# Patient Record
Sex: Male | Born: 1965 | Race: Black or African American | Hispanic: No | Marital: Married | Smoking: Former smoker
Health system: Southern US, Community
[De-identification: ages and names within clinical notes are randomized; demographics above are authoritative.]

## PROBLEM LIST (undated history)

## (undated) DIAGNOSIS — Z86018 Personal history of other benign neoplasm: Secondary | ICD-10-CM

## (undated) DIAGNOSIS — Z8669 Personal history of other diseases of the nervous system and sense organs: Secondary | ICD-10-CM

## (undated) DIAGNOSIS — G40209 Localization-related (focal) (partial) symptomatic epilepsy and epileptic syndromes with complex partial seizures, not intractable, without status epilepticus: Secondary | ICD-10-CM

## (undated) DIAGNOSIS — I639 Cerebral infarction, unspecified: Secondary | ICD-10-CM

## (undated) DIAGNOSIS — R413 Other amnesia: Secondary | ICD-10-CM

## (undated) DIAGNOSIS — N35919 Unspecified urethral stricture, male, unspecified site: Secondary | ICD-10-CM

## (undated) DIAGNOSIS — R399 Unspecified symptoms and signs involving the genitourinary system: Secondary | ICD-10-CM

## (undated) DIAGNOSIS — I10 Essential (primary) hypertension: Secondary | ICD-10-CM

## (undated) DIAGNOSIS — Z8549 Personal history of malignant neoplasm of other male genital organs: Secondary | ICD-10-CM

## (undated) DIAGNOSIS — N3281 Overactive bladder: Secondary | ICD-10-CM

## (undated) DIAGNOSIS — Z8719 Personal history of other diseases of the digestive system: Secondary | ICD-10-CM

## (undated) DIAGNOSIS — N401 Enlarged prostate with lower urinary tract symptoms: Secondary | ICD-10-CM

## (undated) DIAGNOSIS — R4701 Aphasia: Secondary | ICD-10-CM

## (undated) DIAGNOSIS — D074 Carcinoma in situ of penis: Secondary | ICD-10-CM

## (undated) DIAGNOSIS — T8859XA Other complications of anesthesia, initial encounter: Secondary | ICD-10-CM

## (undated) DIAGNOSIS — Z789 Other specified health status: Secondary | ICD-10-CM

## (undated) DIAGNOSIS — Z972 Presence of dental prosthetic device (complete) (partial): Secondary | ICD-10-CM

## (undated) DIAGNOSIS — Z973 Presence of spectacles and contact lenses: Secondary | ICD-10-CM

## (undated) DIAGNOSIS — M549 Dorsalgia, unspecified: Secondary | ICD-10-CM

## (undated) HISTORY — PX: KNEE SURGERY: SHX244

## (undated) HISTORY — DX: Overactive bladder: N32.81

## (undated) HISTORY — DX: Cerebral infarction, unspecified: I63.9

## (undated) HISTORY — DX: Dorsalgia, unspecified: M54.9

## (undated) HISTORY — DX: Personal history of malignant neoplasm of other male genital organs: Z85.49

## (undated) HISTORY — DX: Carcinoma in situ of penis: D07.4

## (undated) HISTORY — DX: Benign prostatic hyperplasia with lower urinary tract symptoms: N40.1

## (undated) HISTORY — DX: Personal history of other diseases of the digestive system: Z87.19

## (undated) HISTORY — DX: Personal history of other benign neoplasm: Z86.018

## (undated) HISTORY — DX: Other amnesia: R41.3

---

## 2001-10-11 ENCOUNTER — Encounter: Payer: Self-pay | Admitting: Emergency Medicine

## 2001-10-11 ENCOUNTER — Emergency Department (HOSPITAL_COMMUNITY): Admission: EM | Admit: 2001-10-11 | Discharge: 2001-10-12 | Payer: Self-pay | Admitting: Emergency Medicine

## 2001-10-20 ENCOUNTER — Encounter: Payer: Self-pay | Admitting: Emergency Medicine

## 2001-10-20 ENCOUNTER — Emergency Department (HOSPITAL_COMMUNITY): Admission: EM | Admit: 2001-10-20 | Discharge: 2001-10-20 | Payer: Self-pay | Admitting: Emergency Medicine

## 2001-10-21 ENCOUNTER — Emergency Department (HOSPITAL_COMMUNITY): Admission: EM | Admit: 2001-10-21 | Discharge: 2001-10-21 | Payer: Self-pay | Admitting: Emergency Medicine

## 2001-10-21 ENCOUNTER — Encounter: Payer: Self-pay | Admitting: Emergency Medicine

## 2012-11-18 ENCOUNTER — Emergency Department (HOSPITAL_COMMUNITY): Payer: No Typology Code available for payment source

## 2012-11-18 ENCOUNTER — Encounter (HOSPITAL_COMMUNITY): Payer: No Typology Code available for payment source | Admitting: Anesthesiology

## 2012-11-18 ENCOUNTER — Encounter (HOSPITAL_COMMUNITY): Payer: Self-pay | Admitting: Emergency Medicine

## 2012-11-18 ENCOUNTER — Encounter (HOSPITAL_COMMUNITY)
Admission: EM | Disposition: A | Discharge status: Home / Self Care | Payer: Self-pay | Source: Home / Self Care | Attending: Emergency Medicine

## 2012-11-18 ENCOUNTER — Emergency Department (HOSPITAL_COMMUNITY)
Admission: EM | Admit: 2012-11-18 | Discharge: 2012-11-18 | Disposition: A | Discharge status: Home / Self Care | Payer: No Typology Code available for payment source | Attending: Emergency Medicine | Admitting: Emergency Medicine

## 2012-11-18 ENCOUNTER — Emergency Department (HOSPITAL_COMMUNITY): Payer: No Typology Code available for payment source | Admitting: Anesthesiology

## 2012-11-18 DIAGNOSIS — S0120XA Unspecified open wound of nose, initial encounter: Secondary | ICD-10-CM | POA: Insufficient documentation

## 2012-11-18 DIAGNOSIS — S025XXA Fracture of tooth (traumatic), initial encounter for closed fracture: Secondary | ICD-10-CM | POA: Insufficient documentation

## 2012-11-18 DIAGNOSIS — S0992XA Unspecified injury of nose, initial encounter: Secondary | ICD-10-CM

## 2012-11-18 DIAGNOSIS — S52599A Other fractures of lower end of unspecified radius, initial encounter for closed fracture: Secondary | ICD-10-CM | POA: Insufficient documentation

## 2012-11-18 DIAGNOSIS — IMO0002 Reserved for concepts with insufficient information to code with codable children: Secondary | ICD-10-CM | POA: Insufficient documentation

## 2012-11-18 DIAGNOSIS — I10 Essential (primary) hypertension: Secondary | ICD-10-CM | POA: Insufficient documentation

## 2012-11-18 DIAGNOSIS — Y998 Other external cause status: Secondary | ICD-10-CM | POA: Insufficient documentation

## 2012-11-18 DIAGNOSIS — S022XXB Fracture of nasal bones, initial encounter for open fracture: Secondary | ICD-10-CM | POA: Insufficient documentation

## 2012-11-18 HISTORY — DX: Essential (primary) hypertension: I10

## 2012-11-18 HISTORY — PX: NASAL RECONSTRUCTION WITH SEPTAL REPAIR: SHX5665

## 2012-11-18 SURGERY — RECONSTRUCTION, NOSE, WITH NASAL SEPTUM REPAIR
Anesthesia: General | Site: Nose | Wound class: Clean Contaminated

## 2012-11-18 MED ORDER — MUPIROCIN CALCIUM 2 % NA OINT
TOPICAL_OINTMENT | NASAL | Status: DC | PRN
  Administered 2012-11-18: 1 via NASAL

## 2012-11-18 MED ORDER — MIDAZOLAM HCL 5 MG/5ML IJ SOLN
INTRAMUSCULAR | Status: DC | PRN
  Administered 2012-11-18: 2 mg via INTRAVENOUS

## 2012-11-18 MED ORDER — ONDANSETRON HCL 4 MG/2ML IJ SOLN
INTRAMUSCULAR | Status: DC | PRN
  Administered 2012-11-18: 4 mg via INTRAVENOUS

## 2012-11-18 MED ORDER — DEXAMETHASONE SODIUM PHOSPHATE 4 MG/ML IJ SOLN
INTRAMUSCULAR | Status: DC | PRN
  Administered 2012-11-18: 4 mg via INTRAVENOUS

## 2012-11-18 MED ORDER — SUCCINYLCHOLINE CHLORIDE 20 MG/ML IJ SOLN
INTRAMUSCULAR | Status: DC | PRN
  Administered 2012-11-18: 100 mg via INTRAVENOUS

## 2012-11-18 MED ORDER — EPHEDRINE SULFATE 50 MG/ML IJ SOLN
INTRAMUSCULAR | Status: DC | PRN
  Administered 2012-11-18: 10 mg via INTRAVENOUS

## 2012-11-18 MED ORDER — OXYCODONE HCL 5 MG/5ML PO SOLN: 5.0000 mg | Freq: Once | ORAL | Status: DC | PRN

## 2012-11-18 MED ORDER — LIDOCAINE-EPINEPHRINE 1 %-1:100000 IJ SOLN
INTRAMUSCULAR | Status: AC
  Filled 2012-11-18: qty 1

## 2012-11-18 MED ORDER — LIDOCAINE HCL (CARDIAC) 20 MG/ML IV SOLN
INTRAVENOUS | Status: DC | PRN
  Administered 2012-11-18: 100 mg via INTRAVENOUS

## 2012-11-18 MED ORDER — HYDROMORPHONE HCL PF 1 MG/ML IJ SOLN: 0.2500 mg | INTRAMUSCULAR | Status: DC | PRN

## 2012-11-18 MED ORDER — SODIUM CHLORIDE 0.9 % IR SOLN
Status: DC | PRN
  Administered 2012-11-18: 1

## 2012-11-18 MED ORDER — PROMETHAZINE HCL 25 MG/ML IJ SOLN: 6.2500 mg | INTRAMUSCULAR | Status: DC | PRN

## 2012-11-18 MED ORDER — OXYMETAZOLINE HCL 0.05 % NA SOLN
NASAL | Status: AC
  Filled 2012-11-18: qty 15

## 2012-11-18 MED ORDER — OXYMETAZOLINE HCL 0.05 % NA SOLN
NASAL | Status: DC | PRN
  Administered 2012-11-18: 1 via NASAL

## 2012-11-18 MED ORDER — FENTANYL CITRATE 0.05 MG/ML IJ SOLN
100.0000 ug | Freq: Once | INTRAMUSCULAR | Status: AC
  Administered 2012-11-18: 100 ug via INTRAVENOUS
  Filled 2012-11-18: qty 2

## 2012-11-18 MED ORDER — LACTATED RINGERS IV SOLN
INTRAVENOUS | Status: DC | PRN
  Administered 2012-11-18 (×2): via INTRAVENOUS

## 2012-11-18 MED ORDER — LIDOCAINE-EPINEPHRINE 1 %-1:100000 IJ SOLN
INTRAMUSCULAR | Status: DC | PRN
  Administered 2012-11-18: 6.5 mL

## 2012-11-18 MED ORDER — PROPOFOL 10 MG/ML IV BOLUS
INTRAVENOUS | Status: DC | PRN
  Administered 2012-11-18: 200 mg via INTRAVENOUS

## 2012-11-18 MED ORDER — MUPIROCIN CALCIUM 2 % EX CREA
TOPICAL_CREAM | CUTANEOUS | Status: AC
  Filled 2012-11-18: qty 15

## 2012-11-18 MED ORDER — ONDANSETRON HCL 4 MG/2ML IJ SOLN
4.0000 mg | Freq: Once | INTRAMUSCULAR | Status: AC
  Administered 2012-11-18: 4 mg via INTRAVENOUS
  Filled 2012-11-18: qty 2

## 2012-11-18 MED ORDER — HYDROCODONE-ACETAMINOPHEN 5-325 MG PO TABS: 2.0000 | ORAL_TABLET | Freq: Four times a day (QID) | ORAL | Status: DC | PRN

## 2012-11-18 MED ORDER — EPINEPHRINE HCL 1 MG/ML IJ SOLN
INTRAMUSCULAR | Status: AC
  Filled 2012-11-18: qty 1

## 2012-11-18 MED ORDER — GLYCOPYRROLATE 0.2 MG/ML IJ SOLN
INTRAMUSCULAR | Status: DC | PRN
  Administered 2012-11-18: 0.2 mg via INTRAVENOUS

## 2012-11-18 MED ORDER — SUFENTANIL CITRATE 50 MCG/ML IV SOLN
INTRAVENOUS | Status: DC | PRN
  Administered 2012-11-18 (×2): 10 ug via INTRAVENOUS

## 2012-11-18 MED ORDER — CEFAZOLIN SODIUM-DEXTROSE 2-3 GM-% IV SOLR
INTRAVENOUS | Status: AC
  Administered 2012-11-18: 2 g via INTRAVENOUS
  Filled 2012-11-18: qty 50

## 2012-11-18 MED ORDER — OXYCODONE HCL 5 MG PO TABS: 5.0000 mg | ORAL_TABLET | Freq: Once | ORAL | Status: DC | PRN

## 2012-11-18 MED ORDER — CEPHALEXIN 500 MG PO CAPS: 500.0000 mg | ORAL_CAPSULE | Freq: Three times a day (TID) | ORAL | Status: DC

## 2012-11-18 SURGICAL SUPPLY — 41 items
ATTRACTOMAT 16X20 MAGNETIC DRP (DRAPES) IMPLANT
BLADE TRICUT ROTATE M4 4 5PK (BLADE) IMPLANT
CANISTER SUCTION 2500CC (MISCELLANEOUS) ×2 IMPLANT
CLOTH BEACON ORANGE TIMEOUT ST (SAFETY) ×2 IMPLANT
CLSR STERI-STRIP ANTIMIC 1/2X4 (GAUZE/BANDAGES/DRESSINGS) ×1 IMPLANT
COAGULATOR SUCT SWTCH 10FR 6 (ELECTROSURGICAL) IMPLANT
COVER MAYO STAND STRL (DRAPES) IMPLANT
CRADLE DONUT ADULT HEAD (MISCELLANEOUS) IMPLANT
DECANTER SPIKE VIAL GLASS SM (MISCELLANEOUS) ×2 IMPLANT
DRESSING NASAL KENNEDY 3.5X.9 (MISCELLANEOUS) IMPLANT
DRESSING TELFA 8X3 (GAUZE/BANDAGES/DRESSINGS) IMPLANT
DRSG NASAL KENNEDY 3.5X.9 (MISCELLANEOUS) ×4
ELECT COATED BLADE 2.86 ST (ELECTRODE) IMPLANT
ELECT REM PT RETURN 9FT ADLT (ELECTROSURGICAL) ×2
ELECTRODE REM PT RTRN 9FT ADLT (ELECTROSURGICAL) ×1 IMPLANT
FILTER ARTHROSCOPY CONVERTOR (FILTER) IMPLANT
GLOVE BIO SURGEON STRL SZ7.5 (GLOVE) ×2 IMPLANT
GLOVE BIOGEL PI IND STRL 6.5 (GLOVE) IMPLANT
GLOVE BIOGEL PI INDICATOR 6.5 (GLOVE) ×1
GLOVE ORTHOPEDIC STR SZ6.5 (GLOVE) ×1 IMPLANT
GOWN STRL NON-REIN LRG LVL3 (GOWN DISPOSABLE) ×4 IMPLANT
IRRIGATOR 4MM STR (IRRIGATION / IRRIGATOR) IMPLANT
KIT BASIN OR (CUSTOM PROCEDURE TRAY) ×2 IMPLANT
KIT ROOM TURNOVER OR (KITS) ×2 IMPLANT
NS IRRIG 1000ML POUR BTL (IV SOLUTION) ×2 IMPLANT
PAD ARMBOARD 7.5X6 YLW CONV (MISCELLANEOUS) ×4 IMPLANT
PATTIES SURGICAL .5 X3 (DISPOSABLE) ×2 IMPLANT
PENCIL BUTTON HOLSTER BLD 10FT (ELECTRODE) ×2 IMPLANT
SPECIMEN JAR SMALL (MISCELLANEOUS) IMPLANT
SPLINT NASAL DOYLE BI-VL (GAUZE/BANDAGES/DRESSINGS) ×2 IMPLANT
SPLINT NASAL THERMO PLAST (MISCELLANEOUS) ×2 IMPLANT
STRIP CLOSURE SKIN 1/2X4 (GAUZE/BANDAGES/DRESSINGS) ×1 IMPLANT
SUT CHROMIC 5 0 P 3 (SUTURE) ×2 IMPLANT
SUT ETHILON 5 0 P 3 18 (SUTURE) ×2
SUT NYLON ETHILON 5-0 P-3 1X18 (SUTURE) IMPLANT
SUT VIC AB 4-0 P-3 18X BRD (SUTURE) IMPLANT
SUT VIC AB 4-0 P3 18 (SUTURE) ×4
TOWEL OR 17X24 6PK STRL BLUE (TOWEL DISPOSABLE) ×2 IMPLANT
TOWEL OR 17X26 10 PK STRL BLUE (TOWEL DISPOSABLE) ×2 IMPLANT
TRAY ENT MC OR (CUSTOM PROCEDURE TRAY) ×2 IMPLANT
WATER STERILE IRR 1000ML POUR (IV SOLUTION) ×2 IMPLANT
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
## 2012-11-18 NOTE — Anesthesia Preprocedure Evaluation (Addendum)
Anesthesia Evaluation  Patient identified by MRN, date of birth, ID band Patient awake    Reviewed: Allergy & Precautions, H&P , NPO status , Patient's Chart, lab work & pertinent test results  Airway Mallampati: II TM Distance: >3 FB Neck ROM: Full    Dental   Pulmonary former smoker,  breath sounds clear to auscultation        Cardiovascular hypertension, Rhythm:Regular Rate:Tachycardia     Neuro/Psych    GI/Hepatic   Endo/Other    Renal/GU      Musculoskeletal   Abdominal (+) + obese,   Peds  Hematology   Anesthesia Other Findings Facial trauma  Reproductive/Obstetrics                          Anesthesia Physical Anesthesia Plan  ASA: II and emergent  Anesthesia Plan: General   Post-op Pain Management:    Induction: Intravenous, Rapid sequence and Cricoid pressure planned  Airway Management Planned: Oral ETT  Additional Equipment:   Intra-op Plan:   Post-operative Plan: Extubation in OR  Informed Consent: I have reviewed the patients History and Physical, chart, labs and discussed the procedure including the risks, benefits and alternatives for the proposed anesthesia with the patient or authorized representative who has indicated his/her understanding and acceptance.     Plan Discussed with: CRNA and Surgeon  Anesthesia Plan Comments: (Oral rae OET)       Anesthesia Quick Evaluation

## 2012-11-18 NOTE — ED Notes (Signed)
Chaplain contacted patient's girlfriend.

## 2012-11-18 NOTE — Transfer of Care (Signed)
Immediate Anesthesia Transfer of Care Note  Patient: Kyle Chan  Procedure(s) Performed: Procedure(s): NASAL RECONSTRUCTION WITH SEPTAL REPAIR (N/A)  Patient Location: PACU  Anesthesia Type:General  Level of Consciousness: awake, alert , patient cooperative and responds to stimulation  Airway & Oxygen Therapy: Patient Spontanous Breathing and Patient connected to face mask oxygen  Post-op Assessment: Report given to PACU RN, Post -op Vital signs reviewed and stable and Patient moving all extremities X 4  Post vital signs: Reviewed and stable  Complications: No apparent anesthesia complications
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
## 2012-11-18 NOTE — ED Notes (Signed)
Patient is resting comfortably. 

## 2012-11-18 NOTE — Anesthesia Postprocedure Evaluation (Signed)
  Anesthesia Post-op Note  Patient: Kyle Chan  Procedure(s) Performed: Procedure(s): NASAL RECONSTRUCTION WITH SEPTAL REPAIR (N/A)  Patient Location: PACU  Anesthesia Type:General  Level of Consciousness: awake and alert   Airway and Oxygen Therapy: Patient Spontanous Breathing  Post-op Pain: mild  Post-op Assessment: Post-op Vital signs reviewed, Patient's Cardiovascular Status Stable, Respiratory Function Stable, Patent Airway, No signs of Nausea or vomiting and Pain level controlled  Post-op Vital Signs: Reviewed and stable  Complications: No apparent anesthesia complications

## 2012-11-18 NOTE — Brief Op Note (Signed)
11/18/2012  6:08 PM  PATIENT:  Kyle Chan  47 y.o. male  PRE-OPERATIVE DIAGNOSIS:  complex nasal injury  POST-OPERATIVE DIAGNOSIS:  complex nasal injury  PROCEDURE:  Complex closure nasal laceration, 20 cm Closed nasal reduction  SURGEON:  Surgeon(s) and Role:    * Christia Reading, MD - Primary  PHYSICIAN ASSISTANT:   ASSISTANTS: none   ANESTHESIA:   general  EBL:  Total I/O In: 2000 [I.V.:2000] Out: 400 [Urine:400]  BLOOD ADMINISTERED:none  DRAINS: none   LOCAL MEDICATIONS USED:  LIDOCAINE   SPECIMEN:  No Specimen  DISPOSITION OF SPECIMEN:  N/A  COUNTS:  YES  TOURNIQUET:  * No tourniquets in log *  DICTATION: .Other Dictation: Dictation Number [id number]  PLAN OF CARE: Discharge to home after PACU  PATIENT DISPOSITION:  PACU - hemodynamically stable.   Delay start of Pharmacological VTE agent (>24hrs) due to surgical blood loss or risk of bleeding: no
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
## 2012-11-18 NOTE — ED Notes (Signed)
Report given to OR.

## 2012-11-18 NOTE — ED Notes (Signed)
Family at bedside. 

## 2012-11-19 ENCOUNTER — Encounter (HOSPITAL_COMMUNITY): Payer: Self-pay | Admitting: Otolaryngology
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
## 2012-11-25 ENCOUNTER — Encounter (HOSPITAL_COMMUNITY): Payer: Self-pay | Admitting: Emergency Medicine

## 2012-11-25 ENCOUNTER — Emergency Department (HOSPITAL_COMMUNITY)
Admission: EM | Admit: 2012-11-25 | Discharge: 2012-11-25 | Disposition: A | Discharge status: Home / Self Care | Payer: No Typology Code available for payment source | Attending: Emergency Medicine | Admitting: Emergency Medicine

## 2012-11-25 ENCOUNTER — Emergency Department (HOSPITAL_COMMUNITY): Payer: No Typology Code available for payment source

## 2012-11-25 DIAGNOSIS — S43431A Superior glenoid labrum lesion of right shoulder, initial encounter: Secondary | ICD-10-CM

## 2012-11-25 DIAGNOSIS — Z9889 Other specified postprocedural states: Secondary | ICD-10-CM | POA: Insufficient documentation

## 2012-11-25 DIAGNOSIS — Y9355 Activity, bike riding: Secondary | ICD-10-CM | POA: Insufficient documentation

## 2012-11-25 DIAGNOSIS — Z79899 Other long term (current) drug therapy: Secondary | ICD-10-CM | POA: Insufficient documentation

## 2012-11-25 DIAGNOSIS — S43439A Superior glenoid labrum lesion of unspecified shoulder, initial encounter: Secondary | ICD-10-CM | POA: Insufficient documentation

## 2012-11-25 DIAGNOSIS — I10 Essential (primary) hypertension: Secondary | ICD-10-CM | POA: Insufficient documentation

## 2012-11-25 DIAGNOSIS — Z87891 Personal history of nicotine dependence: Secondary | ICD-10-CM | POA: Insufficient documentation

## 2012-11-25 DIAGNOSIS — Y92410 Unspecified street and highway as the place of occurrence of the external cause: Secondary | ICD-10-CM | POA: Insufficient documentation
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
## 2012-11-25 NOTE — ED Provider Notes (Signed)
Medical screening examination/treatment/procedure(s) were performed by non-physician practitioner and as supervising physician I was immediately available for consultation/collaboration.  EKG Interpretation   None        Doug Sou, MD 11/25/12 1549

## 2013-05-05 ENCOUNTER — Encounter (HOSPITAL_COMMUNITY): Payer: Self-pay | Admitting: Emergency Medicine

## 2013-05-05 ENCOUNTER — Emergency Department (HOSPITAL_COMMUNITY)
Admission: EM | Admit: 2013-05-05 | Discharge: 2013-05-06 | Disposition: A | Discharge status: Home / Self Care | Payer: 59 | Attending: Emergency Medicine | Admitting: Emergency Medicine

## 2013-05-05 DIAGNOSIS — R51 Headache: Secondary | ICD-10-CM | POA: Insufficient documentation

## 2013-05-05 DIAGNOSIS — IMO0001 Reserved for inherently not codable concepts without codable children: Secondary | ICD-10-CM | POA: Insufficient documentation

## 2013-05-05 DIAGNOSIS — Z8719 Personal history of other diseases of the digestive system: Secondary | ICD-10-CM | POA: Insufficient documentation

## 2013-05-05 DIAGNOSIS — R1013 Epigastric pain: Secondary | ICD-10-CM | POA: Insufficient documentation

## 2013-05-05 DIAGNOSIS — I10 Essential (primary) hypertension: Secondary | ICD-10-CM | POA: Insufficient documentation

## 2013-05-05 DIAGNOSIS — R059 Cough, unspecified: Secondary | ICD-10-CM | POA: Insufficient documentation

## 2013-05-05 DIAGNOSIS — R05 Cough: Secondary | ICD-10-CM | POA: Insufficient documentation

## 2013-05-05 DIAGNOSIS — R112 Nausea with vomiting, unspecified: Secondary | ICD-10-CM

## 2013-05-05 DIAGNOSIS — Z87891 Personal history of nicotine dependence: Secondary | ICD-10-CM | POA: Insufficient documentation

## 2013-05-05 DIAGNOSIS — R197 Diarrhea, unspecified: Secondary | ICD-10-CM | POA: Insufficient documentation

## 2013-05-05 DIAGNOSIS — Z79899 Other long term (current) drug therapy: Secondary | ICD-10-CM | POA: Insufficient documentation

## 2013-05-05 DIAGNOSIS — Z7982 Long term (current) use of aspirin: Secondary | ICD-10-CM | POA: Insufficient documentation

## 2013-05-05 LAB — COMPREHENSIVE METABOLIC PANEL
ALBUMIN: 3.9 g/dL (ref 3.5–5.2)
ALK PHOS: 68 U/L (ref 39–117)
ALT: 26 U/L (ref 0–53)
AST: 21 U/L (ref 0–37)
BUN: 14 mg/dL (ref 6–23)
CHLORIDE: 104 meq/L (ref 96–112)
CO2: 24 meq/L (ref 19–32)
Calcium: 9.2 mg/dL (ref 8.4–10.5)
Creatinine, Ser: 0.99 mg/dL (ref 0.50–1.35)
GFR calc Af Amer: >90 mL/min (ref >90)
GFR calc non Af Amer: >90 mL/min (ref >90)
Glucose, Bld: 108 mg/dL — ABNORMAL HIGH (ref 70–99)
POTASSIUM: 3.6 meq/L — AB (ref 3.7–5.3)
Sodium: 141 mEq/L (ref 137–147)
Total Bilirubin: 0.2 mg/dL — ABNORMAL LOW (ref 0.3–1.2)
Total Protein: 7.5 g/dL (ref 6.0–8.3)

## 2013-05-05 LAB — CBC WITH DIFFERENTIAL/PLATELET
BASOS ABS: 0 10*3/uL (ref 0.0–0.1)
Basophils Relative: 0 % (ref 0–1)
Eosinophils Absolute: 0.1 10*3/uL (ref 0.0–0.7)
Eosinophils Relative: 1 % (ref 0–5)
HEMATOCRIT: 46.2 % (ref 39.0–52.0)
Hemoglobin: 16.5 g/dL (ref 13.0–17.0)
LYMPHS ABS: 1.8 10*3/uL (ref 0.7–4.0)
Lymphocytes Relative: 20 % (ref 12–46)
MCH: 31.9 pg (ref 26.0–34.0)
MCHC: 35.7 g/dL (ref 30.0–36.0)
MCV: 89.2 fL (ref 78.0–100.0)
MONO ABS: 0.9 10*3/uL (ref 0.1–1.0)
MONOS PCT: 10 % (ref 3–12)
NEUTROS ABS: 6.3 10*3/uL (ref 1.7–7.7)
Neutrophils Relative %: 69 % (ref 43–77)
Platelets: 203 10*3/uL (ref 150–400)
RBC: 5.18 MIL/uL (ref 4.22–5.81)
RDW: 13.7 % (ref 11.5–15.5)
WBC: 9.1 10*3/uL (ref 4.0–10.5)

## 2013-05-05 LAB — LIPASE, BLOOD: Lipase: 21 U/L (ref 11–59)

## 2013-05-05 MED ORDER — ONDANSETRON 4 MG PO TBDP: 4.0000 mg | ORAL_TABLET | Freq: Three times a day (TID) | ORAL | Status: DC | PRN

## 2013-05-05 MED ORDER — GI COCKTAIL ~~LOC~~
30.0000 mL | Freq: Once | ORAL | Status: AC
  Administered 2013-05-05: 30 mL via ORAL
  Filled 2013-05-05: qty 30

## 2013-05-05 MED ORDER — ONDANSETRON HCL 4 MG/2ML IJ SOLN
4.0000 mg | Freq: Once | INTRAMUSCULAR | Status: AC
  Administered 2013-05-05: 4 mg via INTRAVENOUS
  Filled 2013-05-05: qty 2

## 2013-05-05 MED ORDER — SODIUM CHLORIDE 0.9 % IV BOLUS (SEPSIS)
1000.0000 mL | Freq: Once | INTRAVENOUS | Status: AC
  Administered 2013-05-05: 1000 mL via INTRAVENOUS

## 2013-05-05 NOTE — ED Notes (Signed)
Pt complains of diarrhea and vomiting since Friday

## 2013-05-05 NOTE — Discharge Instructions (Signed)
Take nausea medication as directed. Follow up with your doctor in 2 days for further evaluation should symptoms persist. Return to Emergency deparment if you develop worsening abdominal pain, Vomiting, or unable to tolerate fluids.    Nausea and Vomiting Nausea means you feel sick to your stomach. Throwing up (vomiting) is a reflex where stomach contents come out of your mouth. HOME CARE   Take medicine as told by your doctor.  Do not force yourself to eat. However, you do need to drink fluids.  If you feel like eating, eat a normal diet as told by your doctor.  Eat rice, wheat, potatoes, bread, lean meats, yogurt, fruits, and vegetables.  Avoid high-fat foods.  Drink enough fluids to keep your pee (urine) clear or pale yellow.  Ask your doctor how to replace body fluid losses (rehydrate). Signs of body fluid loss (dehydration) include:  Feeling very thirsty.  Dry lips and mouth.  Feeling dizzy.  Dark pee.  Peeing less than normal.  Feeling confused.  Fast breathing or heart rate. GET HELP RIGHT AWAY IF:   You have blood in your throw up.  You have black or bloody poop (stool).  You have a bad headache or stiff neck.  You feel confused.  You have bad belly (abdominal) pain.  You have chest pain or trouble breathing.  You do not pee at least once every 8 hours.  You have cold, clammy skin.  You keep throwing up after 24 to 48 hours.  You have a fever. MAKE SURE YOU:   Understand these instructions.  Will watch your condition.  Will get help right away if you are not doing well or get worse. Document Released: 06/29/2007 Document Revised: 04/04/2011 Document Reviewed: 06/11/2010 [facility] Patient Information 2014 [place], [place].  Diarrhea Diarrhea is watery poop (stool). It can make you feel weak, tired, thirsty, or give you a dry mouth (signs of dehydration). Watery poop is a sign of another problem, most often an infection. It often lasts 2 3 days. It  can last longer if it is a sign of something serious. Take care of yourself as told by your doctor. HOME CARE   Drink 1 cup (8 ounces) of fluid each time you have watery poop.  Do not drink the following fluids:  Those that contain simple sugars (fructose, glucose, galactose, lactose, sucrose, maltose).  Sports drinks.  Fruit juices.  Whole milk products.  Sodas.  Drinks with caffeine (coffee, tea, soda) or alcohol.  Oral rehydration solution may be used if the doctor says it is okay. You may make your own solution. Follow this recipe:    teaspoon table salt.   teaspoon baking soda.   teaspoon salt substitute containing potassium chloride.  1 tablespoons sugar.  1 liter (34 ounces) of water.  Avoid the following foods:  High fiber foods, such as raw fruits and vegetables.  Nuts, seeds, and whole grain breads and cereals.   Those that are sweetened with sugar alcohols (xylitol, sorbitol, mannitol).  Try eating the following foods:  Starchy foods, such as rice, toast, pasta, low-sugar cereal, oatmeal, baked potatoes, crackers, and bagels.  Bananas.  Applesauce.  Eat probiotic-rich foods, such as yogurt and milk products that are fermented.  Wash your hands well after each time you have watery poop.  Only take medicine as told by your doctor.  Take a warm bath to help lessen burning or pain from having watery poop. GET HELP RIGHT AWAY IF:   You cannot drink fluids  without throwing up (vomiting).  You keep throwing up.  You have blood in your poop, or your poop looks black and tarry.  You do not pee (urinate) in 6 8 hours, or there is only a small amount of very dark pee.  You have belly (abdominal) pain that gets worse or stays in the same spot (localizes).  You are weak, dizzy, confused, or lightheaded.  You have a very bad headache.  Your watery poop gets worse or does not get better.  You have a fever or lasting symptoms for more than 2 3  days.  You have a fever and your symptoms suddenly get worse. MAKE SURE YOU:   Understand these instructions.  Will watch your condition.  Will get help right away if you are not doing well or get worse. Document Released: 06/29/2007 Document Revised: 10/05/2011 Document Reviewed: 09/18/2011 [facility] Patient Information 2014 [place], [place].

## 2013-05-05 NOTE — ED Provider Notes (Signed)
CSN: [id number]     Arrival date & time 05/05/13  2020 History   First MD Initiated Contact with Patient 05/05/13 2136     Chief Complaint  Patient presents with  . Emesis  . Diarrhea     (Consider location/radiation/quality/duration/timing/severity/associated sxs/prior Treatment) HPI 48 yo male with hx of Diverticulitis back in 2003 presents with 3 days of watery diarrhea x 8 episodes since Friday and 1 episode of N/V prior to presentation to ED. Patient Admits to Fever/chills at home Tmax of 100.3 recorded at home. Patient has been taking tylenol for fevers. Admits to epigastric burning pain that is constant since Friday.  Also admits to Dry cough, mild HAs, and Myalgias.  Denies any chest pain or shortness of breath. Patient denies any bloody stools or urinary sxs.  No hx of abdominal surgeries.  CAD Risk Factors: HTN: Yes Hyperlipidemia: No Cigarette smoking: No Diabetes Mellitus: No Family hx of CAD or MI < 50 yo : No Cocaine Use: No    Past Medical History  Diagnosis Date  . Hypertension    Past Surgical History  Procedure Laterality Date  . Nasal reconstruction with septal repair N/A 11/18/2012    Procedure: NASAL RECONSTRUCTION WITH SEPTAL REPAIR;  Surgeon: Melida Quitter, MD;  Location: [place];  Service: ENT;  Laterality: N/A;   History reviewed. No pertinent family history. History  Substance Use Topics  . Smoking status: Former Smoker    Types: Cigarettes  . Smokeless tobacco: Not on file  . Alcohol Use: Yes     Comment: occ    Review of Systems  All other systems reviewed and are negative.     Allergies  Review of patient's allergies indicates no known allergies.  Home Medications   Current Outpatient Rx  Name  Route  Sig  Dispense  Refill  . acetaminophen (TYLENOL) 500 MG tablet   Oral   Take 1,000 mg by mouth every 6 (six) hours as needed (fever).         Marland Kitchen amLODipine (NORVASC) 10 MG tablet   Oral   Take 10 mg by mouth daily.          Marland Kitchen  aspirin EC 81 MG tablet   Oral   Take 81 mg by mouth daily.         . diazepam (VALIUM) 5 MG tablet   Oral   Take 5 mg by mouth every 6 (six) hours as needed.          Marland Kitchen omega-3 acid ethyl esters (LOVAZA) 1 G capsule   Oral   Take 1 g by mouth daily.         . vitamin B-12 (CYANOCOBALAMIN) 100 MCG tablet   Oral   Take 250 mcg by mouth daily.         . vitamin C (ASCORBIC ACID) 500 MG tablet   Oral   Take 500 mg by mouth daily.         . ondansetron (ZOFRAN ODT) 4 MG disintegrating tablet   Oral   Take 1 tablet (4 mg total) by mouth every 8 (eight) hours as needed for nausea.   10 tablet   0    BP 124/68  Pulse 51  Temp(Src) 98.4 F (36.9 C) (Oral)  Resp 18  SpO2 97% Physical Exam  Nursing note and vitals reviewed. Constitutional: He is oriented to person, place, and time. He appears well-developed and well-nourished. No distress.  HENT:  Head: Normocephalic and atraumatic.  Eyes:  Conjunctivae are normal. No scleral icterus.  Neck: Normal range of motion. Neck supple. No JVD present. No tracheal deviation present.  Cardiovascular: Normal rate and regular rhythm.  Exam reveals no gallop and no friction rub.   No murmur heard. Pulmonary/Chest: Effort normal and breath sounds normal. No respiratory distress. He has no wheezes. He has no rhonchi. He has no rales.  Abdominal: Soft. Bowel sounds are normal. He exhibits no distension. There is no hepatosplenomegaly. There is no tenderness. There is no rigidity, no rebound, no guarding, no tenderness at McBurney's point and negative Murphy's sign.  Musculoskeletal: Normal range of motion. He exhibits no edema.  Neurological: He is alert and oriented to person, place, and time.  Skin: Skin is warm and dry. He is not diaphoretic.  Psychiatric: He has a normal mood and affect. His behavior is normal.    ED Course  Procedures (including critical care time) Labs Review Labs Reviewed  COMPREHENSIVE METABOLIC PANEL -  Abnormal; Notable for the following:    Potassium 3.6 (*)    Glucose, Bld 108 (*)    Total Bilirubin 0.2 (*)    All other components within normal limits  LIPASE, BLOOD  CBC WITH DIFFERENTIAL   Imaging Review No results found.   EKG Interpretation None      MDM   Final diagnoses:  Nausea vomiting and diarrhea  Epigastric abdominal pain   Filed Vitals:   05/05/13 2022 05/06/13 0024  BP: 150/86 124/68  Pulse: 78 51  Temp: 98.4 F (36.9 C) 98.4 F (36.9 C)  TempSrc: Oral Oral  Resp: 20 18  SpO2: 97% 97%   Patient afebrile with stable vital signs.  Abdominal exam reveal soft nontender abdomen. No hx of abdominal surgery. Doubt obstruction.  Doubt cholecystitis or appendicitis. Lipase negative. Pancreatitis unlikely.  Patient has minimal CAD risk factors, doubt ACS.  CBC and CMP appear to be within normal limits.  Patient has hx of Diverticulitis, but no abdominal pain at this time.  Patient improved after treatement in ED. Epigastric pain resolved with GI cocktail. Patient now tolerating POs after nausea medication. Plan to treat patient symptomatically. Discussed possibility of early diverticulitis as well as other less emergent causes of symptoms. Patient given strict return precautions for worsening abdominal pain, vomiting, fever, and bloody stools. Plan to have patient follow up with PCP in 2 days if symptoms not improving. Recommend OTC zantac or omeprazole for GERD type symptoms. Patient confirms understanding and agrees with plan. Discharged in good condition.    Meds given in ED:  Medications  ondansetron (ZOFRAN) injection 4 mg (4 mg Intravenous Given 05/05/13 2223)  sodium chloride 0.9 % bolus 1,000 mL (1,000 mLs Intravenous New Bag/Given 05/05/13 2220)  gi cocktail (Maalox,Lidocaine,Donnatal) (30 mLs Oral Given 05/05/13 2319)    Discharge Medication List as of 05/05/2013 11:58 PM    START taking these medications   Details  ondansetron (ZOFRAN ODT) 4 MG  disintegrating tablet Take 1 tablet (4 mg total) by mouth every 8 (eight) hours as needed for nausea., Starting 05/05/2013, Until Discontinued, Print         Sherrie George, PA-C 05/06/13 1431

## 2013-05-05 NOTE — ED Notes (Signed)
Pt is aware of the need for urine, however states he cannot provide one at this time. Urinal at bedside.

## 2013-05-09 NOTE — ED Provider Notes (Signed)
Medical screening examination/treatment/procedure(s) were conducted as a shared visit with non-physician practitioner(s) and myself.  I personally evaluated the patient during the encounter    .Face to face Exam:  General:  A&Ox3 HEENT:  Atraumatic Resp:  Normal effort Abd:  Nondistended Neuro:No focal deficits     Dot Lanes, MD 05/09/13 1640

## 2014-02-03 ENCOUNTER — Other Ambulatory Visit: Payer: Self-pay | Admitting: Family Medicine

## 2014-02-03 DIAGNOSIS — M25561 Pain in right knee: Secondary | ICD-10-CM

## 2014-02-04 ENCOUNTER — Ambulatory Visit
Admission: RE | Admit: 2014-02-04 | Discharge: 2014-02-04 | Disposition: A | Discharge status: Home / Self Care | Payer: PRIVATE HEALTH INSURANCE | Source: Ambulatory Visit | Attending: Family Medicine | Admitting: Family Medicine

## 2014-02-04 DIAGNOSIS — M25561 Pain in right knee: Secondary | ICD-10-CM

## 2014-02-08 ENCOUNTER — Other Ambulatory Visit: Payer: Self-pay
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
## 2016-02-25 DIAGNOSIS — R4701 Aphasia: Secondary | ICD-10-CM

## 2016-02-25 DIAGNOSIS — Z86011 Personal history of benign neoplasm of the brain: Secondary | ICD-10-CM

## 2016-02-25 HISTORY — DX: Aphasia: R47.01

## 2016-02-25 HISTORY — DX: Personal history of benign neoplasm of the brain: Z86.011

## 2016-03-02 ENCOUNTER — Emergency Department (HOSPITAL_COMMUNITY): Payer: Commercial Managed Care - HMO

## 2016-03-02 ENCOUNTER — Encounter (HOSPITAL_COMMUNITY): Payer: Self-pay | Admitting: Radiology

## 2016-03-02 ENCOUNTER — Inpatient Hospital Stay (HOSPITAL_COMMUNITY)
Admission: EM | Admit: 2016-03-02 | Discharge: 2016-03-06 | DRG: 026 | Disposition: A | Discharge status: Home / Self Care | Payer: Commercial Managed Care - HMO | Attending: Neurological Surgery | Admitting: Neurological Surgery

## 2016-03-02 DIAGNOSIS — R4701 Aphasia: Secondary | ICD-10-CM | POA: Diagnosis present

## 2016-03-02 DIAGNOSIS — G939 Disorder of brain, unspecified: Secondary | ICD-10-CM

## 2016-03-02 DIAGNOSIS — Z87891 Personal history of nicotine dependence: Secondary | ICD-10-CM

## 2016-03-02 DIAGNOSIS — R29704 NIHSS score 4: Secondary | ICD-10-CM | POA: Diagnosis present

## 2016-03-02 DIAGNOSIS — I639 Cerebral infarction, unspecified: Secondary | ICD-10-CM | POA: Diagnosis not present

## 2016-03-02 DIAGNOSIS — Z7982 Long term (current) use of aspirin: Secondary | ICD-10-CM | POA: Diagnosis not present

## 2016-03-02 DIAGNOSIS — D329 Benign neoplasm of meninges, unspecified: Principal | ICD-10-CM

## 2016-03-02 DIAGNOSIS — Z9889 Other specified postprocedural states: Secondary | ICD-10-CM

## 2016-03-02 DIAGNOSIS — G9389 Other specified disorders of brain: Secondary | ICD-10-CM

## 2016-03-02 DIAGNOSIS — I10 Essential (primary) hypertension: Secondary | ICD-10-CM | POA: Diagnosis present

## 2016-03-02 LAB — COMPREHENSIVE METABOLIC PANEL
ALBUMIN: 4.4 g/dL (ref 3.5–5.0)
ALK PHOS: 62 U/L (ref 38–126)
ALT: 41 U/L (ref 17–63)
AST: 30 U/L (ref 15–41)
Anion gap: 10 (ref 5–15)
BUN: 9 mg/dL (ref 6–20)
CALCIUM: 9.5 mg/dL (ref 8.9–10.3)
CO2: 21 mmol/L — AB (ref 22–32)
CREATININE: 0.95 mg/dL (ref 0.61–1.24)
Chloride: 107 mmol/L (ref 101–111)
GFR calc Af Amer: >60 mL/min (ref >60)
GFR calc non Af Amer: >60 mL/min (ref >60)
GLUCOSE: 126 mg/dL — AB (ref 65–99)
Potassium: 3.8 mmol/L (ref 3.5–5.1)
SODIUM: 138 mmol/L (ref 135–145)
Total Bilirubin: 0.5 mg/dL (ref 0.3–1.2)
Total Protein: 7.2 g/dL (ref 6.5–8.1)

## 2016-03-02 LAB — DIFFERENTIAL
Basophils Absolute: 0 10*3/uL (ref 0.0–0.1)
Basophils Relative: 0 %
Eosinophils Absolute: 0 10*3/uL (ref 0.0–0.7)
Eosinophils Relative: 0 %
LYMPHS ABS: 1.9 10*3/uL (ref 0.7–4.0)
LYMPHS PCT: 15 %
Monocytes Absolute: 0.7 10*3/uL (ref 0.1–1.0)
Monocytes Relative: 5 %
NEUTROS ABS: 10.1 10*3/uL — AB (ref 1.7–7.7)
Neutrophils Relative %: 80 %

## 2016-03-02 LAB — CBC
HCT: 44.9 % (ref 39.0–52.0)
HEMOGLOBIN: 16 g/dL (ref 13.0–17.0)
MCH: 31.4 pg (ref 26.0–34.0)
MCHC: 35.6 g/dL (ref 30.0–36.0)
MCV: 88 fL (ref 78.0–100.0)
PLATELETS: 225 10*3/uL (ref 150–400)
RBC: 5.1 MIL/uL (ref 4.22–5.81)
RDW: 13.6 % (ref 11.5–15.5)
WBC: 12.7 10*3/uL — AB (ref 4.0–10.5)

## 2016-03-02 LAB — URINALYSIS, ROUTINE W REFLEX MICROSCOPIC
BACTERIA UA: NONE SEEN
BILIRUBIN URINE: NEGATIVE
Glucose, UA: NEGATIVE mg/dL
Hgb urine dipstick: NEGATIVE
Ketones, ur: 5 mg/dL — AB
NITRITE: NEGATIVE
PH: 9 — AB (ref 5.0–8.0)
Protein, ur: NEGATIVE mg/dL
SPECIFIC GRAVITY, URINE: 1.015 (ref 1.005–1.030)

## 2016-03-02 LAB — RAPID URINE DRUG SCREEN, HOSP PERFORMED
Amphetamines: NOT DETECTED
Barbiturates: NOT DETECTED
Benzodiazepines: NOT DETECTED
Cocaine: NOT DETECTED
Opiates: NOT DETECTED
Tetrahydrocannabinol: NOT DETECTED

## 2016-03-02 LAB — I-STAT CHEM 8, ED
BUN: 12 mg/dL (ref 6–20)
CALCIUM ION: 1.06 mmol/L — AB (ref 1.15–1.40)
CHLORIDE: 108 mmol/L (ref 101–111)
CREATININE: 0.9 mg/dL (ref 0.61–1.24)
Glucose, Bld: 125 mg/dL — ABNORMAL HIGH (ref 65–99)
HEMATOCRIT: 48 % (ref 39.0–52.0)
Hemoglobin: 16.3 g/dL (ref 13.0–17.0)
Potassium: 4 mmol/L (ref 3.5–5.1)
SODIUM: 142 mmol/L (ref 135–145)
TCO2: 24 mmol/L (ref 0–100)

## 2016-03-02 LAB — PROTIME-INR
INR: 0.99
PROTHROMBIN TIME: 13.1 s (ref 11.4–15.2)

## 2016-03-02 LAB — CBG MONITORING, ED: Glucose-Capillary: 112 mg/dL — ABNORMAL HIGH (ref 65–99)

## 2016-03-02 LAB — APTT: aPTT: 26 seconds (ref 24–36)

## 2016-03-02 LAB — I-STAT TROPONIN, ED: Troponin i, poc: 0 ng/mL (ref 0.00–0.08)

## 2016-03-02 LAB — ETHANOL: Alcohol, Ethyl (B): <5 mg/dL (ref <5)

## 2016-03-02 MED ORDER — SODIUM CHLORIDE 0.9% FLUSH
3.0000 mL | Freq: Two times a day (BID) | INTRAVENOUS | Status: DC
  Administered 2016-03-02: 10 mL via INTRAVENOUS
  Administered 2016-03-03: 3 mL via INTRAVENOUS

## 2016-03-02 MED ORDER — BACLOFEN 5 MG HALF TABLET
5.0000 mg | ORAL_TABLET | Freq: Once | ORAL | Status: AC
  Administered 2016-03-02: 5 mg via ORAL
  Filled 2016-03-02: qty 1

## 2016-03-02 MED ORDER — SENNA 8.6 MG PO TABS: 1.0000 | ORAL_TABLET | Freq: Two times a day (BID) | ORAL | Status: DC

## 2016-03-02 MED ORDER — GADOBENATE DIMEGLUMINE 529 MG/ML IV SOLN
20.0000 mL | Freq: Once | INTRAVENOUS | Status: AC | PRN
  Administered 2016-03-02: 20 mL via INTRAVENOUS

## 2016-03-02 MED ORDER — ONDANSETRON HCL 4 MG/2ML IJ SOLN: 4.0000 mg | Freq: Four times a day (QID) | INTRAMUSCULAR | Status: DC | PRN

## 2016-03-02 MED ORDER — DEXAMETHASONE SODIUM PHOSPHATE 10 MG/ML IJ SOLN: 10.0000 mg | Freq: Once | INTRAMUSCULAR | Status: DC

## 2016-03-02 MED ORDER — DIPHENHYDRAMINE HCL 50 MG/ML IJ SOLN
25.0000 mg | Freq: Once | INTRAMUSCULAR | Status: AC
  Administered 2016-03-02: 25 mg via INTRAVENOUS
  Filled 2016-03-02: qty 1

## 2016-03-02 MED ORDER — POTASSIUM CHLORIDE IN NACL 20-0.9 MEQ/L-% IV SOLN
INTRAVENOUS | Status: DC
  Administered 2016-03-03 (×2): via INTRAVENOUS
  Filled 2016-03-02 (×2): qty 1000

## 2016-03-02 MED ORDER — AMLODIPINE BESYLATE 10 MG PO TABS
10.0000 mg | ORAL_TABLET | Freq: Every day | ORAL | Status: DC
  Administered 2016-03-03 – 2016-03-06 (×4): 10 mg via ORAL
  Filled 2016-03-02 (×5): qty 1

## 2016-03-02 MED ORDER — DEXAMETHASONE SODIUM PHOSPHATE 10 MG/ML IJ SOLN
10.0000 mg | Freq: Once | INTRAMUSCULAR | Status: AC
  Administered 2016-03-02: 10 mg via INTRAVENOUS
  Filled 2016-03-02: qty 1

## 2016-03-02 MED ORDER — ONDANSETRON HCL 4 MG PO TABS: 4.0000 mg | ORAL_TABLET | Freq: Four times a day (QID) | ORAL | Status: DC | PRN

## 2016-03-02 MED ORDER — ACETAMINOPHEN 650 MG RE SUPP: 650.0000 mg | Freq: Four times a day (QID) | RECTAL | Status: DC | PRN

## 2016-03-02 MED ORDER — BACLOFEN 10 MG PO TABS
5.0000 mg | ORAL_TABLET | Freq: Every day | ORAL | Status: DC | PRN
  Administered 2016-03-02 – 2016-03-06 (×7): 5 mg via ORAL
  Filled 2016-03-02 (×7): qty 1

## 2016-03-02 MED ORDER — METOCLOPRAMIDE HCL 5 MG/ML IJ SOLN
10.0000 mg | Freq: Once | INTRAMUSCULAR | Status: AC
  Administered 2016-03-02: 10 mg via INTRAVENOUS
  Filled 2016-03-02: qty 2

## 2016-03-02 MED ORDER — IOPAMIDOL (ISOVUE-370) INJECTION 76%
INTRAVENOUS | Status: AC
  Administered 2016-03-02: 50 mL
  Filled 2016-03-02: qty 50

## 2016-03-02 MED ORDER — ACETAMINOPHEN 325 MG PO TABS: 650.0000 mg | ORAL_TABLET | Freq: Four times a day (QID) | ORAL | Status: DC | PRN

## 2016-03-02 NOTE — ED Provider Notes (Signed)
[facility] DEPT Provider Note   CSN: [id number] Arrival date & time: 03/02/16  1033     History   Chief Complaint Chief Complaint  Patient presents with  . Code Stroke    HPI Kyle Chan is a 50 y.o. male.  The history is provided by the patient and the EMS personnel.  Neurologic Problem  This is a new problem. The current episode started 3 to 5 hours ago. The problem occurs constantly. The problem has not changed since onset.Associated symptoms comments: Aphasia, sudden onset, last seen by spouse in normal state of health just prior to leaving for work. Nothing aggravates the symptoms. Nothing relieves the symptoms. He has tried nothing for the symptoms.    Past Medical History:  Diagnosis Date  . Hypertension     There are no active problems to display for this patient.   Past Surgical History:  Procedure Laterality Date  . NASAL RECONSTRUCTION WITH SEPTAL REPAIR N/A 11/18/2012   Procedure: NASAL RECONSTRUCTION WITH SEPTAL REPAIR;  Surgeon: Melida Quitter, MD;  Location: [facility];  Service: ENT;  Laterality: N/A;       Home Medications    Prior to Admission medications   Medication Sig Start Date End Date Taking? Authorizing Provider  acetaminophen (TYLENOL) 500 MG tablet Take 1,000 mg by mouth every 6 (six) hours as needed (fever).    Historical Provider, MD  amLODipine (NORVASC) 10 MG tablet Take 10 mg by mouth daily.  11/23/12   Historical Provider, MD  aspirin EC 81 MG tablet Take 81 mg by mouth daily.    Historical Provider, MD  diazepam (VALIUM) 5 MG tablet Take 5 mg by mouth every 6 (six) hours as needed.  11/20/12   Historical Provider, MD  omega-3 acid ethyl esters (LOVAZA) 1 G capsule Take 1 g by mouth daily.    Historical Provider, MD  ondansetron (ZOFRAN ODT) 4 MG disintegrating tablet Take 1 tablet (4 mg total) by mouth every 8 (eight) hours as needed for nausea. 05/05/13   Maude Leriche, PA-C  vitamin B-12 (CYANOCOBALAMIN) 100 MCG tablet Take 250 mcg  by mouth daily.    Historical Provider, MD  vitamin C (ASCORBIC ACID) 500 MG tablet Take 500 mg by mouth daily.    Historical Provider, MD    Family History No family history on file.  Social History Social History  Substance Use Topics  . Smoking status: Former Smoker    Types: Cigarettes  . Smokeless tobacco: Not on file  . Alcohol use Yes     Comment: occ     Allergies   Patient has no known allergies.   Review of Systems Review of Systems  All other systems reviewed and are negative.    Physical Exam Updated Vital Signs BP 153/93 (BP Location: Left Arm)   Pulse 94   Temp 99.7 F (37.6 C) (Oral)   Resp 21   Ht 5\' 9"  (1.753 m)   Wt 239 lb 6.7 oz (108.6 kg)   SpO2 100%   BMI 35.36 kg/m   Physical Exam  Constitutional: He appears well-developed and well-nourished. No distress.  HENT:  Head: Normocephalic and atraumatic.  Nose: Nose normal.  Eyes: Conjunctivae are normal.  Neck: Neck supple. No tracheal deviation present.  Cardiovascular: Normal rate, regular rhythm and normal heart sounds.   Pulmonary/Chest: Effort normal and breath sounds normal. No respiratory distress.  Abdominal: Soft. He exhibits no distension.  Neurological: He is alert. He has normal strength. No cranial nerve  deficit. Coordination normal.  Perseverating speech, expressive aphasia, follows commands appropriately  Skin: Skin is warm and dry.  Psychiatric: He has a normal mood and affect.  Vitals reviewed.    ED Treatments / Results  Labs (all labs ordered are listed, but only abnormal results are displayed) Labs Reviewed  CBC - Abnormal; Notable for the following:       Result Value   WBC 12.7 (*)    All other components within normal limits  DIFFERENTIAL - Abnormal; Notable for the following:    Neutro Abs 10.1 (*)    All other components within normal limits  COMPREHENSIVE METABOLIC PANEL - Abnormal; Notable for the following:    CO2 21 (*)    Glucose, Bld 126 (*)     All other components within normal limits  URINALYSIS, ROUTINE W REFLEX MICROSCOPIC - Abnormal; Notable for the following:    Color, Urine STRAW (*)    pH 9.0 (*)    Ketones, ur 5 (*)    Leukocytes, UA SMALL (*)    Squamous Epithelial / LPF 0-5 (*)    All other components within normal limits  I-STAT CHEM 8, ED - Abnormal; Notable for the following:    Glucose, Bld 125 (*)    Calcium, Ion 1.06 (*)    All other components within normal limits  CBG MONITORING, ED - Abnormal; Notable for the following:    Glucose-Capillary 112 (*)    All other components within normal limits  ETHANOL  PROTIME-INR  APTT  RAPID URINE DRUG SCREEN, HOSP PERFORMED  I-STAT TROPOININ, ED    EKG  EKG Interpretation  Date/Time:  Wednesday March 02 2016 11:18:42 EST Ventricular Rate:  86 PR Interval:    QRS Duration: 93 QT Interval:  359 QTC Calculation: 430 R Axis:   49 Text Interpretation:  Sinus rhythm Borderline low voltage, extremity leads Baseline wander in lead(s) V2 Confirmed by Sharmaine Bain MD, Raheel Kunkle [id number]) on 03/02/2016 2:30:48 PM       Radiology Ct Angio Head W Or Wo Contrast  Result Date: 03/02/2016 CLINICAL DATA:  Sudden onset expressive aphasia and confusion. EXAM: CT ANGIOGRAPHY HEAD AND NECK TECHNIQUE: Multidetector CT imaging of the head and neck was performed using the standard protocol during bolus administration of intravenous contrast. Multiplanar CT image reconstructions and MIPs were obtained to evaluate the vascular anatomy. Carotid stenosis measurements (when applicable) are obtained utilizing NASCET criteria, using the distal internal carotid diameter as the denominator. CONTRAST:  50 mL Isovue 370 COMPARISON:  Noncontrast head CT earlier today FINDINGS: CTA NECK FINDINGS Aortic arch: 3 vessel aortic arch. Brachiocephalic and subclavian arteries are widely patent. Right carotid system: Patent without evidence of stenosis or dissection. Left carotid system: Patent without evidence of  stenosis or dissection. Vertebral arteries: Patent without evidence of stenosis or dissection. Left vertebral artery is dominant. Skeleton: No acute osseous abnormality or suspicious osseous lesion. Other neck: No mass or lymph node enlargement. Upper chest: Unremarkable. Review of the MIP images confirms the above findings CTA HEAD FINDINGS Anterior circulation: Internal carotid arteries are widely patent from skullbase to carotid termini. The cavernous segments are tortuous, particularly on the right. ACAs and MCAs are patent without evidence of flow limiting proximal stenosis. MCA branch vessel evaluation is limited by venous contamination as well as the large left frontal mass which medially displaces the left MCA sylvian branches. No aneurysm. Posterior circulation: The intracranial vertebral arteries are patent to the basilar with mild irregularity but no significant stenosis. PICA,  AICA, and SCA origins are patent. Basilar artery is widely patent. Posterior communicating arteries are diminutive or absent. PCAs are patent without evidence of significant stenosis. No aneurysm. Venous sinuses: Patent. Anatomic variants: None. Delayed phase: Mass centered in the left frontal operculum described on earlier CT demonstrates avid, largely homogeneous enhancement throughout aside from a small amount of hypoenhancement centrally and superiorly. Mass effect and midline shift with right lateral ventricular trapping as described on recent noncontrast CT. No additional enhancing lesions identified. Review of the MIP images confirms the above findings IMPRESSION: 1. No large vessel occlusion or significant stenosis in the intracranial or cervical arterial circulation. 2. 6.6 cm left frontal mass most consistent with meningioma. Electronically Signed   By: Logan Bores M.D.   On: 03/02/2016 11:36   Ct Angio Neck W Or Wo Contrast  Result Date: 03/02/2016 CLINICAL DATA:  Sudden onset expressive aphasia and confusion. EXAM:  CT ANGIOGRAPHY HEAD AND NECK TECHNIQUE: Multidetector CT imaging of the head and neck was performed using the standard protocol during bolus administration of intravenous contrast. Multiplanar CT image reconstructions and MIPs were obtained to evaluate the vascular anatomy. Carotid stenosis measurements (when applicable) are obtained utilizing NASCET criteria, using the distal internal carotid diameter as the denominator. CONTRAST:  50 mL Isovue 370 COMPARISON:  Noncontrast head CT earlier today FINDINGS: CTA NECK FINDINGS Aortic arch: 3 vessel aortic arch. Brachiocephalic and subclavian arteries are widely patent. Right carotid system: Patent without evidence of stenosis or dissection. Left carotid system: Patent without evidence of stenosis or dissection. Vertebral arteries: Patent without evidence of stenosis or dissection. Left vertebral artery is dominant. Skeleton: No acute osseous abnormality or suspicious osseous lesion. Other neck: No mass or lymph node enlargement. Upper chest: Unremarkable. Review of the MIP images confirms the above findings CTA HEAD FINDINGS Anterior circulation: Internal carotid arteries are widely patent from skullbase to carotid termini. The cavernous segments are tortuous, particularly on the right. ACAs and MCAs are patent without evidence of flow limiting proximal stenosis. MCA branch vessel evaluation is limited by venous contamination as well as the large left frontal mass which medially displaces the left MCA sylvian branches. No aneurysm. Posterior circulation: The intracranial vertebral arteries are patent to the basilar with mild irregularity but no significant stenosis. PICA, AICA, and SCA origins are patent. Basilar artery is widely patent. Posterior communicating arteries are diminutive or absent. PCAs are patent without evidence of significant stenosis. No aneurysm. Venous sinuses: Patent. Anatomic variants: None. Delayed phase: Mass centered in the left frontal  operculum described on earlier CT demonstrates avid, largely homogeneous enhancement throughout aside from a small amount of hypoenhancement centrally and superiorly. Mass effect and midline shift with right lateral ventricular trapping as described on recent noncontrast CT. No additional enhancing lesions identified. Review of the MIP images confirms the above findings IMPRESSION: 1. No large vessel occlusion or significant stenosis in the intracranial or cervical arterial circulation. 2. 6.6 cm left frontal mass most consistent with meningioma. Electronically Signed   By: Logan Bores M.D.   On: 03/02/2016 11:36   Ct Head Code Stroke W/o Cm  Result Date: 03/02/2016 CLINICAL DATA:  Code stroke. Sudden onset expressive aphasia and confusion. EXAM: CT HEAD WITHOUT CONTRAST TECHNIQUE: Contiguous axial images were obtained from the base of the skull through the vertex without intravenous contrast. COMPARISON:  11/18/2012 FINDINGS: Brain: There is an approximately 7 x 5 cm mass centered in the region of the left frontal operculum and insula. There is a small  amount of calcification along the lateral margin of the mass just deep to the skull with evidence of some mild osseous remodeling. There is mass effect on the left basal ganglia with 12 mm of rightward midline shift. There is partial effacement of the right lateral ventricle. There is also new mild dilatation of the right lateral ventricle. There is no evidence of acute cortical infarct, intracranial hemorrhage, or extra-axial fluid collection. Vascular: No hyperdense vessel or unexpected calcification. Skull: No evidence of fracture or focal osseous lesion. Sinuses/Orbits: No acute finding. Other: None. ASPECTS [facility] Stroke Program Early CT Score): Not calculated due to the presence of a mass. IMPRESSION: 1. 7 cm left frontal mass most concerning for primary CNS neoplasm. Contrast-enhanced brain MRI recommended for further evaluation. 2. 12 mm midline shift.  Mild trapping of the right lateral ventricle. The study was reviewed in person with Dr. Alver Fisher on 03/02/2016 at 10:51 a.m. Electronically Signed   By: Logan Bores M.D.   On: 03/02/2016 11:24    Procedures Procedures (including critical care time)  Medications Ordered in ED Medications  iopamidol (ISOVUE-370) 76 % injection (50 mLs  Contrast Given 03/02/16 1100)  dexamethasone (DECADRON) injection 10 mg (10 mg Intravenous Given 03/02/16 1243)  baclofen (LIORESAL) tablet 5 mg (5 mg Oral Given 03/02/16 1418)  metoCLOPramide (REGLAN) injection 10 mg (10 mg Intravenous Given 03/02/16 1548)  diphenhydrAMINE (BENADRYL) injection 25 mg (25 mg Intravenous Given 03/02/16 1548)     Initial Impression / Assessment and Plan / ED Course  I have reviewed the triage vital signs and the nursing notes.  Pertinent labs & imaging results that were available during my care of the patient were reviewed by me and considered in my medical decision making (see chart for details).     51 y.o. male presents with expressive aphasia that was sudden onset prior to arrival last seen normal by spouse this morning before leaving for work. Activated as code stroke d/t recent onset, CT with evidence of large mass that appears consistent with meningioma and midline shift. Not present on previous imaging available from 2 years ago. Neurology requesting neurosurgery consultation. Discussed with neurosurgery who is recommending MR with and without contrast to further characterize lesion and will determine disposition following. Pt given decadron for mass effect evident with midline shift. C/o headache and given reglan/benadryl. Requesting home baclofen for hiccups which was provided. Bedside EEG without seizure activity noted.   Plan for re-engaging neurosurgery after results of study and admission to appropriate service given rapid change in symptoms. Care transferred to Dr Tegeler at 4 pm pending results.   Final Clinical  Impressions(s) / ED Diagnoses   Final diagnoses:  Meningioma [facility])  Expressive aphasia    New Prescriptions New Prescriptions   No medications on file     Leo Grosser, MD 03/02/16 1743

## 2016-03-02 NOTE — ED Notes (Signed)
Stroke nurse,EEG techs and wife are at bedside

## 2016-03-02 NOTE — ED Triage Notes (Signed)
Pt brought in by EMS due to having aphagia. No weakness or sensory deficit. Pt is alert. Pt has hx of HTN.

## 2016-03-02 NOTE — H&P (Signed)
Reason for Consult:  Suspected brain mass Referring Physician:  ED P  JAJA PROUSE is an 51 y.o. male.   HPI:   51 year old male who was brought by EMS to the emergency department as a code stroke early this morning. when he got to work he could not speak.  Patient is sedated for his MRI and cannot cooperate with history and physical exam.  His wife states that he has trouble with speech here in the emergency department but certainly understands what she says to him.  He has not been ill or had trouble with headache or numbness tingling or weakness prior to this morning.  He was in his usual state of health.  CT scan suggested a large left-sided brain mass in the frontal and temporal region.  Neurosurgical evaluation was requested.  Past Medical History:  Diagnosis Date  . Hypertension     Past Surgical History:  Procedure Laterality Date  . NASAL RECONSTRUCTION WITH SEPTAL REPAIR N/A 11/18/2012   Procedure: NASAL RECONSTRUCTION WITH SEPTAL REPAIR;  Surgeon: Melida Quitter, MD;  Location: [facility];  Service: ENT;  Laterality: N/A;    No Known Allergies  Social History  Substance Use Topics  . Smoking status: Former Smoker    Types: Cigarettes  . Smokeless tobacco: Not on file  . Alcohol use Yes     Comment: occ    History reviewed. No pertinent family history.   Review of Systems  Positive ROS:   Unable to obtain  All other systems have been reviewed and were otherwise negative with the exception of those mentioned in the HPI and as above.  Objective: Vital signs in last 24 hours: Temp:  [99.7 F (37.6 C)] 99.7 F (37.6 C) (02/07 1113) Pulse Rate:  [72-101] 101 (02/07 1730) Resp:  [14-32] 31 (02/07 1730) BP: (140-160)/(73-97) 152/78 (02/07 1730) SpO2:  [94 %-100 %] 94 % (02/07 1730) Weight:  [108.6 kg (239 lb 6.7 oz)] 108.6 kg (239 lb 6.7 oz) (02/07 1000)  General Appearance:  Sedated and sleeping comfortably Head: Normocephalic, without obvious abnormality,  atraumatic Eyes: PERRL      Neck: Supple Lungs:  respirations unlabored Heart: Regular rate and rhythm Abdomen: Soft  NEUROLOGIC:   Mental status:  Patient is sedated her MRI Motor Exam -  Unable to assess Sensory Exam -  Unable to assess Reflexes: symmetric, no pathologic reflexes, No Hoffman's, No clonus Coordination -  Unable to assess Gait -  Unable to assess Balance -  Unable to assess Cranial Nerves: I: smell Not tested  II: visual acuity  OS: na    OD: na  II: visual fields   II: pupils Equal, round, reactive to light  III,VII: ptosis   III,IV,VI: extraocular muscles    V: mastication   V: facial light touch sensation    V,VII: corneal reflex    VII: facial muscle function - upper    VII: facial muscle function - lower   VIII: hearing   IX: soft palate elevation    IX,X: gag reflex   XI: trapezius strength    XI: sternocleidomastoid strength   XI: neck flexion strength    XII: tongue strength      Data Review Lab Results  Component Value Date   WBC 12.7 (H) 03/02/2016   HGB 16.3 03/02/2016   HCT 48.0 03/02/2016   MCV 88.0 03/02/2016   PLT 225 03/02/2016   Lab Results  Component Value Date   NA 142 03/02/2016   K  4.0 03/02/2016   CL 108 03/02/2016   CO2 21 (L) 03/02/2016   BUN 12 03/02/2016   CREATININE 0.90 03/02/2016   GLUCOSE 125 (H) 03/02/2016   Lab Results  Component Value Date   INR 0.99 03/02/2016    Radiology: Ct Angio Head W Or Wo Contrast  Result Date: 03/02/2016 CLINICAL DATA:  Sudden onset expressive aphasia and confusion. EXAM: CT ANGIOGRAPHY HEAD AND NECK TECHNIQUE: Multidetector CT imaging of the head and neck was performed using the standard protocol during bolus administration of intravenous contrast. Multiplanar CT image reconstructions and MIPs were obtained to evaluate the vascular anatomy. Carotid stenosis measurements (when applicable) are obtained utilizing NASCET criteria, using the distal internal carotid diameter as the  denominator. CONTRAST:  50 mL Isovue 370 COMPARISON:  Noncontrast head CT earlier today FINDINGS: CTA NECK FINDINGS Aortic arch: 3 vessel aortic arch. Brachiocephalic and subclavian arteries are widely patent. Right carotid system: Patent without evidence of stenosis or dissection. Left carotid system: Patent without evidence of stenosis or dissection. Vertebral arteries: Patent without evidence of stenosis or dissection. Left vertebral artery is dominant. Skeleton: No acute osseous abnormality or suspicious osseous lesion. Other neck: No mass or lymph node enlargement. Upper chest: Unremarkable. Review of the MIP images confirms the above findings CTA HEAD FINDINGS Anterior circulation: Internal carotid arteries are widely patent from skullbase to carotid termini. The cavernous segments are tortuous, particularly on the right. ACAs and MCAs are patent without evidence of flow limiting proximal stenosis. MCA branch vessel evaluation is limited by venous contamination as well as the large left frontal mass which medially displaces the left MCA sylvian branches. No aneurysm. Posterior circulation: The intracranial vertebral arteries are patent to the basilar with mild irregularity but no significant stenosis. PICA, AICA, and SCA origins are patent. Basilar artery is widely patent. Posterior communicating arteries are diminutive or absent. PCAs are patent without evidence of significant stenosis. No aneurysm. Venous sinuses: Patent. Anatomic variants: None. Delayed phase: Mass centered in the left frontal operculum described on earlier CT demonstrates avid, largely homogeneous enhancement throughout aside from a small amount of hypoenhancement centrally and superiorly. Mass effect and midline shift with right lateral ventricular trapping as described on recent noncontrast CT. No additional enhancing lesions identified. Review of the MIP images confirms the above findings IMPRESSION: 1. No large vessel occlusion or  significant stenosis in the intracranial or cervical arterial circulation. 2. 6.6 cm left frontal mass most consistent with meningioma. Electronically Signed   By: Logan Bores M.D.   On: 03/02/2016 11:36   Ct Angio Neck W Or Wo Contrast  Result Date: 03/02/2016 CLINICAL DATA:  Sudden onset expressive aphasia and confusion. EXAM: CT ANGIOGRAPHY HEAD AND NECK TECHNIQUE: Multidetector CT imaging of the head and neck was performed using the standard protocol during bolus administration of intravenous contrast. Multiplanar CT image reconstructions and MIPs were obtained to evaluate the vascular anatomy. Carotid stenosis measurements (when applicable) are obtained utilizing NASCET criteria, using the distal internal carotid diameter as the denominator. CONTRAST:  50 mL Isovue 370 COMPARISON:  Noncontrast head CT earlier today FINDINGS: CTA NECK FINDINGS Aortic arch: 3 vessel aortic arch. Brachiocephalic and subclavian arteries are widely patent. Right carotid system: Patent without evidence of stenosis or dissection. Left carotid system: Patent without evidence of stenosis or dissection. Vertebral arteries: Patent without evidence of stenosis or dissection. Left vertebral artery is dominant. Skeleton: No acute osseous abnormality or suspicious osseous lesion. Other neck: No mass or lymph node enlargement. Upper chest:  Unremarkable. Review of the MIP images confirms the above findings CTA HEAD FINDINGS Anterior circulation: Internal carotid arteries are widely patent from skullbase to carotid termini. The cavernous segments are tortuous, particularly on the right. ACAs and MCAs are patent without evidence of flow limiting proximal stenosis. MCA branch vessel evaluation is limited by venous contamination as well as the large left frontal mass which medially displaces the left MCA sylvian branches. No aneurysm. Posterior circulation: The intracranial vertebral arteries are patent to the basilar with mild irregularity but  no significant stenosis. PICA, AICA, and SCA origins are patent. Basilar artery is widely patent. Posterior communicating arteries are diminutive or absent. PCAs are patent without evidence of significant stenosis. No aneurysm. Venous sinuses: Patent. Anatomic variants: None. Delayed phase: Mass centered in the left frontal operculum described on earlier CT demonstrates avid, largely homogeneous enhancement throughout aside from a small amount of hypoenhancement centrally and superiorly. Mass effect and midline shift with right lateral ventricular trapping as described on recent noncontrast CT. No additional enhancing lesions identified. Review of the MIP images confirms the above findings IMPRESSION: 1. No large vessel occlusion or significant stenosis in the intracranial or cervical arterial circulation. 2. 6.6 cm left frontal mass most consistent with meningioma. Electronically Signed   By: Logan Bores M.D.   On: 03/02/2016 11:36   Ct Head Code Stroke W/o Cm  Result Date: 03/02/2016 CLINICAL DATA:  Code stroke. Sudden onset expressive aphasia and confusion. EXAM: CT HEAD WITHOUT CONTRAST TECHNIQUE: Contiguous axial images were obtained from the base of the skull through the vertex without intravenous contrast. COMPARISON:  11/18/2012 FINDINGS: Brain: There is an approximately 7 x 5 cm mass centered in the region of the left frontal operculum and insula. There is a small amount of calcification along the lateral margin of the mass just deep to the skull with evidence of some mild osseous remodeling. There is mass effect on the left basal ganglia with 12 mm of rightward midline shift. There is partial effacement of the right lateral ventricle. There is also new mild dilatation of the right lateral ventricle. There is no evidence of acute cortical infarct, intracranial hemorrhage, or extra-axial fluid collection. Vascular: No hyperdense vessel or unexpected calcification. Skull: No evidence of fracture or focal  osseous lesion. Sinuses/Orbits: No acute finding. Other: None. ASPECTS [facility] Stroke Program Early CT Score): Not calculated due to the presence of a mass. IMPRESSION: 1. 7 cm left frontal mass most concerning for primary CNS neoplasm. Contrast-enhanced brain MRI recommended for further evaluation. 2. 12 mm midline shift. Mild trapping of the right lateral ventricle. The study was reviewed in person with Dr. Alver Fisher on 03/02/2016 at 10:51 a.m. Electronically Signed   By: Logan Bores M.D.   On: 03/02/2016 11:24     Assessment/Plan: It appears he has a left frontal temporal brain mass.  Based on CT this looks most like a large meningioma.  However,  MRI with and without contrast will help determine the most likely type of lesion and help determine further treatment options.  I have spoken with his wife at length about this.   JONES,DAVID S 03/02/2016 6:06 PM

## 2016-03-02 NOTE — Consult Note (Signed)
Requesting Physician: Dr. 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 Patient was last seen driving the truck out of the facility 0 647's morning and at that time was speaking clearly. When patient returned he was noted to be perseverating on the Word 20. Patient was brought to the emergency department as a code stroke. CT scan showed a large left frontal mass. Code stroke was canceled secondary to intracranial mass. Further evaluation of previous scans multiple years ago did  show mass or abnormalities. No MRI was obtained at that time.   Date last known well: Date: 02/26/2016 Time last known well: Time: 06:47 tPA Given: No: No stroke on CT more indicative of large intracranial mass   Past Medical History:  Diagnosis Date  . Hypertension     Past Surgical History:  Procedure Laterality Date  . NASAL RECONSTRUCTION WITH SEPTAL REPAIR N/A 11/18/2012   Procedure: NASAL RECONSTRUCTION WITH SEPTAL REPAIR;  Surgeon: Melida Quitter, MD;  Location: [facility];  Service: ENT;  Laterality: N/A;    No family history on file. Social History:  reports that he has quit smoking. His smoking use included Cigarettes. He does not have any smokeless tobacco history on file. He reports that he drinks alcohol. He reports that he does not use drugs.  Allergies: No Known Allergies  Medications:                                                                                                                           No current facility-administered medications for this encounter.    Current Outpatient Prescriptions  Medication Sig Dispense Refill  . acetaminophen (TYLENOL) 500 MG tablet Take 1,000 mg by mouth every 6 (six) hours as needed (fever).    Marland Kitchen amLODipine  (NORVASC) 10 MG tablet Take 10 mg by mouth daily.     Marland Kitchen aspirin EC 81 MG tablet Take 81 mg by mouth daily.    . diazepam (VALIUM) 5 MG tablet Take 5 mg by mouth every 6 (six) hours as needed.     Marland Kitchen  omega-3 acid ethyl esters (LOVAZA) 1 G capsule Take 1 g by mouth daily.    . ondansetron (ZOFRAN ODT) 4 MG disintegrating tablet Take 1 tablet (4 mg total) by mouth every 8 (eight) hours as needed for nausea. 10 tablet 0  . vitamin B-12 (CYANOCOBALAMIN) 100 MCG tablet Take 250 mcg by mouth daily.    . vitamin C (ASCORBIC ACID) 500 MG tablet Take 500 mg by mouth daily.       ROS:                                                                                                                                       History obtained from unobtainable from patient due to mental status   Neurologic Examination:                                                                                                      Weight 108.6 kg (239 lb 6.7 oz).  HEENT-  Normocephalic, no lesions, without obvious abnormality.  Normal external eye and conjunctiva.  Normal TM's bilaterally.  Normal auditory canals and external ears. Normal external nose, mucus membranes and septum.  Normal pharynx. Cardiovascular- S1, S2 normal, pulses palpable throughout   Lungs- tachypnea is noted Abdomen- normal findings: bowel sounds normal Extremities- no edema Lymph-no adenopathy palpable Musculoskeletal-no joint tenderness, deformity or swelling Skin-warm and dry, no hyperpigmentation, vitiligo, or suspicious lesions  Neurological Examination Mental Status: Alert, unable to repeat words. Able to follow some commands. Able to count fingers. Difficulty reading and naming objects Cranial Nerves: II: ; Visual fields grossly normal,  III,IV, VI: ptosis not present, extra-ocular motions intact bilaterally, pupils equal, round, reactive to light and accommodation V,VII: smile symmetric, facial light touch sensation normal  bilaterally VIII: hearing normal bilaterally IX,X: uvula rises symmetrically XI: bilateral shoulder shrug XII: midline tongue extension Motor: Right : Upper extremity   5/5    Left:     Upper extremity   5/5  Lower extremity   5/5     Lower extremity   5/5 Tone and bulk:normal tone throughout; no atrophy noted Sensory: Pinprick and light touch intact throughout, bilaterally Deep Tendon Reflexes: 2+ and symmetric throughout Plantars: Right: downgoing   Left: downgoing Cerebellar: Not able to do formally however no dysmetria was noted Gait: Not tested       Lab Results: Basic Metabolic Panel:  Recent Labs Lab 03/02/16 1044  NA 142  K 4.0  CL 108  GLUCOSE 125*  BUN 12  CREATININE 0.90    Liver Function Tests: No results for input(s): AST, ALT, ALKPHOS, BILITOT, PROT, ALBUMIN in the last 168 hours. No results for input(s): LIPASE, AMYLASE in the last 168 hours. No results for input(s): AMMONIA in the last 168 hours.  CBC:  Recent Labs Lab 03/02/16 1034 03/02/16 1044  WBC 12.7*  --   NEUTROABS 10.1*  --   HGB 16.0 16.3  HCT 44.9 48.0  MCV 88.0  --   PLT 225  --     Cardiac Enzymes: No results for input(s): CKTOTAL, CKMB, CKMBINDEX, TROPONINI in the last 168 hours.  Lipid Panel: No results for input(s): CHOL, TRIG, HDL, CHOLHDL, VLDL, LDLCALC in the last 168 hours.  CBG:  Recent Labs Lab 03/02/16 [place]    Microbiology: No results found for this or any previous visit.  Coagulation Studies:  Recent Labs  03/02/16 1034  LABPROT 13.1  INR 0.99    Imaging: No results found.     Assessment and plan discussed with with attending physician and they are in agreement.    Etta Quill PA-C [facility] [phone]  03/02/2016, 11:10 AM   Assessment: 51 y.o. male presenting as code stroke with broke his aphasia. CT scan obtained showed a large left frontal mass. At this time patient is not a TPA candidate. At this time  oncology and neurosurgery will need to be notified.  Stroke Risk Factors - none

## 2016-03-02 NOTE — Code Documentation (Signed)
51 y.o. male presents to [facility] ED via [facility] as code stroke. 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 Upon arrival after his delivery at [phone], he was noticed to be aphasic and perseverating on the Word 20. CT scan showed a large left frontal mass. Code stroke was canceled secondary to intracranial mass per neurologist. Nihss 4. See EMR for NIHSS and code stroke times. Patient currently with expressive aphasia and unable to correctly answer questions. No tPA given d/t not a stroke. No MRI was obtained at that time. Bedside handoff with ED RN Whitney.

## 2016-03-02 NOTE — Progress Notes (Signed)
STAT EEG Completed; Results Pending   

## 2016-03-02 NOTE — Procedures (Signed)
ELECTROENCEPHALOGRAM REPORT  Date of Study: 03/02/2016  Patient's Name: Kyle Chan MRN: [id number] Date of Birth: [birth year]  Referring Provider: Loralee Pacas. Tamala Julian, PA-C  Clinical History: 51 year old male with an intracranial left frontal mass lesion who presents with perseveration of speech.  Medications: Decadron Tylenol Norvasc Valium Zofran   Technical Summary: A multichannel digital EEG recording measured by the international 10-20 system with electrodes applied with paste and impedances below 5000 ohms performed in our laboratory with EKG monitoring in an awake and asleep patient.  Hyperventilation and photic stimulation were not performed.  The digital EEG was referentially recorded, reformatted, and digitally filtered in a variety of bipolar and referential montages for optimal display.    Description: The patient is awake and asleep during the recording.  During maximal wakefulness, there is a symmetric, medium voltage 11 Hz posterior dominant rhythm that attenuates with eye opening.  The record is symmetric.  During drowsiness and sleep, there is an increase in theta slowing of the background.  Vertex waves and symmetric sleep spindles were seen.  There were no epileptiform discharges or electrographic seizures seen.    EKG lead was unremarkable.  Impression: This awake and asleep EEG is normal.    Clinical Correlation: A normal EEG does not exclude a clinical diagnosis of epilepsy.  If further clinical questions remain, prolonged EEG may be helpful.  Clinical correlation is advised.   Metta Clines, DO

## 2016-03-02 NOTE — ED Notes (Signed)
Patient transported to MRI 

## 2016-03-02 NOTE — ED Notes (Signed)
Neuro surgery at bedside.

## 2016-03-03 ENCOUNTER — Inpatient Hospital Stay (HOSPITAL_COMMUNITY): Payer: Commercial Managed Care - HMO | Admitting: Anesthesiology

## 2016-03-03 ENCOUNTER — Encounter (HOSPITAL_COMMUNITY)
Admission: EM | Disposition: A | Discharge status: Home / Self Care | Payer: Self-pay | Source: Home / Self Care | Attending: Neurological Surgery

## 2016-03-03 ENCOUNTER — Encounter (HOSPITAL_COMMUNITY): Payer: Self-pay | Admitting: *Deleted

## 2016-03-03 DIAGNOSIS — Z9889 Other specified postprocedural states: Secondary | ICD-10-CM

## 2016-03-03 HISTORY — PX: CRANIOTOMY: SHX93

## 2016-03-03 LAB — MRSA PCR SCREENING: MRSA by PCR: NEGATIVE

## 2016-03-03 LAB — PREPARE RBC (CROSSMATCH)

## 2016-03-03 SURGERY — CRANIOTOMY TUMOR EXCISION
Anesthesia: General | Site: Head | Laterality: Left

## 2016-03-03 MED ORDER — THROMBIN 20000 UNITS EX SOLR
CUTANEOUS | Status: DC | PRN
  Administered 2016-03-03: 18:00:00 via TOPICAL

## 2016-03-03 MED ORDER — POTASSIUM CHLORIDE IN NACL 20-0.9 MEQ/L-% IV SOLN
INTRAVENOUS | Status: DC
  Administered 2016-03-03: 1000 mL via INTRAVENOUS
  Administered 2016-03-04: 13:00:00 via INTRAVENOUS
  Filled 2016-03-03 (×4): qty 1000

## 2016-03-03 MED ORDER — 0.9 % SODIUM CHLORIDE (POUR BTL) OPTIME
TOPICAL | Status: DC | PRN
  Administered 2016-03-03 (×3): 1000 mL

## 2016-03-03 MED ORDER — DEXAMETHASONE SODIUM PHOSPHATE 10 MG/ML IJ SOLN
6.0000 mg | Freq: Four times a day (QID) | INTRAMUSCULAR | Status: AC
  Administered 2016-03-03 – 2016-03-04 (×4): 6 mg via INTRAVENOUS
  Filled 2016-03-03 (×4): qty 1

## 2016-03-03 MED ORDER — FENTANYL CITRATE (PF) 100 MCG/2ML IJ SOLN
INTRAMUSCULAR | Status: AC
  Filled 2016-03-03: qty 4

## 2016-03-03 MED ORDER — ONDANSETRON HCL 4 MG/2ML IJ SOLN
INTRAMUSCULAR | Status: DC | PRN
  Administered 2016-03-03: 4 mg via INTRAVENOUS

## 2016-03-03 MED ORDER — SODIUM CHLORIDE 0.9 % IV SOLN
INTRAVENOUS | Status: DC | PRN
  Administered 2016-03-03 (×3): via INTRAVENOUS

## 2016-03-03 MED ORDER — SENNA 8.6 MG PO TABS
1.0000 | ORAL_TABLET | Freq: Two times a day (BID) | ORAL | Status: DC
  Administered 2016-03-04 – 2016-03-06 (×5): 8.6 mg via ORAL
  Filled 2016-03-03 (×6): qty 1

## 2016-03-03 MED ORDER — LABETALOL HCL 5 MG/ML IV SOLN: 10.0000 mg | INTRAVENOUS | Status: DC | PRN

## 2016-03-03 MED ORDER — CEFAZOLIN SODIUM 1 G IJ SOLR
INTRAMUSCULAR | Status: DC | PRN
  Administered 2016-03-03: 2 g via INTRAMUSCULAR

## 2016-03-03 MED ORDER — HYDROCODONE-ACETAMINOPHEN 5-325 MG PO TABS
1.0000 | ORAL_TABLET | ORAL | Status: DC | PRN
  Administered 2016-03-04 – 2016-03-05 (×4): 1 via ORAL
  Filled 2016-03-03 (×4): qty 1

## 2016-03-03 MED ORDER — MIDAZOLAM HCL 2 MG/2ML IJ SOLN
INTRAMUSCULAR | Status: AC
  Filled 2016-03-03: qty 2

## 2016-03-03 MED ORDER — HYDROMORPHONE HCL 1 MG/ML IJ SOLN: 0.2500 mg | INTRAMUSCULAR | Status: DC | PRN

## 2016-03-03 MED ORDER — PANTOPRAZOLE SODIUM 40 MG IV SOLR
40.0000 mg | Freq: Every day | INTRAVENOUS | Status: DC
  Administered 2016-03-03 – 2016-03-05 (×3): 40 mg via INTRAVENOUS
  Filled 2016-03-03 (×3): qty 40

## 2016-03-03 MED ORDER — SODIUM CHLORIDE 0.9 % IV SOLN
500.0000 mg | Freq: Two times a day (BID) | INTRAVENOUS | Status: DC
  Administered 2016-03-03 – 2016-03-06 (×6): 500 mg via INTRAVENOUS
  Filled 2016-03-03 (×6): qty 5

## 2016-03-03 MED ORDER — BACITRACIN ZINC 500 UNIT/GM EX OINT
TOPICAL_OINTMENT | CUTANEOUS | Status: DC | PRN
  Administered 2016-03-03: 1 via TOPICAL

## 2016-03-03 MED ORDER — FENTANYL CITRATE (PF) 100 MCG/2ML IJ SOLN
INTRAMUSCULAR | Status: AC
  Administered 2016-03-03: 50 ug via INTRAVENOUS
  Filled 2016-03-03: qty 2

## 2016-03-03 MED ORDER — DEXAMETHASONE SODIUM PHOSPHATE 4 MG/ML IJ SOLN: 4.0000 mg | Freq: Three times a day (TID) | INTRAMUSCULAR | Status: DC

## 2016-03-03 MED ORDER — ESMOLOL HCL 100 MG/10ML IV SOLN
INTRAVENOUS | Status: AC
  Filled 2016-03-03: qty 10

## 2016-03-03 MED ORDER — PROPOFOL 10 MG/ML IV BOLUS
INTRAVENOUS | Status: AC
  Filled 2016-03-03: qty 20

## 2016-03-03 MED ORDER — THROMBIN 5000 UNITS EX SOLR
CUTANEOUS | Status: AC
  Filled 2016-03-03: qty 5000

## 2016-03-03 MED ORDER — ACETAMINOPHEN 650 MG RE SUPP: 650.0000 mg | RECTAL | Status: DC | PRN

## 2016-03-03 MED ORDER — ONDANSETRON HCL 4 MG PO TABS: 4.0000 mg | ORAL_TABLET | ORAL | Status: DC | PRN

## 2016-03-03 MED ORDER — LIDOCAINE HCL (CARDIAC) 20 MG/ML IV SOLN: INTRAVENOUS | Status: DC | PRN

## 2016-03-03 MED ORDER — CEFAZOLIN SODIUM 1 G IJ SOLR
INTRAMUSCULAR | Status: AC
  Filled 2016-03-03: qty 20

## 2016-03-03 MED ORDER — FENTANYL CITRATE (PF) 100 MCG/2ML IJ SOLN
INTRAMUSCULAR | Status: DC | PRN
  Administered 2016-03-03: 50 ug via INTRAVENOUS
  Administered 2016-03-03: 150 ug via INTRAVENOUS
  Administered 2016-03-03 (×4): 50 ug via INTRAVENOUS

## 2016-03-03 MED ORDER — ACETAMINOPHEN 325 MG PO TABS
650.0000 mg | ORAL_TABLET | ORAL | Status: DC | PRN
  Filled 2016-03-03: qty 2

## 2016-03-03 MED ORDER — BACITRACIN ZINC 500 UNIT/GM EX OINT
TOPICAL_OINTMENT | CUTANEOUS | Status: AC
  Filled 2016-03-03: qty 28.35

## 2016-03-03 MED ORDER — FENTANYL CITRATE (PF) 100 MCG/2ML IJ SOLN
50.0000 ug | Freq: Once | INTRAMUSCULAR | Status: AC
  Administered 2016-03-03: 50 ug via INTRAVENOUS

## 2016-03-03 MED ORDER — SUGAMMADEX SODIUM 200 MG/2ML IV SOLN
INTRAVENOUS | Status: DC | PRN
  Administered 2016-03-03: 200 mg via INTRAVENOUS

## 2016-03-03 MED ORDER — SODIUM CHLORIDE 0.9 % IV SOLN
INTRAVENOUS | Status: DC
  Administered 2016-03-03: 15:00:00 via INTRAVENOUS

## 2016-03-03 MED ORDER — LIDOCAINE-EPINEPHRINE (PF) 2 %-1:200000 IJ SOLN
INTRAMUSCULAR | Status: DC | PRN
  Administered 2016-03-03: 10 mL

## 2016-03-03 MED ORDER — ROCURONIUM BROMIDE 100 MG/10ML IV SOLN
INTRAVENOUS | Status: DC | PRN
  Administered 2016-03-03 (×2): 50 mg via INTRAVENOUS

## 2016-03-03 MED ORDER — DEXAMETHASONE SODIUM PHOSPHATE 4 MG/ML IJ SOLN: 4.0000 mg | Freq: Four times a day (QID) | INTRAMUSCULAR | Status: DC

## 2016-03-03 MED ORDER — THROMBIN 20000 UNITS EX SOLR
CUTANEOUS | Status: AC
  Filled 2016-03-03: qty 20000

## 2016-03-03 MED ORDER — LIDOCAINE 2% (20 MG/ML) 5 ML SYRINGE
INTRAMUSCULAR | Status: AC
  Filled 2016-03-03: qty 5

## 2016-03-03 MED ORDER — HEMOSTATIC AGENTS (NO CHARGE) OPTIME
TOPICAL | Status: DC | PRN
  Administered 2016-03-03: 1 via TOPICAL

## 2016-03-03 MED ORDER — CEFAZOLIN IN D5W 1 GM/50ML IV SOLN
1.0000 g | Freq: Three times a day (TID) | INTRAVENOUS | Status: AC
  Administered 2016-03-04 (×2): 1 g via INTRAVENOUS
  Filled 2016-03-03 (×2): qty 50

## 2016-03-03 MED ORDER — LIDOCAINE HCL 2 % EX GEL
CUTANEOUS | Status: DC | PRN
  Administered 2016-03-03: 1 via URETHRAL

## 2016-03-03 MED ORDER — GELATIN ABSORBABLE MT POWD
OROMUCOSAL | Status: DC | PRN
  Administered 2016-03-03 (×2): via TOPICAL

## 2016-03-03 MED ORDER — SODIUM CHLORIDE 0.9 % IV SOLN: Freq: Once | INTRAVENOUS | Status: DC

## 2016-03-03 MED ORDER — PROPOFOL 10 MG/ML IV BOLUS
INTRAVENOUS | Status: DC | PRN
  Administered 2016-03-03: 20 mg via INTRAVENOUS
  Administered 2016-03-03: 130 mg via INTRAVENOUS

## 2016-03-03 MED ORDER — THROMBIN 5000 UNITS EX SOLR
CUTANEOUS | Status: AC
  Filled 2016-03-03: qty 10000

## 2016-03-03 MED ORDER — DEXAMETHASONE SODIUM PHOSPHATE 10 MG/ML IJ SOLN
INTRAMUSCULAR | Status: DC | PRN
  Administered 2016-03-03: 10 mg via INTRAVENOUS

## 2016-03-03 MED ORDER — LIDOCAINE-EPINEPHRINE (PF) 2 %-1:200000 IJ SOLN
INTRAMUSCULAR | Status: AC
  Filled 2016-03-03: qty 20

## 2016-03-03 MED ORDER — MORPHINE SULFATE (PF) 2 MG/ML IV SOLN
1.0000 mg | INTRAVENOUS | Status: DC | PRN
  Administered 2016-03-04: 2 mg via INTRAVENOUS
  Administered 2016-03-04: 1 mg via INTRAVENOUS
  Administered 2016-03-05: 2 mg via INTRAVENOUS
  Filled 2016-03-03 (×3): qty 1

## 2016-03-03 MED ORDER — ONDANSETRON HCL 4 MG/2ML IJ SOLN: 4.0000 mg | INTRAMUSCULAR | Status: DC | PRN

## 2016-03-03 MED ORDER — PROMETHAZINE HCL 25 MG PO TABS: 12.5000 mg | ORAL_TABLET | ORAL | Status: DC | PRN

## 2016-03-03 SURGICAL SUPPLY — 72 items
BAG DECANTER FOR FLEXI CONT (MISCELLANEOUS) ×1 IMPLANT
BLADE CLIPPER SPEC (BLADE) ×1 IMPLANT
BUR SPIRAL ROUTER 2.3 (BUR) ×2 IMPLANT
BUR TAPERED ROUTER 3.0 (BURR) ×1 IMPLANT
CANISTER SUCT 3000ML PPV (MISCELLANEOUS) ×4 IMPLANT
CARTRIDGE OIL MAESTRO DRILL (MISCELLANEOUS) ×1 IMPLANT
CATH COUDE FOLEY 2W 5CC 16FR (CATHETERS) ×1 IMPLANT
CATH COUDE FOLEY 2W 5CC 18FR (CATHETERS) ×1 IMPLANT
CLIP TI MEDIUM 6 (CLIP) IMPLANT
CONT SPEC 4OZ CLIKSEAL STRL BL (MISCELLANEOUS) ×4 IMPLANT
COVER BACK TABLE 60X90IN (DRAPES) IMPLANT
DIFFUSER DRILL AIR PNEUMATIC (MISCELLANEOUS) ×2 IMPLANT
DRAIN SNY WOU 7FLT (WOUND CARE) ×1 IMPLANT
DRAPE MICROSCOPE LEICA (MISCELLANEOUS) IMPLANT
DRAPE NEUROLOGICAL W/INCISE (DRAPES) ×2 IMPLANT
DRAPE SURG 17X23 STRL (DRAPES) IMPLANT
DRAPE WARM FLUID 44X44 (DRAPE) ×2 IMPLANT
DURAMATRIX ONLAY 3X3 (Plate) ×1 IMPLANT
DURAPREP 6ML APPLICATOR 50/CS (WOUND CARE) ×2 IMPLANT
ELECT REM PT RETURN 9FT ADLT (ELECTROSURGICAL) ×2
ELECTRODE REM PT RTRN 9FT ADLT (ELECTROSURGICAL) ×1 IMPLANT
EVACUATOR 1/8 PVC DRAIN (DRAIN) IMPLANT
EVACUATOR SILICONE 100CC (DRAIN) ×1 IMPLANT
FORCEPS BIPOLAR SPETZLER 8 1.0 (NEUROSURGERY SUPPLIES) ×1 IMPLANT
GAUZE SPONGE 4X4 12PLY STRL (GAUZE/BANDAGES/DRESSINGS) ×1 IMPLANT
GAUZE SPONGE 4X4 16PLY XRAY LF (GAUZE/BANDAGES/DRESSINGS) IMPLANT
GLOVE BIO SURGEON STRL SZ8 (GLOVE) ×3 IMPLANT
GOWN STRL REUS W/ TWL LRG LVL3 (GOWN DISPOSABLE) IMPLANT
GOWN STRL REUS W/ TWL XL LVL3 (GOWN DISPOSABLE) IMPLANT
GOWN STRL REUS W/TWL 2XL LVL3 (GOWN DISPOSABLE) ×4 IMPLANT
GOWN STRL REUS W/TWL LRG LVL3 (GOWN DISPOSABLE) ×2
GOWN STRL REUS W/TWL XL LVL3 (GOWN DISPOSABLE) ×2
HEMOSTAT POWDER KIT SURGIFOAM (HEMOSTASIS) ×2 IMPLANT
IV NS 1000ML (IV SOLUTION) ×2
IV NS 1000ML BAXH (IV SOLUTION) IMPLANT
KIT BASIN OR (CUSTOM PROCEDURE TRAY) ×2 IMPLANT
KIT ROOM TURNOVER OR (KITS) ×2 IMPLANT
MARKER SPHERE PSV REFLC 13MM (MARKER) ×2 IMPLANT
NEEDLE HYPO 22GX1.5 SAFETY (NEEDLE) ×2 IMPLANT
NS IRRIG 1000ML POUR BTL (IV SOLUTION) ×3 IMPLANT
OIL CARTRIDGE MAESTRO DRILL (MISCELLANEOUS) ×2
PACK CRANIOTOMY (CUSTOM PROCEDURE TRAY) ×2 IMPLANT
PAD ARMBOARD 7.5X6 YLW CONV (MISCELLANEOUS) ×3 IMPLANT
PATTIES SURGICAL .25X.25 (GAUZE/BANDAGES/DRESSINGS) IMPLANT
PATTIES SURGICAL .5 X.5 (GAUZE/BANDAGES/DRESSINGS) IMPLANT
PATTIES SURGICAL .5 X3 (DISPOSABLE) ×1 IMPLANT
PATTIES SURGICAL 1X1 (DISPOSABLE) IMPLANT
PERFORATOR CRAN ADLT SML 11X7 (MISCELLANEOUS) ×1 IMPLANT
PERFORATOR LRG  14-11MM (BIT) ×1
PERFORATOR LRG 14-11MM (BIT) ×1 IMPLANT
PIN MAYFIELD SKULL DISP (PIN) ×1 IMPLANT
PLATE 1.5  2HOLE LNG NEURO (Plate) ×2 IMPLANT
PLATE 1.5 2HOLE LNG NEURO (Plate) IMPLANT
PLATE 1.5/0.5 18.5MM BURR HOLE (Plate) ×1 IMPLANT
RUBBERBAND STERILE (MISCELLANEOUS) IMPLANT
SCREW SELF DRILL HT 1.5/4MM (Screw) ×8 IMPLANT
SET TUBING W/EXT DISP (INSTRUMENTS) ×1 IMPLANT
SPECIMEN JAR SMALL (MISCELLANEOUS) IMPLANT
SPONGE NEURO XRAY DETECT 1X3 (DISPOSABLE) ×1 IMPLANT
SPONGE SURGIFOAM ABS GEL 100 (HEMOSTASIS) ×3 IMPLANT
STAPLER VISISTAT 35W (STAPLE) ×1 IMPLANT
SUT ETHILON 3 0 FSL (SUTURE) IMPLANT
SUT NURALON 4 0 TR CR/8 (SUTURE) ×6 IMPLANT
SUT VIC AB 2-0 CP2 18 (SUTURE) ×3 IMPLANT
SYR CONTROL 10ML LL (SYRINGE) ×1 IMPLANT
TAPE CLOTH SURG 4X10 WHT LF (GAUZE/BANDAGES/DRESSINGS) ×1 IMPLANT
TIP STRAIGHT 25KHZ (INSTRUMENTS) ×1 IMPLANT
TOWEL OR 17X24 6PK STRL BLUE (TOWEL DISPOSABLE) ×2 IMPLANT
TOWEL OR 17X26 10 PK STRL BLUE (TOWEL DISPOSABLE) ×2 IMPLANT
TRAY FOLEY W/METER SILVER 16FR (SET/KITS/TRAYS/PACK) ×2 IMPLANT
UNDERPAD 30X30 (UNDERPADS AND DIAPERS) IMPLANT
WATER STERILE IRR 1000ML POUR (IV SOLUTION) ×2 IMPLANT

## 2016-03-03 NOTE — Anesthesia Procedure Notes (Signed)
Central Venous Catheter Insertion Performed by: Roderic Palau, anesthesiologist Start/End2/08/2016 3:20 PM, 03/03/2016 3:30 PM Patient location: Pre-op. Preanesthetic checklist: patient identified, IV checked, site marked, risks and benefits discussed, surgical consent, monitors and equipment checked, pre-op evaluation, timeout performed and anesthesia consent Position: Trendelenburg Lidocaine 1% used for infiltration and patient sedated Hand hygiene performed , maximum sterile barriers used  and Seldinger technique used Catheter size: 8 Fr Total catheter length 16. Central line was placed.Double lumen Procedure performed without using ultrasound guided technique. Attempts: 1 Following insertion, dressing applied, line sutured and Biopatch. Post procedure assessment: blood return through all ports  Patient tolerated the procedure well with no immediate complications.

## 2016-03-03 NOTE — Transfer of Care (Signed)
Immediate Anesthesia Transfer of Care Note  Patient: Kyle Chan  Procedure(s) Performed: Procedure(s): LEFT CRANIOTOMY FOR BRAIN TUMOR (Left)  Patient Location: PACU  Anesthesia Type:General  Level of Consciousness: sedated, patient cooperative and responds to stimulation  Airway & Oxygen Therapy: Patient Spontanous Breathing and Patient connected to face mask oxygen  Post-op Assessment: Report given to RN, Post -op Vital signs reviewed and stable and Patient moving all extremities X 4  Post vital signs: Reviewed and stable  Last Vitals:  Vitals:   03/03/16 1300 03/03/16 1400  BP: 123/72 121/75  Pulse:  (!) 59  Resp: (!) 21 19  Temp:      Last Pain:  Vitals:   03/03/16 1200  TempSrc: Oral  PainSc:          Complications: No apparent anesthesia complications

## 2016-03-03 NOTE — Anesthesia Postprocedure Evaluation (Addendum)
Anesthesia Post Note  Patient: LACHLAN SPEIR  Procedure(s) Performed: Procedure(s) (LRB): LEFT CRANIOTOMY FOR BRAIN TUMOR (Left)  Patient location during evaluation: PACU Anesthesia Type: General Level of consciousness: awake and alert Pain management: pain level controlled Vital Signs Assessment: post-procedure vital signs reviewed and stable Respiratory status: spontaneous breathing, nonlabored ventilation, respiratory function stable and patient connected to nasal cannula oxygen Cardiovascular status: blood pressure returned to baseline and stable Postop Assessment: no signs of nausea or vomiting Anesthetic complications: no       Last Vitals:  Vitals:   03/03/16 2200 03/03/16 2215  BP: 130/71 127/73  Pulse: (!) 58 68  Resp: 20 20  Temp:      Last Pain:  Vitals:   03/03/16 2215  TempSrc:   PainSc: 0-No pain                 Effie Berkshire

## 2016-03-03 NOTE — OR Nursing (Signed)
Unable to place standard 16 french foley catheter. Per Dr. Ronnald Ramp, I attempted a 68 french coude and was unable to advance and get urine return.  Dr. Ronnald Ramp attempted by first using a lidocaine uroject and the 16 french coude.  He also was unable to advance the catheter.  After these attempts some bloody discharge was noted. The urologist on call was consulted and per his advice I was able to place an 18 french coude, initially draining pink tinged urine then clear yellow urine.

## 2016-03-03 NOTE — Anesthesia Preprocedure Evaluation (Addendum)
Anesthesia Evaluation  Patient identified by MRN, date of birth, ID band Patient awake    Reviewed: Allergy & Precautions, H&P , NPO status , Patient's Chart, lab work & pertinent test results  Airway Mallampati: I  TM Distance: >3 FB Neck ROM: Full    Dental no notable dental hx. (+) Dental Advisory Given, Edentulous Upper   Pulmonary neg pulmonary ROS, former smoker,    Pulmonary exam normal breath sounds clear to auscultation       Cardiovascular hypertension, Pt. on medications  Rhythm:Regular Rate:Normal     Neuro/Psych Brain tumor negative psych ROS   GI/Hepatic negative GI ROS, Neg liver ROS,   Endo/Other  negative endocrine ROS  Renal/GU negative Renal ROS  negative genitourinary   Musculoskeletal   Abdominal   Peds  Hematology negative hematology ROS (+)   Anesthesia Other Findings   Reproductive/Obstetrics negative OB ROS                           Anesthesia Physical Anesthesia Plan  ASA: III  Anesthesia Plan: General   Post-op Pain Management:    Induction: Intravenous  Airway Management Planned: Oral ETT  Additional Equipment: Arterial line and CVP  Intra-op Plan:   Post-operative Plan: Extubation in OR and Possible Post-op intubation/ventilation  Informed Consent: I have reviewed the patients History and Physical, chart, labs and discussed the procedure including the risks, benefits and alternatives for the proposed anesthesia with the patient or authorized representative who has indicated his/her understanding and acceptance.   Dental advisory given  Plan Discussed with: CRNA  Anesthesia Plan Comments:         Anesthesia Quick Evaluation

## 2016-03-03 NOTE — Progress Notes (Addendum)
Patient ID: Kyle Chan, male   DOB: 03-05-1965, 51 y.o.   MRN: [id number] I have spoken with the patient and his wife and family at length about the findings on the MRI. He has a large left frontotemporal meningioma. I have recommended resection of the tumor today. His speech is better and he appears neurologically normal at present. I have described the surgery as best I can. We have considered preoperative embolization. However I think that the main blood supply will be the middle meningeal artery which will be taken during the exposure.  They understand the risks of the surgery which include but are not limited to bleeding, infection, stroke, loss of speech, loss of vision, numbness tingling weakness or paralysis, lack of relief of symptoms, worsening symptoms, need for further surgery, and anesthesia risk including DVT pneumonia MI and death. They agree to proceed.

## 2016-03-03 NOTE — Care Management Note (Signed)
Case Management Note  Patient Details  Name: Kyle Chan MRN: [id number] Date of Birth: 28-Apr-1965  Subjective/Objective:  Pt admitted on 03/02/16 with inability to speak; found to have large Lt sided brain mass in the frontal and temporal region on CT.  PTA, pt independent, lives with spouse.                    Action/Plan: Pt to OR later today for resection of tumor.  Will follow postoperatively for discharge needs.    Expected Discharge Date:                  Expected Discharge Plan:  Home/Self Care  In-House Referral:     Discharge planning Services  CM Consult  Post Acute Care Choice:    Choice offered to:     DME Arranged:    DME Agency:     HH Arranged:    HH Agency:     Status of Service:  In process, will continue to follow  If discussed at Long Length of Stay Meetings, dates discussed:    Additional Comments:  Reinaldo Raddle, RN, BSN  Trauma/Neuro ICU Case Manager [phone]

## 2016-03-03 NOTE — Anesthesia Procedure Notes (Signed)
Procedure Name: Intubation Date/Time: 03/03/2016 4:13 PM Performed by: Jenne Campus Pre-anesthesia Checklist: Patient identified, Emergency Drugs available, Suction available and Patient being monitored Patient Re-evaluated:Patient Re-evaluated prior to inductionOxygen Delivery Method: Circle System Utilized Preoxygenation: Pre-oxygenation with 100% oxygen Intubation Type: IV induction Ventilation: Mask ventilation without difficulty and Oral airway inserted - appropriate to patient size Laryngoscope Size: Miller and 3 Tube type: Oral Tube size: 7.5 mm Number of attempts: 1 Airway Equipment and Method: Stylet and Oral airway Placement Confirmation: ETT inserted through vocal cords under direct vision,  positive ETCO2 and breath sounds checked- equal and bilateral Secured at: 23 cm Tube secured with: Tape Dental Injury: Teeth and Oropharynx as per pre-operative assessment

## 2016-03-03 NOTE — Op Note (Signed)
03/02/2016 - 03/03/2016  8:12 PM  PATIENT:  Kyle Chan  51 y.o. male  PRE-OPERATIVE DIAGNOSIS:  Left frontotemporal extra-axial brain mass  POST-OPERATIVE DIAGNOSIS:  Same, was in section consistent with meningioma  PROCEDURE:  Left frontotemporal craniotomy for resection of meningioma utilizing frameless stereotactic guidance  SURGEON:  Sherley Bounds, MD  ASSISTANTS: DR Christella Noa  ANESTHESIA:   General  EBL: 200 ml  Total I/O In: 1000 [I.V.:1000] Out: -   BLOOD ADMINISTERED: none  DRAINS: jp  SPECIMEN:  none  INDICATION FOR PROCEDURE: This patient presented with difficulty with speech. Imaging showed large left-sided meningioma.  Recommended craniotomy for resection of the lesion. Patient understood the risks, benefits, and alternatives and potential outcomes and wished to proceed.  PROCEDURE DETAILS: The patient was taken to the operating room and after induction of adequate generalized endotracheal anesthesia, the head was affixed in a 3 point Mayfield head rest, and turned to the right to expose the left frontotemporal parietal region. Brain lab frameless stereotaxis was used and we registered points and then used that to mark the outline of the tumor so that we could determine our incision. The head was  cleaned and then prepped with DuraPrep and draped in the usual sterile fashion. 10 cc of local anesthetic was injected, and a left curvilinear incision was made on the left of the head. Raney clips were placed to establish hemostasis of the scalp, the muscle was reflected with the scalp flap, to expose the left frontotemporal region. A burr hole was placed, and a craniotomy flap was turned utilizing the high-speed, air powered drill. The flap was then placed in bacitracin-containing saline solution, and the dura was opened to expose left frontotemporal region. I opened the dura circumferentially around the edges of the tumor. I worked in the plane between the brain and the tumor.  It was a large tumor and therefore I used the [place] to debulk the interior of the tumor. I was then able to work around the edges circumferentially and worked toward the bottom of the tumor. Cottonoids were placed between the brain and the tumor to protect the brain surface. Great care was taken to gently dissect the tumor away from the brain in order to protect the speech area. There was a vascular pedicle at the bottom of the tumor that was coagulated and cut sharply. The tumor was removed en bloc. Frozen section was consistent with meningioma.  Once the tumor was removed I dried the surgical bed with bipolar cautery and  Surgifoam, and then lined the surgical bed with Surgicel. I sewed a dural patch graft into the dural defect. Dural tack ups were used. The dura was lined with Gelfoam, and the craniotomy flap was replaced with doggie-bone plates. The wound was copiously irrigated. A subgaleal drain was placed, and the galea was then closed with interrupted 2-0 Vicryl suture. The skin was then closed with staples a sterile dressing was applied. The patient was then taken out of the 3-point Mayfield headrest and awakened from general anesthesia, and transported to the recovery room in stable condition. At the end of the procedure all sponge, needle, and instrument counts were correct.     PLAN OF CARE: Admit to inpatient   PATIENT DISPOSITION:  PACU - hemodynamically stable.   Delay start of Pharmacological VTE agent (>24hrs) due to surgical blood loss or risk of bleeding:  yes

## 2016-03-04 ENCOUNTER — Encounter (HOSPITAL_COMMUNITY): Payer: Self-pay | Admitting: Neurological Surgery

## 2016-03-04 ENCOUNTER — Inpatient Hospital Stay (HOSPITAL_COMMUNITY): Payer: Commercial Managed Care - HMO

## 2016-03-04 LAB — POCT I-STAT 7, (LYTES, BLD GAS, ICA,H+H)
Acid-base deficit: 2 mmol/L (ref 0.0–2.0)
Bicarbonate: 24.1 mmol/L (ref 20.0–28.0)
Calcium, Ion: 1.24 mmol/L (ref 1.15–1.40)
HEMATOCRIT: 40 % (ref 39.0–52.0)
Hemoglobin: 13.6 g/dL (ref 13.0–17.0)
O2 Saturation: 98 %
PCO2 ART: 44.8 mmHg (ref 32.0–48.0)
PO2 ART: 115 mmHg — AB (ref 83.0–108.0)
POTASSIUM: 3.7 mmol/L (ref 3.5–5.1)
Patient temperature: 35.7
Sodium: 144 mmol/L (ref 135–145)
TCO2: 26 mmol/L (ref 0–100)
pH, Arterial: 7.332 — ABNORMAL LOW (ref 7.350–7.450)

## 2016-03-04 LAB — ABO/RH: ABO/RH(D): B POS

## 2016-03-04 MED ORDER — CHLORHEXIDINE GLUCONATE CLOTH 2 % EX PADS: 6.0000 | MEDICATED_PAD | Freq: Every day | CUTANEOUS | Status: DC

## 2016-03-04 MED ORDER — GADOBENATE DIMEGLUMINE 529 MG/ML IV SOLN
20.0000 mL | Freq: Once | INTRAVENOUS | Status: AC | PRN
  Administered 2016-03-04: 20 mL via INTRAVENOUS

## 2016-03-04 MED FILL — Thrombin For Soln 5000 Unit: CUTANEOUS | Qty: 5000 | Status: AC

## 2016-03-04 NOTE — Evaluation (Signed)
Physical Therapy Evaluation Patient Details Name: Kyle Chan MRN: [id number] DOB: 10/18/1965 Today's Date: 03/04/2016   History of Present Illness  51 year old African American male without any medical history presented with acute onset of aphasia, now s/p left frontotemporal craniotomy 2/8 for resection of meningioma.  Preoperative MRI shows large left convexity meningioma with mass effect on the underlying left frontal and temporal lobes, along with minimal adjacent edema.    Clinical Impression  Patient presents with mild instability and decreased safety awareness and will benefit from skilled PT in the acute setting to allow return home with family support.  Likely no follow up needs.  Will assess stairs and functional balance next session.     Follow Up Recommendations No PT follow up    Equipment Recommendations  None recommended by PT    Recommendations for Other Services       Precautions / Restrictions Precautions Precautions: Fall      Mobility  Bed Mobility Overal bed mobility: Needs Assistance Bed Mobility: Supine to Sit     Supine to sit: Supervision     General bed mobility comments: assist for safety cues for slowing down for safe positioning of monitoring devices and drain  Transfers Overall transfer level: Needs assistance Equipment used: None Transfers: Sit to/from Stand Sit to Stand: Min guard         General transfer comment: for safety, initial balance  Ambulation/Gait Ambulation/Gait assistance: Min guard Ambulation Distance (Feet): 200 Feet Assistive device: None Gait Pattern/deviations: Step-through pattern;Decreased stride length     General Gait Details: slower pace  first time up, no LOB or evidence for focal weakness  Stairs            Wheelchair Mobility    Modified Rankin (Stroke Patients Only)       Balance Overall balance assessment: Needs assistance             Standing balance comment: stands feet  together eyes closed 30 sec minimal sway; alternate step taps with feet no support, turns 360 with increased time, forward reach at least 5" outside BOS                             Pertinent Vitals/Pain Pain Assessment: No/denies pain    Home Living Family/patient expects to be discharged to:: Private residence Living Arrangements: Spouse/significant other Available Help at Discharge: Family;Available 24 hours/day Type of Home: House Home Access: Level entry     Home Layout: Two level;Able to live on main level with bedroom/bathroom Home Equipment: Shower seat - built in      Prior Function Level of Independence: Independent               Hand Dominance        Extremity/Trunk Assessment   Upper Extremity Assessment Upper Extremity Assessment: Defer to OT evaluation    Lower Extremity Assessment Lower Extremity Assessment: Overall WFL for tasks assessed       Communication   Communication: Expressive difficulties;Receptive difficulties (aphasia)  Cognition Arousal/Alertness: Awake/alert Behavior During Therapy: WFL for tasks assessed/performed;Impulsive Overall Cognitive Status: Difficult to assess                      General Comments General comments (skin integrity, edema, etc.): wife present throughout    Exercises     Assessment/Plan    PT Assessment Patient needs continued PT services  PT Problem List Decreased balance;Decreased safety  awareness;Decreased knowledge of precautions          PT Treatment Interventions Gait training;Stair training;Balance training;Functional mobility training;Patient/family education;Therapeutic activities    PT Goals (Current goals can be found in the Care Plan section)  Acute Rehab PT Goals Patient Stated Goal: To go home PT Goal Formulation: With patient/family Time For Goal Achievement: 03/08/16 Potential to Achieve Goals: Good    Frequency Min 3X/week   Barriers to discharge         Co-evaluation               End of Session Equipment Utilized During Treatment: Gait belt Activity Tolerance: Patient tolerated treatment well Patient left: in bed;with family/visitor present Nurse Communication: Mobility status         Time: [id number] PT Time Calculation (min) (ACUTE ONLY): 19 min   Charges:   PT Evaluation $PT Eval Moderate Complexity: 1 Procedure     PT G CodesReginia Naas 03-22-16, 4:55 PM Magda Kiel, [place] 2016-03-22

## 2016-03-04 NOTE — Progress Notes (Signed)
Patient ID: Kyle Chan, male   DOB: 14-Dec-1965, 51 y.o.   MRN: [id number] Subjective: Patient is awake alert and moving all ext, states name but has naming difficulties, good reception  Objective: Vital signs in last 24 hours: Temp:  [97.6 F (36.4 C)-99.2 F (37.3 C)] 98.6 F (37 C) (02/09 0400) Pulse Rate:  [50-72] 55 (02/09 0700) Resp:  [16-24] 22 (02/09 0700) BP: (108-143)/(51-92) 114/60 (02/09 0700) SpO2:  [92 %-100 %] 94 % (02/09 0700) Arterial Line BP: (131-141)/(59-66) 131/59 (02/08 2045)  Intake/Output from previous day: 02/08 0701 - 02/09 0700 In: 3432.5 [I.V.:3382.5; IV Piggyback:50] Out: 2885 [Urine:2120; Drains:165; Blood:600] Intake/Output this shift: No intake/output data recorded.  Neurologic: Grossly normal save expressive aphasia  Lab Results: Lab Results  Component Value Date   WBC 12.7 (H) 03/02/2016   HGB 13.6 03/03/2016   HCT 40.0 03/03/2016   MCV 88.0 03/02/2016   PLT 225 03/02/2016   Lab Results  Component Value Date   INR 0.99 03/02/2016   BMET Lab Results  Component Value Date   NA 144 03/03/2016   K 3.7 03/03/2016   CL 108 03/02/2016   CO2 21 (L) 03/02/2016   GLUCOSE 125 (H) 03/02/2016   BUN 12 03/02/2016   CREATININE 0.90 03/02/2016   CALCIUM 9.5 03/02/2016    Studies/Results: Ct Angio Head W Or Wo Contrast  Result Date: 03/02/2016 CLINICAL DATA:  Sudden onset expressive aphasia and confusion. EXAM: CT ANGIOGRAPHY HEAD AND NECK TECHNIQUE: Multidetector CT imaging of the head and neck was performed using the standard protocol during bolus administration of intravenous contrast. Multiplanar CT image reconstructions and MIPs were obtained to evaluate the vascular anatomy. Carotid stenosis measurements (when applicable) are obtained utilizing NASCET criteria, using the distal internal carotid diameter as the denominator. CONTRAST:  50 mL Isovue 370 COMPARISON:  Noncontrast head CT earlier today FINDINGS: CTA NECK FINDINGS Aortic arch: 3  vessel aortic arch. Brachiocephalic and subclavian arteries are widely patent. Right carotid system: Patent without evidence of stenosis or dissection. Left carotid system: Patent without evidence of stenosis or dissection. Vertebral arteries: Patent without evidence of stenosis or dissection. Left vertebral artery is dominant. Skeleton: No acute osseous abnormality or suspicious osseous lesion. Other neck: No mass or lymph node enlargement. Upper chest: Unremarkable. Review of the MIP images confirms the above findings CTA HEAD FINDINGS Anterior circulation: Internal carotid arteries are widely patent from skullbase to carotid termini. The cavernous segments are tortuous, particularly on the right. ACAs and MCAs are patent without evidence of flow limiting proximal stenosis. MCA branch vessel evaluation is limited by venous contamination as well as the large left frontal mass which medially displaces the left MCA sylvian branches. No aneurysm. Posterior circulation: The intracranial vertebral arteries are patent to the basilar with mild irregularity but no significant stenosis. PICA, AICA, and SCA origins are patent. Basilar artery is widely patent. Posterior communicating arteries are diminutive or absent. PCAs are patent without evidence of significant stenosis. No aneurysm. Venous sinuses: Patent. Anatomic variants: None. Delayed phase: Mass centered in the left frontal operculum described on earlier CT demonstrates avid, largely homogeneous enhancement throughout aside from a small amount of hypoenhancement centrally and superiorly. Mass effect and midline shift with right lateral ventricular trapping as described on recent noncontrast CT. No additional enhancing lesions identified. Review of the MIP images confirms the above findings IMPRESSION: 1. No large vessel occlusion or significant stenosis in the intracranial or cervical arterial circulation. 2. 6.6 cm left frontal mass most consistent  with meningioma.  Electronically Signed   By: Logan Bores M.D.   On: 03/02/2016 11:36   Ct Angio Neck W Or Wo Contrast  Result Date: 03/02/2016 CLINICAL DATA:  Sudden onset expressive aphasia and confusion. EXAM: CT ANGIOGRAPHY HEAD AND NECK TECHNIQUE: Multidetector CT imaging of the head and neck was performed using the standard protocol during bolus administration of intravenous contrast. Multiplanar CT image reconstructions and MIPs were obtained to evaluate the vascular anatomy. Carotid stenosis measurements (when applicable) are obtained utilizing NASCET criteria, using the distal internal carotid diameter as the denominator. CONTRAST:  50 mL Isovue 370 COMPARISON:  Noncontrast head CT earlier today FINDINGS: CTA NECK FINDINGS Aortic arch: 3 vessel aortic arch. Brachiocephalic and subclavian arteries are widely patent. Right carotid system: Patent without evidence of stenosis or dissection. Left carotid system: Patent without evidence of stenosis or dissection. Vertebral arteries: Patent without evidence of stenosis or dissection. Left vertebral artery is dominant. Skeleton: No acute osseous abnormality or suspicious osseous lesion. Other neck: No mass or lymph node enlargement. Upper chest: Unremarkable. Review of the MIP images confirms the above findings CTA HEAD FINDINGS Anterior circulation: Internal carotid arteries are widely patent from skullbase to carotid termini. The cavernous segments are tortuous, particularly on the right. ACAs and MCAs are patent without evidence of flow limiting proximal stenosis. MCA branch vessel evaluation is limited by venous contamination as well as the large left frontal mass which medially displaces the left MCA sylvian branches. No aneurysm. Posterior circulation: The intracranial vertebral arteries are patent to the basilar with mild irregularity but no significant stenosis. PICA, AICA, and SCA origins are patent. Basilar artery is widely patent. Posterior communicating arteries are  diminutive or absent. PCAs are patent without evidence of significant stenosis. No aneurysm. Venous sinuses: Patent. Anatomic variants: None. Delayed phase: Mass centered in the left frontal operculum described on earlier CT demonstrates avid, largely homogeneous enhancement throughout aside from a small amount of hypoenhancement centrally and superiorly. Mass effect and midline shift with right lateral ventricular trapping as described on recent noncontrast CT. No additional enhancing lesions identified. Review of the MIP images confirms the above findings IMPRESSION: 1. No large vessel occlusion or significant stenosis in the intracranial or cervical arterial circulation. 2. 6.6 cm left frontal mass most consistent with meningioma. Electronically Signed   By: Logan Bores M.D.   On: 03/02/2016 11:36   Mr Jeri Cos And Wo Contrast  Result Date: 03/02/2016 CLINICAL DATA:  Speech difficulties EXAM: MRI HEAD WITHOUT AND WITH CONTRAST TECHNIQUE: Multiplanar, multiecho pulse sequences of the brain and surrounding structures were obtained without and with intravenous contrast. CONTRAST:  26mL MULTIHANCE GADOBENATE DIMEGLUMINE 529 MG/ML IV SOLN COMPARISON:  CTA head neck 03/02/2016 FINDINGS: Brain: No focal diffusion restriction to indicate acute infarct. No intraparenchymal hemorrhage. There is a large dural-based mass arising from the left convexity measuring 6.1 cm AP by 5.9 cm CC x 4.2 cm TV. There is 10 mm of rightward midline shift at the level of the foramina of [place]. The left lateral ventricle is compressed. There is minimal parenchymal vasogenic edema adjacent to the mass. No hydrocephalus or extra-axial fluid collection. The midline structures are normal. No age advanced or lobar predominant atrophy. Vascular: Major intracranial arterial and venous sinus flow voids are preserved. No evidence of chronic microhemorrhage or amyloid angiopathy. Skull and upper cervical spine: The visualized skull base, calvarium,  upper cervical spine and extracranial soft tissues are normal. Sinuses/Orbits: No fluid levels or advanced mucosal thickening. No  mastoid effusion. Normal orbits. IMPRESSION: Large left convexity meningioma with mass effect on the underlying left frontal and temporal lobes. Minimal adjacent edema and no acute ischemia. Electronically Signed   By: Ulyses Jarred M.D.   On: 03/02/2016 20:08   Ct Head Code Stroke W/o Cm  Result Date: 03/02/2016 CLINICAL DATA:  Code stroke. Sudden onset expressive aphasia and confusion. EXAM: CT HEAD WITHOUT CONTRAST TECHNIQUE: Contiguous axial images were obtained from the base of the skull through the vertex without intravenous contrast. COMPARISON:  11/18/2012 FINDINGS: Brain: There is an approximately 7 x 5 cm mass centered in the region of the left frontal operculum and insula. There is a small amount of calcification along the lateral margin of the mass just deep to the skull with evidence of some mild osseous remodeling. There is mass effect on the left basal ganglia with 12 mm of rightward midline shift. There is partial effacement of the right lateral ventricle. There is also new mild dilatation of the right lateral ventricle. There is no evidence of acute cortical infarct, intracranial hemorrhage, or extra-axial fluid collection. Vascular: No hyperdense vessel or unexpected calcification. Skull: No evidence of fracture or focal osseous lesion. Sinuses/Orbits: No acute finding. Other: None. ASPECTS [facility] Stroke Program Early CT Score): Not calculated due to the presence of a mass. IMPRESSION: 1. 7 cm left frontal mass most concerning for primary CNS neoplasm. Contrast-enhanced brain MRI recommended for further evaluation. 2. 12 mm midline shift. Mild trapping of the right lateral ventricle. The study was reviewed in person with Dr. Alver Fisher on 03/02/2016 at 10:51 a.m. Electronically Signed   By: Logan Bores M.D.   On: 03/02/2016 11:24    Assessment/Plan: Pleased with  progress, mobilize today, speech consult   LOS: 2 days    Kyle Chan,Kyle Chan 03/04/2016, 7:36 AM

## 2016-03-04 NOTE — Evaluation (Signed)
Clinical/Bedside Swallow Evaluation Patient Details  Name: Kyle Chan MRN: [id number] Date of Birth: 06-16-1965  Today's Date: 03/04/2016 Time: SLP Start Time (ACUTE ONLY): 1145 SLP Stop Time (ACUTE ONLY): 1155 SLP Time Calculation (min) (ACUTE ONLY): 10 min  Past Medical History:  Past Medical History:  Diagnosis Date  . Hypertension    Past Surgical History:  Past Surgical History:  Procedure Laterality Date  . CRANIOTOMY Left 03/03/2016   Procedure: LEFT CRANIOTOMY FOR BRAIN TUMOR;  Surgeon: Eustace Moore, MD;  Location: [place];  Service: Neurosurgery;  Laterality: Left;  . NASAL RECONSTRUCTION WITH SEPTAL REPAIR N/A 11/18/2012   Procedure: NASAL RECONSTRUCTION WITH SEPTAL REPAIR;  Surgeon: Melida Quitter, MD;  Location: [facility] OR;  Service: ENT;  Laterality: N/A;   HPI:  51 year old African American male without any medical history presented with acute onset of aphasia, now s/p left frontotemporal craniotomy 2/8 for resection of meningioma.  Preoperative MRI shows large left convexity meningioma with mass effect on the underlying left frontal and temporal lobes, along with minimal adjacent edema.    Assessment / Plan / Recommendation Clinical Impression  Pt has no overt signs of difficulty with all consistencies tested. He could not keep his upper dentures in place despite use of adhesive, but he had no difficulty masticating regular textures without them in place. Recommend regular diet textures and thin liquids. No SLP f/u indicated for swallowing. Please see SLP speech/language evaluation for additional f/u recommendations.    Aspiration Risk  Mild aspiration risk    Diet Recommendation Regular;Thin liquid   Liquid Administration via: Cup;Straw Medication Administration: Whole meds with liquid Supervision: Patient able to self feed;Intermittent supervision to cue for compensatory strategies Compensations: Slow rate;Small sips/bites;Minimize environmental distractions Postural  Changes: Seated upright at 90 degrees    Other  Recommendations Oral Care Recommendations: Oral care BID   Follow up Recommendations  (tba)      Frequency and Duration            Prognosis        Swallow Study   General HPI: 51 year old African American male without any medical history presented with acute onset of aphasia, now s/p left frontotemporal craniotomy 2/8 for resection of meningioma.  Preoperative MRI shows large left convexity meningioma with mass effect on the underlying left frontal and temporal lobes, along with minimal adjacent edema.  Type of Study: Bedside Swallow Evaluation Previous Swallow Assessment: none in chart Diet Prior to this Study: NPO Temperature Spikes Noted: No Respiratory Status: Room air History of Recent Intubation: Yes (for procedure only) Length of Intubations (days):  (for procedure only) Behavior/Cognition: Alert;Cooperative;Pleasant mood;Other (Comment) (aphasia) Oral Cavity Assessment: Within Functional Limits Oral Care Completed by SLP: No Oral Cavity - Dentition: Missing dentition;Other (Comment) (has upper dentures but they will not stay in place for exam) Vision: Functional for self-feeding Self-Feeding Abilities: Able to feed self Patient Positioning: Upright in bed Baseline Vocal Quality: Normal Volitional Swallow: Able to elicit    Oral/Motor/Sensory Function Overall Oral Motor/Sensory Function: Other (comment) (difficulty completing oral motor exam)   Ice Chips Ice chips: Not tested   Thin Liquid Thin Liquid: Within functional limits Presentation: Cup;Self Fed;Straw    Nectar Thick Nectar Thick Liquid: Not tested   Honey Thick Honey Thick Liquid: Not tested   Puree Puree: Within functional limits Presentation: Self Fed;Spoon   Solid   GO   Solid: Within functional limits Presentation: Self Fed        [name] 03/04/2016,3:27 PM  [name], M.A. CCC-SLP [phone]

## 2016-03-04 NOTE — Progress Notes (Signed)
OT Cancellation Note  Patient Details Name: Kyle Chan MRN: [id number] DOB: [birth year]   Cancelled Treatment:    Reason Eval/Treat Not Completed: Patient not medically ready (bedrest order set)  Peri Maris  [phone] 03/04/2016, 6:47 AM

## 2016-03-04 NOTE — Evaluation (Signed)
Speech Language Pathology Evaluation Patient Details Name: Kyle Chan MRN: [id number] DOB: 1965-02-02 Today's Date: 03/04/2016 Time: [id number] SLP Time Calculation (min) (ACUTE ONLY): 17 min  Problem List:  Patient Active Problem List   Diagnosis Date Noted  . S/P craniotomy 03/03/2016  . Brain mass 03/02/2016   Past Medical History:  Past Medical History:  Diagnosis Date  . Hypertension    Past Surgical History:  Past Surgical History:  Procedure Laterality Date  . CRANIOTOMY Left 03/03/2016   Procedure: LEFT CRANIOTOMY FOR BRAIN TUMOR;  Surgeon: Eustace Moore, MD;  Location: [place];  Service: Neurosurgery;  Laterality: Left;  . NASAL RECONSTRUCTION WITH SEPTAL REPAIR N/A 11/18/2012   Procedure: NASAL RECONSTRUCTION WITH SEPTAL REPAIR;  Surgeon: Melida Quitter, MD;  Location: [facility] OR;  Service: ENT;  Laterality: N/A;   HPI:  51 year old African American male without any medical history presented with acute onset of aphasia, now s/p left frontotemporal craniotomy 2/8 for resection of meningioma.  Preoperative MRI shows large left convexity meningioma with mass effect on the underlying left frontal and temporal lobes, along with minimal adjacent edema.    Assessment / Plan / Recommendation Clinical Impression  Pt has an expressive > receptive aphasia, with verbal expression marked by echolalia, phonemic paraphasias, and word finding errors. He seems to have adequate comprehension of basic information and instructions with minimal cueing, but as complexity increases he has more significant difficulty. Question some element of motor planning issues as well during attempted oral motor exam. SLP attempted to introduce communication boards (written words, pictures, alphabet), but pt needed Max cues for accurate use. Per RN, he seems to be communicating more efficiently with gestures. Recommend additional SLP f/u to maximize functional communication.    SLP Assessment  Patient needs continued  Speech Lanaguage Pathology Services    Follow Up Recommendations   (tba)    Frequency and Duration min 2x/week  2 weeks      SLP Evaluation Cognition  Overall Cognitive Status: Difficult to assess (aphasia) Arousal/Alertness: Awake/alert Orientation Level: Oriented to person;Other (comment) (expressive aphasia ) Attention: Sustained Sustained Attention: Appears intact Awareness: Appears intact       Comprehension  Auditory Comprehension Overall Auditory Comprehension: Impaired Yes/No Questions: Impaired Basic Biographical Questions: 76-100% accurate Basic Immediate Environment Questions: 75-100% accurate Complex Questions: 50-74% accurate Commands: Impaired One Step Basic Commands: 50-74% accurate Conversation: Simple Visual Recognition/Discrimination Discrimination: Exceptions to [facility] Pictures: Other (comment) (communication board) Reading Comprehension Reading Status: Impaired Word level: Impaired    Expression Expression Primary Mode of Expression: Verbal Verbal Expression Overall Verbal Expression: Impaired Initiation: No impairment Level of Generative/Spontaneous Verbalization: Word;Phrase Naming: Impairment Pictures: Other (comment) (communication board) Verbal Errors: Aware of errors;Echolalia;Phonemic paraphasias   Oral / Motor  Oral Motor/Sensory Function Overall Oral Motor/Sensory Function: Other (comment) (difficulty completing oral motor exam) Motor Speech Overall Motor Speech: Other (comment) (? motor planning difficulties)   GO                    [name] 03/04/2016, 3:36 PM  [name], M.A. CCC-SLP [phone]

## 2016-03-05 MED ORDER — DEXAMETHASONE 4 MG PO TABS
4.0000 mg | ORAL_TABLET | Freq: Four times a day (QID) | ORAL | Status: AC
  Administered 2016-03-05 (×4): 4 mg via ORAL
  Filled 2016-03-05 (×4): qty 1

## 2016-03-05 MED ORDER — DEXAMETHASONE 4 MG PO TABS
4.0000 mg | ORAL_TABLET | Freq: Three times a day (TID) | ORAL | Status: DC
  Administered 2016-03-06: 4 mg via ORAL
  Filled 2016-03-05: qty 1

## 2016-03-05 NOTE — Progress Notes (Signed)
Physical Therapy Treatment Patient Details Name: DONTAVIS FEIOCK MRN: [id number] DOB: 25-Dec-1965 Today's Date: 03/05/2016    History of Present Illness 51 year old African American male without any medical history presented with acute onset of aphasia, now s/p left frontotemporal craniotomy 2/8 for resection of meningioma.  Preoperative MRI shows large left convexity meningioma with mass effect on the underlying left frontal and temporal lobes, along with minimal adjacent edema.      PT Comments    Patient seen for mobility progression, tolerated activity well. Wife present during session. Performed higher level ambulation and balance tasks and full flight stair negotation without difficulty. Current POC remains appropriate.  Follow Up Recommendations  No PT follow up     Equipment Recommendations  None recommended by PT    Recommendations for Other Services       Precautions / Restrictions Precautions Precautions: Fall    Mobility  Bed Mobility Overal bed mobility: Needs Assistance Bed Mobility: Supine to Sit     Supine to sit: Supervision     General bed mobility comments: assist for safety cues for slowing down for safe positioning of monitoring devices and drain  Transfers Overall transfer level: Needs assistance Equipment used: None Transfers: Sit to/from Stand Sit to Stand: Min guard         General transfer comment: min guard for stability, no physical assist required  Ambulation/Gait Ambulation/Gait assistance: Min guard Ambulation Distance (Feet): 380 Feet Assistive device: None Gait Pattern/deviations: Step-through pattern;Decreased stride length   Gait velocity interpretation: at or above normal speed for age/gender General Gait Details: steady with ambulation, no overt LOB noted   Stairs Stairs: Yes   Stair Management: One rail Right;Alternating pattern Number of Stairs: 12 General stair comments: no physical assist, good speed and  stability  Wheelchair Mobility    Modified Rankin (Stroke Patients Only)       Balance Overall balance assessment: Needs assistance                           High level balance activites: Side stepping;Backward walking;Direction changes;Turns;Sudden stops;Head turns High Level Balance Comments: supervision for higher level balance tasks, no overt LOB noted    Cognition Arousal/Alertness: Awake/alert Behavior During Therapy: WFL for tasks assessed/performed;Impulsive Overall Cognitive Status: Difficult to assess                      Exercises      General Comments General comments (skin integrity, edema, etc.): wife present during session,       Pertinent Vitals/Pain Pain Assessment: No/denies pain    Home Living                      Prior Function            PT Goals (current goals can now be found in the care plan section) Acute Rehab PT Goals Patient Stated Goal: To go home PT Goal Formulation: With patient/family Time For Goal Achievement: 03/08/16 Potential to Achieve Goals: Good    Frequency    Min 3X/week      PT Plan      Co-evaluation             End of Session Equipment Utilized During Treatment: Gait belt Activity Tolerance: Patient tolerated treatment well Patient left: in bed;with family/visitor present     Time: [id number] PT Time Calculation (min) (ACUTE ONLY): 17 min  Charges:  $Gait Training:  8-22 mins                    G Codes:      Duncan Dull Mar 29, 2016, 10:10 AM Alben Deeds, PT DPT [phone]

## 2016-03-05 NOTE — Progress Notes (Signed)
Pt came to  Room 5c16 from ICU. Report called earlier. VSS. Telemetry placed. Wife at bedside. Alert, oriented to room.

## 2016-03-05 NOTE — Progress Notes (Signed)
Pt seen and examined. No issues overnight.  EXAM: Temp:  [98.9 F (37.2 C)-99.7 F (37.6 C)] 98.9 F (37.2 C) (02/10 0800) Pulse Rate:  [46-104] 60 (02/10 0700) Resp:  [14-28] 24 (02/10 0700) BP: (123-176)/(64-94) 125/68 (02/10 0700) SpO2:  [94 %-99 %] 97 % (02/10 0700) Intake/Output      02/09 0701 - 02/10 0700 02/10 0701 - 02/11 0700   P.O. 600    I.V. (mL/kg) 1650 (15.2)    IV Piggyback 260    Total Intake(mL/kg) 2510 (23.1)    Urine (mL/kg/hr) 1200 (0.5)    Drains 340 (0.1)    Blood     Total Output 1540     Net +970          Urine Occurrence 3 x     Awake and alert Expressive dysphasia Follows commands throughout Full strength  Stable Drain removed TTF Ambulate with therapy

## 2016-03-05 NOTE — Evaluation (Signed)
Occupational Therapy Evaluation Patient Details Name: OREST BROSH MRN: [id number] DOB: 01/23/1966 Today's Date: 03/05/2016    History of Present Illness 51 year old African American male without any medical history presented with acute onset of aphasia, now s/p left frontotemporal craniotomy 2/8 for resection of meningioma.  Preoperative MRI shows large left convexity meningioma with mass effect on the underlying left frontal and temporal lobes, along with minimal adjacent edema.     Clinical Impression   Pt admitted with the above diagnoses and presents with below problem list. Pt will benefit from continued acute OT to address the below listed deficits and maximize independence with basic ADLs prior to d/c home. PTA pt was independent with ADLs. Pt is currently setup to min A with ADLs. Cognition vs. communication deficits impacting current level of assist with ADLs. Spouse present during session     Follow Up Recommendations  No OT follow up    Equipment Recommendations  None recommended by OT    Recommendations for Other Services       Precautions / Restrictions Precautions Precautions: Fall      Mobility Bed Mobility               General bed mobility comments: up in chair  Transfers Overall transfer level: Needs assistance Equipment used: None Transfers: Sit to/from Stand Sit to Stand: Min guard         General transfer comment: min guard for stability, no physical assist required    Balance Overall balance assessment: Needs assistance         Standing balance support: No upper extremity supported Standing balance-Leahy Scale: Good Standing balance comment: stood to void at toilet, no swaying or LOB noted                            ADL Overall ADL's : Needs assistance/impaired Eating/Feeding: Set up;Sitting   Grooming: Supervision/safety;Minimal assistance;Standing Grooming Details (indicate cue type and reason): may have  difficult completing full grooming session without some assist Upper Body Bathing: Supervision/ safety;Sitting   Lower Body Bathing: Min guard   Upper Body Dressing : Supervision/safety;Set up;Minimal assistance   Lower Body Dressing: Supervision/safety;Minimal assistance;Min guard   Toilet Transfer: Min guard   Toileting- Clothing Manipulation and Hygiene: Min guard   Tub/ Shower Transfer: Walk-in shower;Min guard   Functional mobility during ADLs: Min guard General ADL Comments: Pt completed walk-in shower transfer and in-room mobility. Communication vs. cognitive deficits noted and may impact level of assist with ADLs.      Vision Additional Comments: visual fields appear to be intact, testing limited by cognitive vs communication deficits   Perception     Praxis      Pertinent Vitals/Pain Pain Assessment: No/denies pain     Hand Dominance     Extremity/Trunk Assessment Upper Extremity Assessment Upper Extremity Assessment: Overall WFL for tasks assessed   Lower Extremity Assessment Lower Extremity Assessment: Defer to PT evaluation       Communication Communication Communication: Expressive difficulties;Receptive difficulties (aphasia)   Cognition Arousal/Alertness: Awake/alert Behavior During Therapy: WFL for tasks assessed/performed;Impulsive Overall Cognitive Status: Difficult to assess                     General Comments       Exercises       Shoulder Instructions      Home Living Family/patient expects to be discharged to:: Private residence Living Arrangements: Spouse/significant other Available  Help at Discharge: Family;Available 24 hours/day Type of Home: House Home Access: Level entry     Home Layout: Two level;Able to live on main level with bedroom/bathroom     Bathroom Shower/Tub: Walk-in shower         Home Equipment: Shower seat - built in          Prior Functioning/Environment Level of Independence: Independent                  OT Problem List: Impaired balance (sitting and/or standing);Decreased cognition;Decreased knowledge of precautions;Decreased knowledge of use of DME or AE   OT Treatment/Interventions: Self-care/ADL training;DME and/or AE instruction;Therapeutic activities;Cognitive remediation/compensation;Patient/family education;Balance training    OT Goals(Current goals can be found in the care plan section) Acute Rehab OT Goals Patient Stated Goal: To go home OT Goal Formulation: With patient/family Time For Goal Achievement: 03/12/16 Potential to Achieve Goals: Good  OT Frequency: Min 2X/week   Barriers to D/C:            Co-evaluation              End of Session    Activity Tolerance: Patient tolerated treatment well Patient left: in bed;with call bell/phone within reach;with family/visitor present   Time: 0335-0350 OT Time Calculation (min): 15 min Charges:  OT General Charges $OT Visit: 1 Procedure OT Evaluation $OT Eval Low Complexity: 1 Procedure G-Codes:    Hortencia Pilar 2016-04-01, 5:07 PM

## 2016-03-06 MED ORDER — LEVETIRACETAM 500 MG PO TABS: 500.0000 mg | ORAL_TABLET | Freq: Two times a day (BID) | ORAL | 0 refills | Status: DC

## 2016-03-06 MED ORDER — HYDROCODONE-ACETAMINOPHEN 5-325 MG PO TABS: 1.0000 | ORAL_TABLET | ORAL | 0 refills | Status: DC | PRN

## 2016-03-06 MED ORDER — METHYLPREDNISOLONE 4 MG PO TBPK: ORAL_TABLET | ORAL | 0 refills | Status: DC

## 2016-03-06 NOTE — Discharge Summary (Signed)
Date of Admission: 03/02/2016  Date of Discharge: 03/06/16  PRE-OPERATIVE DIAGNOSIS:  Left frontotemporal extra-axial brain mass  POST-OPERATIVE DIAGNOSIS:  Same, was in section consistent with meningioma  PROCEDURE:  Left frontotemporal craniotomy for resection of meningioma utilizing frameless stereotactic guidance Attending: Eustace Moore, MD  Hospital Course:  The patient was admitted for the above listed operation and had an uncomplicated post-operative course.  They were discharged in stable condition. CN grossly intact. Able to move all extremities. Expressive aphasia.   Follow up: 1 week  Allergies as of 03/06/2016   No Known Allergies     Medication List    TAKE these medications   acetaminophen 500 MG tablet Commonly known as:  TYLENOL Take 1,000 mg by mouth every 6 (six) hours as needed (fever).   amLODipine 10 MG tablet Commonly known as:  NORVASC Take 10 mg by mouth daily.   aspirin EC 81 MG tablet Take 81 mg by mouth daily.   diazepam 5 MG tablet Commonly known as:  VALIUM Take 5 mg by mouth every 6 (six) hours as needed.   HYDROcodone-acetaminophen 5-325 MG tablet Commonly known as:  NORCO/VICODIN Take 1-2 tablets by mouth every 4 (four) hours as needed for moderate pain.   levETIRAcetam 500 MG tablet Commonly known as:  KEPPRA Take 1 tablet (500 mg total) by mouth 2 (two) times daily.   methylPREDNISolone 4 MG Tbpk tablet Commonly known as:  MEDROL Take according to package inserts.   omega-3 acid ethyl esters 1 g capsule Commonly known as:  LOVAZA Take 1 g by mouth daily.   ondansetron 4 MG disintegrating tablet Commonly known as:  ZOFRAN ODT Take 1 tablet (4 mg total) by mouth every 8 (eight) hours as needed for nausea.   vitamin B-12 100 MCG tablet Commonly known as:  CYANOCOBALAMIN Take 250 mcg by mouth daily.   vitamin C 500 MG tablet Commonly known as:  ASCORBIC ACID Take 500 mg by mouth daily.

## 2016-03-06 NOTE — Progress Notes (Signed)
Pt discharged from unit @ 12:30 Pm. IV and Tele monitoring discontinued. Instructions and prescriptions given. Pt and family member acknowledged understanding. Pt had no c/o of pain or distress upon leaving unit.

## 2016-03-07 LAB — TYPE AND SCREEN
BLOOD PRODUCT EXPIRATION DATE: 201803062359
BLOOD PRODUCT EXPIRATION DATE: 201803082359
BLOOD PRODUCT EXPIRATION DATE: 201803082359
Blood Product Expiration Date: 201803062359
ISSUE DATE / TIME: 201802091729
ISSUE DATE / TIME: 201802092225
UNIT TYPE AND RH: 7300
Unit Type and Rh: 5100
Unit Type and Rh: 5100
Unit Type and Rh: 7300

## 2016-03-10 ENCOUNTER — Ambulatory Visit: Payer: Commercial Managed Care - HMO | Attending: Physician Assistant

## 2016-03-10 DIAGNOSIS — R4701 Aphasia: Secondary | ICD-10-CM | POA: Insufficient documentation

## 2016-03-10 NOTE — Patient Instructions (Signed)
Tips for Talking with People who have Aphasia  . Say one thing at a time . Don't  rush - slow down, be patient . Talk face to face . Reduce background noise . Relax - be natural . Use pen and paper . Write down key words . Draw diagrams or pictures . Don't pretend you understand . Ask what helps . Recap - check you both understand . Be a partner, not a therapist   Aphasia does not affect intelligence, only language. The person with aphasia can still: make decisions, have opinions, and socialize.   Describing words  What group does it belong to?  What do I use it for?  Where can I find it?  What does it LOOK like?  What other words go with it?  What is the 1st sound of the word?     Many Ways to Communicate  Describe it Write it Draw it Gesture it Use related words   Apps: [employer], Heads up, Stop-fun categories, What if

## 2016-03-11 NOTE — Therapy (Signed)
[facility] [address] [place], [place], [address] Phone: [phone]   Fax:  [phone]  Speech Language Pathology Evaluation  Patient Details  Name: Kyle Chan MRN: [id number] Date of Birth: 1965-12-15 Referring Provider: Antony Contras, MD  Encounter Date: 03/10/2016      End of Session - 03/11/16 1710    Visit Number 1   Number of Visits 25   Date for SLP Re-Evaluation 05/20/16   SLP Start Time [id number]   SLP Stop Time  1400   SLP Time Calculation (min) 43 min      Past Medical History:  Diagnosis Date  . Hypertension     Past Surgical History:  Procedure Laterality Date  . CRANIOTOMY Left 03/03/2016   Procedure: LEFT CRANIOTOMY FOR BRAIN TUMOR;  Surgeon: Eustace Moore, MD;  Location: [facility];  Service: Neurosurgery;  Laterality: Left;  . NASAL RECONSTRUCTION WITH SEPTAL REPAIR N/A 11/18/2012   Procedure: NASAL RECONSTRUCTION WITH SEPTAL REPAIR;  Surgeon: Melida Quitter, MD;  Location: [facility];  Service: ENT;  Laterality: N/A;    There were no vitals filed for this visit.      Subjective Assessment - 03/10/16 1328    Subjective "My speeching is better now." "He's talking regular." (wife)   Patient is accompained by: --  wife   Currently in Pain? No/denies            SLP Evaluation [facility] - 03/11/16 0001      SLP Visit Information   SLP Received On 03/10/16   Referring Provider Antony Contras, MD   Medical Diagnosis CVA     Prior Functional Status   Type of Home House    Lives With Spouse   Vocation Full time employment  Drives truck for [employer]   Overall Cognitive Status Difficult to assess   Difficult to assess due to Impaired communication     Auditory Comprehension   Overall Auditory Comprehension Impaired   Paragraph Comprehension (via yes/no questions) 26-50% accurate  50% Mr. Sheppard Coil ?s   Other Yes/No Questions Comments` Other (comment)  Logical ?s  ([place]) 5/5   Other Conversation Comments Simple conversation in context appeared functionally understood, simple conversation out of context with extra time & repeats was near-functional   Interfering Components Processing speed   EffectiveTechniques Repetition;Extra processing time;Visual/Gestural cues     Reading Comprehension   Reading Status Impaired  1/4 words matching f:4 words to a picture     Expression   Primary Mode of Expression Verbal     Verbal Expression   Overall Verbal Expression Impaired   Automatic Speech Name;Social Response;Counting  perseverated on months when asked for days   Level of Generative/Spontaneous Verbalization Phrase   Repetition Impaired   Level of Impairment --  words >3 syllables   Naming Impairment   Responsive 76-100% accurate   Other Verbal Expression Comments Pt's simple conversation was functional with rare repeats requested. Pt awareness of errors was minimal.      Written Expression   Written Expression Exceptions to [facility]   Self Formulation Ability --  mis-spelled name but self corrected; mod A address     Oral Motor/Sensory Function   Overall Oral Motor/Sensory Function Appears within functional limits for tasks assessed  presence apraxia difficult to assess due to aphasia     Motor Speech   Overall Motor Speech Appears within functional limits for tasks assessed     Standardized Assessments  Standardized Assessments  [place] [facility] Apashia Examiniation-3rd  portions of this assessment                         SLP Education - 03/11/16 1710    Education provided Yes   Education Details aphasia tips, apps   Person(s) Educated Patient;Spouse   Methods Explanation;Handout;Verbal cues   Comprehension Verbalized understanding;Need further instruction  pt - needs instruction          SLP Short Term Goals - 03/11/16 1716      SLP SHORT TERM GOAL #1   Title pt will demo understanding of spoken  sentence-length material using superlatives, qualifiers (over, under, less, greater, etc), and prepositions with 85% and rare min A   Time 4   Period Weeks   Status New     SLP SHORT TERM GOAL #2   Title pt will complete cloze phrases/opposites with 95% success (functional responses) with self correction over three sessions   Time 4   Period Weeks   Status New     SLP SHORT TERM GOAL #3   Title pt will complete rote tasks (except counting) with 95% success with self correction   Time 4   Period Weeks   Status New     SLP SHORT TERM GOAL #4   Title pt will match word (f:4) to a picture 80% success   Time 4   Period Weeks   Status New     SLP SHORT TERM GOAL #5   Title pt will write simple functional phrases 80% with occasional min A   Time 4   Period Weeks   Status New          SLP Long Term Goals - 03/11/16 1721      SLP LONG TERM GOAL #1   Title pt will write 2-3 sentence notes/emails with modified independence   Time 12   Period Weeks   Status New     SLP LONG TERM GOAL #2   Title pt will demo understanding of functional 2-3 sentence paragraphs (emails, etc) with rare min A    Time 12   Period Weeks   Status New     SLP LONG TERM GOAL #3   Title pt will use multimodal communication PRN when verbal communication breaks down   Time 12   Period Weeks   Status New     SLP LONG TERM GOAL #4   Title pt will demo [facility] speech/language in 5 minutes min-mod complex conversation   Time 12   Period Weeks   Status New     SLP LONG TERM GOAL #5   Title pt will demo [facility] auditory comprehension with modified independnence in 5 minutes min-mod complex conversation   Time 12   Period Weeks   Status New          Plan - 03/10/16 1542    Clinical Impression Statement Pt presents with mod-severe receptive and expressive aphasia. Simple expression and reception in conversation with repetition has been relatively preserved. Pt also has deficits with reading comprehension  at the word level as well as with writing. I recommend 12 weeks of skilled ST to bring pt's language skills up to best approximate his PLOF.  An OT eval may be warranted after pt's receptive and expressive language improve.   Speech Therapy Frequency 2x / week   Duration --  12 weeks   Treatment/Interventions Language facilitation;Environmental controls;Multimodal communcation approach;Internal/external aids;SLP instruction  and feedback;Functional tasks   Potential to Achieve Goals Good   Potential Considerations Severity of impairments   Consulted and Agree with Plan of Care Patient      Patient will benefit from skilled therapeutic intervention in order to improve the following deficits and impairments:   Aphasia    Problem List Patient Active Problem List   Diagnosis Date Noted  . S/P craniotomy 03/03/2016  . Brain mass 03/02/2016    [facility] ,MS, CCC-SLP  03/11/2016, 5:27 PM  [facility] [address] [place], [place], [address] Phone: [phone]   Fax:  [phone]  Name: MARGARITA JAVED MRN: [id number] Date of Birth: April 27, 1965

## 2016-03-15 ENCOUNTER — Ambulatory Visit: Payer: Commercial Managed Care - HMO | Admitting: Speech Pathology

## 2016-03-15 DIAGNOSIS — R4701 Aphasia: Secondary | ICD-10-CM

## 2016-03-15 NOTE — Therapy (Signed)
[facility] [address] [place], [place], [address] Phone: [phone]   Fax:  [phone]  Speech Language Pathology Treatment  Patient Details  Name: Kyle Chan MRN: [id number] Date of Birth: 1965/11/22 Referring Provider: Antony Contras, MD  Encounter Date: 03/15/2016      End of Session - 03/15/16 1527    Visit Number 2   Number of Visits 25   Date for SLP Re-Evaluation 05/20/16   SLP Start Time 1232   SLP Stop Time  [id number]   SLP Time Calculation (min) 43 min      Past Medical History:  Diagnosis Date  . Hypertension     Past Surgical History:  Procedure Laterality Date  . CRANIOTOMY Left 03/03/2016   Procedure: LEFT CRANIOTOMY FOR BRAIN TUMOR;  Surgeon: Eustace Moore, MD;  Location: [facility];  Service: Neurosurgery;  Laterality: Left;  . NASAL RECONSTRUCTION WITH SEPTAL REPAIR N/A 11/18/2012   Procedure: NASAL RECONSTRUCTION WITH SEPTAL REPAIR;  Surgeon: Melida Quitter, MD;  Location: [facility];  Service: ENT;  Laterality: N/A;    There were no vitals filed for this visit.      Subjective Assessment - 03/15/16 1240    Subjective "I didn't practice at home, I was resting"               ADULT SLP TREATMENT - 03/15/16 1240      General Information   Behavior/Cognition Alert;Cooperative;Pleasant mood     Treatment Provided   Treatment provided Cognitive-Linquistic     Pain Assessment   Pain Assessment No/denies pain     Cognitive-Linquistic Treatment   Treatment focused on Aphasia;Apraxia   Skilled Treatment Automatic speech months, counting by 5's/10's, days of the week with occasional min to mod A. Facilitated reading comprehension at word leve, id word f:4-6 with 80% accuracy and occasional min A/extended time. Name basic objects with cloze phrase with usual min A. Written expression facilitated with biograhical information, family, pet names with occasional min A. High frequency 3-5 letter  words not personal to pt with consistent mod to max A.      Assessment / Recommendations / Plan   Plan Continue with current plan of care     Progression Toward Goals   Progression toward goals Progressing toward goals            SLP Short Term Goals - 03/15/16 1526      SLP SHORT TERM GOAL #1   Title pt will demo understanding of spoken sentence-length material using superlatives, qualifiers (over, under, less, greater, etc), and prepositions with 85% and rare min A   Time 4   Period Weeks   Status On-going     SLP SHORT TERM GOAL #2   Title pt will complete cloze phrases/opposites with 95% success (functional responses) with self correction over three sessions   Time 4   Period Weeks   Status On-going     SLP SHORT TERM GOAL #3   Title pt will complete rote tasks (except counting) with 95% success with self correction   Time 4   Period Weeks   Status On-going     SLP SHORT TERM GOAL #4   Title pt will match word (f:4) to a picture 80% success   Time 4   Period Weeks   Status On-going     SLP SHORT TERM GOAL #5   Title pt will write simple functional phrases 80% with occasional min A   Time  4   Period Weeks   Status On-going          SLP Long Term Goals - 03/15/16 1526      SLP LONG TERM GOAL #1   Title pt will write 2-3 sentence notes/emails with modified independence   Time 12   Period Weeks   Status On-going     SLP LONG TERM GOAL #2   Title pt will demo understanding of functional 2-3 sentence paragraphs (emails, etc) with rare min A    Time 12   Period Weeks   Status On-going     SLP LONG TERM GOAL #3   Title pt will use multimodal communication PRN when verbal communication breaks down   Time 12   Period Weeks   Status On-going     SLP LONG TERM GOAL #4   Title pt will demo [facility] speech/language in 5 minutes min-mod complex conversation   Time 12   Period Weeks   Status On-going     SLP LONG TERM GOAL #5   Title pt will demo [facility]  auditory comprehension with modified independnence in 5 minutes min-mod complex conversation   Time 12   Period Weeks   Status On-going          Plan - 03/15/16 1523    Clinical Impression Statement Pt required occasional min to mod A for reading comprehension and written expression at word level. Improvement noted when writing personal/family names. Simple naming with cloze cues required mod A. Continue skilled ST to maximize communication. Pt did report his dressing & grooming were "slower" now, agree to consider OT eval as aphasia resolves.    Speech Therapy Frequency 2x / week   Treatment/Interventions Language facilitation;Environmental controls;Multimodal communcation approach;Internal/external aids;SLP instruction and feedback;Functional tasks   Potential to Achieve Goals Good   Potential Considerations Severity of impairments   Consulted and Agree with Plan of Care Patient      Patient will benefit from skilled therapeutic intervention in order to improve the following deficits and impairments:   Aphasia    Problem List Patient Active Problem List   Diagnosis Date Noted  . S/P craniotomy 03/03/2016  . Brain mass 03/02/2016    Armandina Iman, Annye Rusk MS, CCC-SLP 03/15/2016, 3:27 PM  [facility] [address] [place], [place], [address] Phone: [phone]   Fax:  [phone]   Name: Kyle Chan MRN: [id number] Date of Birth: [birth year]

## 2016-03-17 ENCOUNTER — Inpatient Hospital Stay (HOSPITAL_COMMUNITY)
Admission: AD | Admit: 2016-03-17 | Discharge: 2016-03-24 | DRG: 908 | Disposition: A | Discharge status: Home / Self Care | Payer: Commercial Managed Care - HMO | Source: Ambulatory Visit | Attending: Neurological Surgery | Admitting: Neurological Surgery

## 2016-03-17 ENCOUNTER — Other Ambulatory Visit: Payer: Self-pay | Admitting: Neurological Surgery

## 2016-03-17 ENCOUNTER — Ambulatory Visit: Payer: Commercial Managed Care - HMO | Admitting: Speech Pathology

## 2016-03-17 ENCOUNTER — Encounter (HOSPITAL_COMMUNITY): Payer: Self-pay | Admitting: Student

## 2016-03-17 ENCOUNTER — Ambulatory Visit
Admission: RE | Admit: 2016-03-17 | Discharge: 2016-03-17 | Disposition: A | Discharge status: Home / Self Care | Payer: Commercial Managed Care - HMO | Source: Ambulatory Visit | Attending: Neurological Surgery | Admitting: Neurological Surgery

## 2016-03-17 DIAGNOSIS — Z9889 Other specified postprocedural states: Secondary | ICD-10-CM | POA: Diagnosis not present

## 2016-03-17 DIAGNOSIS — R4701 Aphasia: Secondary | ICD-10-CM | POA: Diagnosis present

## 2016-03-17 DIAGNOSIS — Y753 Surgical instruments, materials and neurological devices (including sutures) associated with adverse incidents: Secondary | ICD-10-CM | POA: Diagnosis present

## 2016-03-17 DIAGNOSIS — Z87891 Personal history of nicotine dependence: Secondary | ICD-10-CM | POA: Diagnosis not present

## 2016-03-17 DIAGNOSIS — R220 Localized swelling, mass and lump, head: Secondary | ICD-10-CM

## 2016-03-17 DIAGNOSIS — I10 Essential (primary) hypertension: Secondary | ICD-10-CM | POA: Diagnosis present

## 2016-03-17 DIAGNOSIS — Z86011 Personal history of benign neoplasm of the brain: Secondary | ICD-10-CM | POA: Diagnosis not present

## 2016-03-17 DIAGNOSIS — R5082 Postprocedural fever: Secondary | ICD-10-CM | POA: Diagnosis present

## 2016-03-17 DIAGNOSIS — T8579XA Infection and inflammatory reaction due to other internal prosthetic devices, implants and grafts, initial encounter: Principal | ICD-10-CM | POA: Diagnosis present

## 2016-03-17 DIAGNOSIS — R509 Fever, unspecified: Secondary | ICD-10-CM | POA: Diagnosis present

## 2016-03-17 LAB — CBC WITH DIFFERENTIAL/PLATELET
BASOS PCT: 0 %
Band Neutrophils: 1 %
Basophils Absolute: 0 10*3/uL (ref 0.0–0.1)
Blasts: 0 %
EOS PCT: 0 %
Eosinophils Absolute: 0 10*3/uL (ref 0.0–0.7)
HCT: 40.7 % (ref 39.0–52.0)
Hemoglobin: 13.8 g/dL (ref 13.0–17.0)
LYMPHS ABS: 1.4 10*3/uL (ref 0.7–4.0)
Lymphocytes Relative: 8 %
MCH: 30.8 pg (ref 26.0–34.0)
MCHC: 33.9 g/dL (ref 30.0–36.0)
MCV: 90.8 fL (ref 78.0–100.0)
METAMYELOCYTES PCT: 0 %
MONO ABS: 0.7 10*3/uL (ref 0.1–1.0)
MYELOCYTES: 1 %
Monocytes Relative: 4 %
NEUTROS PCT: 84 %
NRBC: 0 /100{WBCs}
Neutro Abs: 15.1 10*3/uL — ABNORMAL HIGH (ref 1.7–7.7)
Other: 2 %
PLATELETS: 220 10*3/uL (ref 150–400)
PROMYELOCYTES ABS: 0 %
RBC: 4.48 MIL/uL (ref 4.22–5.81)
RDW: 14.3 % (ref 11.5–15.5)
WBC: 17.5 10*3/uL — AB (ref 4.0–10.5)

## 2016-03-17 LAB — CSF CELL COUNT WITH DIFFERENTIAL
LYMPHS CSF: 8 % — AB (ref 40–80)
Monocyte-Macrophage-Spinal Fluid: 4 % — ABNORMAL LOW (ref 15–45)
RBC Count, CSF: 3450 /mm3 — ABNORMAL HIGH
SEGMENTED NEUTROPHILS-CSF: 88 % — AB (ref 0–6)
WBC CSF: 20900 /mm3 — AB (ref 0–5)

## 2016-03-17 LAB — PROTEIN AND GLUCOSE, CSF: Glucose, CSF: <20 mg/dL — CL (ref 40–70)

## 2016-03-17 LAB — BASIC METABOLIC PANEL
Anion gap: 11 (ref 5–15)
BUN: 8 mg/dL (ref 6–20)
CALCIUM: 9.3 mg/dL (ref 8.9–10.3)
CO2: 26 mmol/L (ref 22–32)
CREATININE: 1.07 mg/dL (ref 0.61–1.24)
Chloride: 103 mmol/L (ref 101–111)
GFR calc non Af Amer: >60 mL/min (ref >60)
Glucose, Bld: 163 mg/dL — ABNORMAL HIGH (ref 65–99)
Potassium: 3.7 mmol/L (ref 3.5–5.1)
SODIUM: 140 mmol/L (ref 135–145)

## 2016-03-17 MED ORDER — HYDROCODONE-ACETAMINOPHEN 5-325 MG PO TABS
1.0000 | ORAL_TABLET | ORAL | Status: DC | PRN
  Administered 2016-03-17: 2 via ORAL
  Administered 2016-03-18 (×2): 1 via ORAL
  Administered 2016-03-19: 2 via ORAL
  Administered 2016-03-19: 1 via ORAL
  Administered 2016-03-19 – 2016-03-20 (×4): 2 via ORAL
  Filled 2016-03-17 (×3): qty 2
  Filled 2016-03-17: qty 1
  Filled 2016-03-17 (×2): qty 2
  Filled 2016-03-17: qty 1
  Filled 2016-03-17: qty 2
  Filled 2016-03-17: qty 1

## 2016-03-17 MED ORDER — AMLODIPINE BESYLATE 10 MG PO TABS
10.0000 mg | ORAL_TABLET | Freq: Every day | ORAL | Status: DC
  Administered 2016-03-17 – 2016-03-24 (×8): 10 mg via ORAL
  Filled 2016-03-17 (×9): qty 1

## 2016-03-17 MED ORDER — DEXTROSE 5 % IV SOLN
2.0000 g | Freq: Two times a day (BID) | INTRAVENOUS | Status: DC
  Administered 2016-03-17 – 2016-03-24 (×14): 2 g via INTRAVENOUS
  Filled 2016-03-17 (×17): qty 2

## 2016-03-17 MED ORDER — ONDANSETRON HCL 4 MG/2ML IJ SOLN: 4.0000 mg | Freq: Four times a day (QID) | INTRAMUSCULAR | Status: DC | PRN

## 2016-03-17 MED ORDER — VANCOMYCIN HCL IN DEXTROSE 1-5 GM/200ML-% IV SOLN
1000.0000 mg | Freq: Two times a day (BID) | INTRAVENOUS | Status: DC
  Administered 2016-03-18 – 2016-03-19 (×4): 1000 mg via INTRAVENOUS
  Filled 2016-03-17 (×5): qty 200

## 2016-03-17 MED ORDER — BACLOFEN 10 MG PO TABS
10.0000 mg | ORAL_TABLET | Freq: Two times a day (BID) | ORAL | Status: DC | PRN
  Administered 2016-03-20 – 2016-03-23 (×6): 10 mg via ORAL
  Filled 2016-03-17 (×6): qty 1

## 2016-03-17 MED ORDER — POTASSIUM CHLORIDE IN NACL 20-0.9 MEQ/L-% IV SOLN
INTRAVENOUS | Status: DC
  Administered 2016-03-17: 1000 mL via INTRAVENOUS
  Administered 2016-03-18 – 2016-03-20 (×2): via INTRAVENOUS
  Filled 2016-03-17 (×5): qty 1000

## 2016-03-17 MED ORDER — CYANOCOBALAMIN 250 MCG PO TABS
250.0000 ug | ORAL_TABLET | Freq: Every day | ORAL | Status: DC
  Administered 2016-03-17 – 2016-03-20 (×4): 250 ug via ORAL
  Filled 2016-03-17: qty 3
  Filled 2016-03-17: qty 1
  Filled 2016-03-17 (×3): qty 3

## 2016-03-17 MED ORDER — ACETAMINOPHEN 650 MG RE SUPP: 650.0000 mg | Freq: Four times a day (QID) | RECTAL | Status: DC | PRN

## 2016-03-17 MED ORDER — LEVETIRACETAM 500 MG PO TABS
500.0000 mg | ORAL_TABLET | Freq: Two times a day (BID) | ORAL | Status: DC
  Administered 2016-03-17 – 2016-03-20 (×7): 500 mg via ORAL
  Filled 2016-03-17 (×7): qty 1

## 2016-03-17 MED ORDER — ACETAMINOPHEN 325 MG PO TABS
650.0000 mg | ORAL_TABLET | Freq: Four times a day (QID) | ORAL | Status: DC | PRN
  Administered 2016-03-20: 650 mg via ORAL
  Filled 2016-03-17: qty 2

## 2016-03-17 MED ORDER — VANCOMYCIN HCL 10 G IV SOLR
2500.0000 mg | Freq: Once | INTRAVENOUS | Status: AC
  Administered 2016-03-18: 2500 mg via INTRAVENOUS
  Filled 2016-03-17: qty 2500

## 2016-03-17 MED ORDER — ASPIRIN EC 81 MG PO TBEC
81.0000 mg | DELAYED_RELEASE_TABLET | Freq: Every day | ORAL | Status: DC
  Administered 2016-03-17 – 2016-03-24 (×7): 81 mg via ORAL
  Filled 2016-03-17 (×7): qty 1

## 2016-03-17 MED ORDER — ONDANSETRON HCL 4 MG PO TABS
4.0000 mg | ORAL_TABLET | Freq: Four times a day (QID) | ORAL | Status: DC | PRN
Start: 2016-03-17 — End: 2016-03-21

## 2016-03-17 NOTE — Progress Notes (Signed)
Pt arrived a little before shift change, alert and oriented no pain complaint, temp 99.7 on arrival and 100.9 at 2156 gave pt Vicodin 2 tabs 5-325. Critical value on CSF WBC 20900 called from lab and CSF sample had critical value of abnormal protein >600 and glucose < 20, paged called on call staff. CRITICAL VALUE ALERT  Critical value received:  2200  Date of notification:  03/17/16  Time of notification:  2200yes  Critical value read back: yes   Nurse who received alert: Azell Der  MD notified (1st page): 2223  Time of first page:  2223  MD notified (2nd page): N/A  Time of second page: N/A  Responding MD:  Dr. Frederich Cha  Time MD responded:  2229

## 2016-03-17 NOTE — Progress Notes (Signed)
Pharmacy Antibiotic Note  51 yo m presented to NS office with a fever s/p craniotomy 2/8 - surgical site is edematous from CSF  Plan: Ceftriaxone 2 g q12h Vancomycin 2500 mg x 1 then 1g q 12h Monitor renal fx, cx, vt prn  Weight: 239 lb 6.7 oz (108.6 kg)  Temp (24hrs), Avg:100.3 F (37.9 C), Min:99.7 F (37.6 C), Max:100.9 F (38.3 C)   Recent Labs Lab 03/17/16 1922  WBC 17.5*  CREATININE 1.07    Estimated Creatinine Clearance: 98.1 mL/min (by C-G formula based on SCr of 1.07 mg/dL).    No Known Allergies  Levester Fresh, PharmD, BCPS, BCCCP Clinical Pharmacist 03/17/2016 10:26 PM

## 2016-03-17 NOTE — Progress Notes (Signed)
Patient ID: Kyle Chan, male   DOB: [birth year], 51 y.o.   MRN: [id number]   The Patients wound aspiration shows 20,900 WBC, 88% PMN, 3450 RBC, Protein >600, glucose <20 consistent with infection. I'll start antibiotics.

## 2016-03-17 NOTE — H&P (Signed)
Kyle Chan is an 51 y.o. male.   HPI:  51 year old male comes in today as a direct admit. His recently had a craniotomy for meningioma. States that he has been febrile since yesterday. His temperature has been as high as 100.2. He was recently seen in the office earlier this week and was doing well with no complications from surgery. He is having some mild aphasia. His pain has been controlled at home but he states that he is now having tingling in the left side of his face and feels like it is swelling. Says that he has felt like he cant breath at times and it is difficult to put his dentures in to eat because it "feels tight." Denies any leaking of fluid from his incision site. Denies any neck pain or stiffness, or n/v.   Past Medical History:  Diagnosis Date  . Hypertension     Past Surgical History:  Procedure Laterality Date  . CRANIOTOMY Left 03/03/2016   Procedure: LEFT CRANIOTOMY FOR BRAIN TUMOR;  Surgeon: Kyle Moore, MD;  Location: [facility];  Service: Neurosurgery;  Laterality: Left;  . NASAL RECONSTRUCTION WITH SEPTAL REPAIR N/A 11/18/2012   Procedure: NASAL RECONSTRUCTION WITH SEPTAL REPAIR;  Surgeon: Kyle Quitter, MD;  Location: [facility];  Service: ENT;  Laterality: N/A;    No Known Allergies  Social History  Substance Use Topics  . Smoking status: Former Smoker    Types: Cigarettes  . Smokeless tobacco: Not on file  . Alcohol use Yes     Comment: occ    History reviewed. No pertinent family history.   Review of Systems   All other systems have been reviewed and were otherwise negative with the exception of those mentioned in the HPI and as above.  Objective: Vital signs in last 24 hours: Temp:  [99.7 F (37.6 C)] 99.7 F (37.6 C) (02/22 1633) Pulse Rate:  [84] 84 (02/22 1633) Resp:  [16] 16 (02/22 1633) BP: (151)/(76) 151/76 (02/22 1633) SpO2:  [99 %] 99 % (02/22 1633)  General Appearance: Alert, cooperative, no distress, appears stated age Head: edematous  at surgical site,  atraumatic Eyes: PERRL, conjunctiva/corneas clear, EOM's intact, fundi benign, both eyes      Throat: benign Neck: Supple, symmetrical, trachea midline, no adenopathy; no carotid bruit or JVD Back: Symmetric, no curvature, ROM normal Extremities: Extremities normal, atraumatic, no cyanosis or edema Pulses: 2+ and symmetric all extremities Skin: Skin color, texture, turgor normal, no rashes or lesions  NEUROLOGIC:   Mental status: A&O x4, mild aphasia at times, good attention span, Memory and fund of knowledge Motor Exam - grossly normal, normal tone and bulk Sensory Exam - grossly normal Reflexes: symmetric, no pathologic reflexes Coordination - grossly normal Gait - grossly normal Balance - grossly normal Cranial Nerves: I: smell Not tested  II: visual acuity  OS: normal OD: normal  II: visual fields Full to confrontation  II: pupils Equal, round, reactive to light  III,VII: ptosis None  III,IV,VI: extraocular muscles  Full ROM  V: mastication Normal  V: facial light touch sensation  Normal  V,VII: corneal reflex  Present  VII: facial muscle function - upper  Normal  VII: facial muscle function - lower Normal  VIII: hearing Not tested  IX: soft palate elevation  Normal  IX,X: gag reflex Present  XI: trapezius strength  5/5  XI: sternocleidomastoid strength 5/5  XI: neck flexion strength  5/5  XII: tongue strength  Normal  Data Review Lab Results  Component Value Date   WBC 12.7 (H) 03/02/2016   HGB 13.6 03/03/2016   HCT 40.0 03/03/2016   MCV 88.0 03/02/2016   PLT 225 03/02/2016   Lab Results  Component Value Date   NA 144 03/03/2016   K 3.7 03/03/2016   CL 108 03/02/2016   CO2 21 (L) 03/02/2016   BUN 12 03/02/2016   CREATININE 0.90 03/02/2016   GLUCOSE 125 (H) 03/02/2016   Lab Results  Component Value Date   INR 0.99 03/02/2016    Radiology: Ct Head Wo Contrast  Result Date: 03/17/2016 CLINICAL DATA:  Left facial swelling. These  results will be called to the ordering clinician or representative by the Radiology Department at the imaging location. EXAM: CT HEAD WITHOUT CONTRAST TECHNIQUE: Contiguous axial images were obtained from the base of the skull through the vertex without intravenous contrast. COMPARISON:  Brain MRI 03/04/2016 FINDINGS: Brain: Status post resection of left frontal convexity meningioma. Predominately fluid filled cavity is present in place of the tumor. Low-density regional frontal and superior temporal parenchyma correlating with cytotoxic edema on previous brain MRI. Swelling and midline shift is diminished, now 4 mm as opposed a 7 mm previously. No entrapment or acute infarct. No acute blood products. There is a fluid collection superficial to the craniotomy bone flap which measures 8 cm in diameter by 1.3 cm in thickness, likely CSF. The margins are enhancing and there is small volume gas; no unexpected soft tissue inflammation. Subcutaneous edema tracks from the surgical site into the left mid face. Vascular: No hyperdense vessel or unexpected calcification. Skull: Left frontal craniotomy with in place bone flap Sinuses/Orbits: Negative IMPRESSION: 1. Fluid collection superficial to the left frontal bone flap, favor leaked CSF. The collection measures up to 8 cm in diameter and 1.3 cm in thickness. 2. Unchanged intracranial findings of left frontal meningioma resection when compared to brain MRI 03/04/2016. Postoperative swelling and midline shift is improved. Electronically Signed   By: Kyle Chan M.D.   On: 03/17/2016 13:02     Assessment/Plan: Surgical incision does appear to be edematous likely from CSF. We will plan to aspirate some fluid from the site and send for culture. Continue to monitor patient overnight on the floor.    Kyle Chan [facility] 03/17/2016 5:12 PM

## 2016-03-18 ENCOUNTER — Encounter (HOSPITAL_COMMUNITY): Payer: Self-pay | Admitting: General Practice

## 2016-03-18 LAB — URINALYSIS, ROUTINE W REFLEX MICROSCOPIC
Bacteria, UA: NONE SEEN
Bilirubin Urine: NEGATIVE
GLUCOSE, UA: NEGATIVE mg/dL
HGB URINE DIPSTICK: NEGATIVE
KETONES UR: NEGATIVE mg/dL
NITRITE: NEGATIVE
PH: 6 (ref 5.0–8.0)
PROTEIN: NEGATIVE mg/dL
Specific Gravity, Urine: 1.017 (ref 1.005–1.030)

## 2016-03-18 LAB — HIV ANTIBODY (ROUTINE TESTING W REFLEX): HIV Screen 4th Generation wRfx: NONREACTIVE

## 2016-03-18 NOTE — Progress Notes (Signed)
Patient ID: Kyle Chan, male   DOB: 09-Jan-1966, 51 y.o.   MRN: [id number] Subjective: Patient reports no headache  Objective: Vital signs in last 24 hours: Temp:  [98.4 F (36.9 C)-100.9 F (38.3 C)] 98.4 F (36.9 C) (02/23 0553) Pulse Rate:  [68-113] 68 (02/23 0553) Resp:  [16-18] 16 (02/23 0553) BP: (126-135)/(71-81) 126/71 (02/23 0553) SpO2:  [97 %-99 %] 99 % (02/23 0553) Weight:  [108.1 kg (238 lb 5.1 oz)-108.6 kg (239 lb 6.7 oz)] 108.1 kg (238 lb 5.1 oz) (02/23 0200)  Intake/Output from previous day: 02/22 0701 - 02/23 0700 In: 915 [I.V.:365; IV Piggyback:550] Out: 850 [Urine:850] Intake/Output this shift: No intake/output data recorded.  Neurologic: Grossly normal  Lab Results: Lab Results  Component Value Date   WBC 17.5 (H) 03/17/2016   HGB 13.8 03/17/2016   HCT 40.7 03/17/2016   MCV 90.8 03/17/2016   PLT 220 03/17/2016   Lab Results  Component Value Date   INR 0.99 03/02/2016   BMET Lab Results  Component Value Date   NA 140 03/17/2016   K 3.7 03/17/2016   CL 103 03/17/2016   CO2 26 03/17/2016   GLUCOSE 163 (H) 03/17/2016   BUN 8 03/17/2016   CREATININE 1.07 03/17/2016   CALCIUM 9.3 03/17/2016    Studies/Results: Ct Head Wo Contrast  Result Date: 03/17/2016 CLINICAL DATA:  Left facial swelling. These results will be called to the ordering clinician or representative by the Radiology Department at the imaging location. EXAM: CT HEAD WITHOUT CONTRAST TECHNIQUE: Contiguous axial images were obtained from the base of the skull through the vertex without intravenous contrast. COMPARISON:  Brain MRI 03/04/2016 FINDINGS: Brain: Status post resection of left frontal convexity meningioma. Predominately fluid filled cavity is present in place of the tumor. Low-density regional frontal and superior temporal parenchyma correlating with cytotoxic edema on previous brain MRI. Swelling and midline shift is diminished, now 4 mm as opposed a 7 mm previously. No  entrapment or acute infarct. No acute blood products. There is a fluid collection superficial to the craniotomy bone flap which measures 8 cm in diameter by 1.3 cm in thickness, likely CSF. The margins are enhancing and there is small volume gas; no unexpected soft tissue inflammation. Subcutaneous edema tracks from the surgical site into the left mid face. Vascular: No hyperdense vessel or unexpected calcification. Skull: Left frontal craniotomy with in place bone flap Sinuses/Orbits: Negative IMPRESSION: 1. Fluid collection superficial to the left frontal bone flap, favor leaked CSF. The collection measures up to 8 cm in diameter and 1.3 cm in thickness. 2. Unchanged intracranial findings of left frontal meningioma resection when compared to brain MRI 03/04/2016. Postoperative swelling and midline shift is improved. Electronically Signed   By: Monte Fantasia M.D.   On: 03/17/2016 13:02    Assessment/Plan: Looks great clinically. I would presume he'd feel bad (almost like meningitis) if head a significant infection. Cxs neg thus far. Continue empiric abx.   LOS: 1 day    Kinzi Frediani S 03/18/2016, 6:09 PM

## 2016-03-18 NOTE — Care Management Note (Signed)
Case Management Note  Patient Details  Name: Kyle Chan MRN: [id number] Date of Birth: 04/01/1965  Subjective/Objective:    Pt admitted with fever and wound infection. He is from home with his wife.                 Action/Plan: Plan is for home when medically stable. CM following for discharge needs and physician orders.  Expected Discharge Date:                  Expected Discharge Plan:     In-House Referral:     Discharge planning Services     Post Acute Care Choice:    Choice offered to:     DME Arranged:    DME Agency:     HH Arranged:    HH Agency:     Status of Service:  In process, will continue to follow  If discussed at Long Length of Stay Meetings, dates discussed:    Additional Comments:  Pollie Friar, RN 03/18/2016, 1:03 PM

## 2016-03-19 ENCOUNTER — Encounter (HOSPITAL_COMMUNITY): Payer: Self-pay | Admitting: General Practice

## 2016-03-19 LAB — BODY FLUID CELL COUNT WITH DIFFERENTIAL
Eos, Fluid: 1 %
LYMPHS FL: 8 %
MONOCYTE-MACROPHAGE-SEROUS FLUID: 1 % — AB (ref 50–90)
NEUTROPHIL FLUID: 90 % — AB (ref 0–25)
WBC FLUID: 19250 uL — AB (ref 0–1000)

## 2016-03-19 LAB — BASIC METABOLIC PANEL
Anion gap: 11 (ref 5–15)
BUN: 7 mg/dL (ref 6–20)
CHLORIDE: 106 mmol/L (ref 101–111)
CO2: 22 mmol/L (ref 22–32)
Calcium: 8.6 mg/dL — ABNORMAL LOW (ref 8.9–10.3)
Creatinine, Ser: 0.83 mg/dL (ref 0.61–1.24)
GFR calc non Af Amer: >60 mL/min (ref >60)
Glucose, Bld: 139 mg/dL — ABNORMAL HIGH (ref 65–99)
POTASSIUM: 3.5 mmol/L (ref 3.5–5.1)
SODIUM: 139 mmol/L (ref 135–145)

## 2016-03-19 LAB — CBC
HEMATOCRIT: 34.9 % — AB (ref 39.0–52.0)
Hemoglobin: 11.9 g/dL — ABNORMAL LOW (ref 13.0–17.0)
MCH: 30.9 pg (ref 26.0–34.0)
MCHC: 34.1 g/dL (ref 30.0–36.0)
MCV: 90.6 fL (ref 78.0–100.0)
PLATELETS: 182 10*3/uL (ref 150–400)
RBC: 3.85 MIL/uL — AB (ref 4.22–5.81)
RDW: 14.2 % (ref 11.5–15.5)
WBC: 11.3 10*3/uL — AB (ref 4.0–10.5)

## 2016-03-19 MED ORDER — SODIUM CHLORIDE 0.9% FLUSH
10.0000 mL | INTRAVENOUS | Status: DC | PRN
  Administered 2016-03-19 – 2016-03-20 (×3): 10 mL
  Filled 2016-03-19 (×3): qty 40

## 2016-03-19 NOTE — Progress Notes (Signed)
Peripherally Inserted Central Catheter/Midline Placement  The IV Nurse has discussed with the patient and/or persons authorized to consent for the patient, the purpose of this procedure and the potential benefits and risks involved with this procedure.  The benefits include less needle sticks, lab draws from the catheter, and the patient may be discharged home with the catheter. Risks include, but not limited to, infection, bleeding, blood clot (thrombus formation), and puncture of an artery; nerve damage and irregular heartbeat and possibility to perform a PICC exchange if needed/ordered by physician.  Alternatives to this procedure were also discussed.  Bard Power PICC patient education guide, fact sheet on infection prevention and patient information card has been provided to patient /or left at bedside.    PICC/Midline Placement Documentation    Wife signed consent at bedside per pt requst    Synthia Innocent 03/19/2016, 8:39 AM

## 2016-03-19 NOTE — Progress Notes (Addendum)
14:30 Spoke with answering service [phone]  to request callback for clarification of antibiotics that will be needed at DC, and to request [facility] RN order.  15:15 No call back received. Placed sticky note requesting outpatient parenteral antibiotic therapy (OPAT) 15:52 Spoke with answering service [phone]  to request callback for clarification of antibiotics that will be needed at DC, and to request [facility] RN order.  15:57 Received callback from [place], MD in surgery will address when available.

## 2016-03-19 NOTE — Progress Notes (Signed)
Patient ID: Kyle Chan, male   DOB: 07/13/1965, 51 y.o.   MRN: [id number] Subjective: Patient reports some headache at times  Objective: Vital signs in last 24 hours: Temp:  [98.6 F (37 C)-100 F (37.8 C)] 100 F (37.8 C) (02/24 0500) Pulse Rate:  [69-86] 78 (02/24 0500) Resp:  [16-18] 18 (02/24 0500) BP: (127-142)/(72-82) 142/81 (02/24 0500) SpO2:  [96 %-99 %] 98 % (02/24 0500)  Intake/Output from previous day: 02/23 0701 - 02/24 0700 In: 2405 [I.V.:1905; IV Piggyback:500] Out: 900 [Urine:900] Intake/Output this shift: No intake/output data recorded.  Neurologic: Grossly normal , incision and clean dry and intact the fluid has reaccumulated.  Lab Results: Lab Results  Component Value Date   WBC 11.3 (H) 03/19/2016   HGB 11.9 (L) 03/19/2016   HCT 34.9 (L) 03/19/2016   MCV 90.6 03/19/2016   PLT 182 03/19/2016   Lab Results  Component Value Date   INR 0.99 03/02/2016   BMET Lab Results  Component Value Date   NA 139 03/19/2016   K 3.5 03/19/2016   CL 106 03/19/2016   CO2 22 03/19/2016   GLUCOSE 139 (H) 03/19/2016   BUN 7 03/19/2016   CREATININE 0.83 03/19/2016   CALCIUM 8.6 (L) 03/19/2016    Studies/Results: Ct Head Wo Contrast  Result Date: 03/17/2016 CLINICAL DATA:  Left facial swelling. These results will be called to the ordering clinician or representative by the Radiology Department at the imaging location. EXAM: CT HEAD WITHOUT CONTRAST TECHNIQUE: Contiguous axial images were obtained from the base of the skull through the vertex without intravenous contrast. COMPARISON:  Brain MRI 03/04/2016 FINDINGS: Brain: Status post resection of left frontal convexity meningioma. Predominately fluid filled cavity is present in place of the tumor. Low-density regional frontal and superior temporal parenchyma correlating with cytotoxic edema on previous brain MRI. Swelling and midline shift is diminished, now 4 mm as opposed a 7 mm previously. No entrapment or acute  infarct. No acute blood products. There is a fluid collection superficial to the craniotomy bone flap which measures 8 cm in diameter by 1.3 cm in thickness, likely CSF. The margins are enhancing and there is small volume gas; no unexpected soft tissue inflammation. Subcutaneous edema tracks from the surgical site into the left mid face. Vascular: No hyperdense vessel or unexpected calcification. Skull: Left frontal craniotomy with in place bone flap Sinuses/Orbits: Negative IMPRESSION: 1. Fluid collection superficial to the left frontal bone flap, favor leaked CSF. The collection measures up to 8 cm in diameter and 1.3 cm in thickness. 2. Unchanged intracranial findings of left frontal meningioma resection when compared to brain MRI 03/04/2016. Postoperative swelling and midline shift is improved. Electronically Signed   By: Monte Fantasia M.D.   On: 03/17/2016 13:02    Assessment/Plan: I withdrew about 40 mL of fluid once again and sent for studies. I will treat this like an infection. Cultures are negative to date. Continue antibiotics. We'll receive PICC line for suspected home antibiotics.   LOS: 2 days    Kyle Chan S 03/19/2016, 8:36 AM

## 2016-03-20 ENCOUNTER — Inpatient Hospital Stay (HOSPITAL_COMMUNITY): Payer: Commercial Managed Care - HMO

## 2016-03-20 DIAGNOSIS — Z9889 Other specified postprocedural states: Secondary | ICD-10-CM

## 2016-03-20 LAB — PROTEIN AND GLUCOSE, CSF
Glucose, CSF: <20 mg/dL — CL (ref 40–70)
Total  Protein, CSF: >600 mg/dL — ABNORMAL HIGH (ref 15–45)

## 2016-03-20 LAB — CBC
HCT: 36 % — ABNORMAL LOW (ref 39.0–52.0)
HCT: 36.7 % — ABNORMAL LOW (ref 39.0–52.0)
HEMOGLOBIN: 12.5 g/dL — AB (ref 13.0–17.0)
Hemoglobin: 12.2 g/dL — ABNORMAL LOW (ref 13.0–17.0)
MCH: 30.5 pg (ref 26.0–34.0)
MCH: 30.7 pg (ref 26.0–34.0)
MCHC: 33.9 g/dL (ref 30.0–36.0)
MCHC: 34.1 g/dL (ref 30.0–36.0)
MCV: 90 fL (ref 78.0–100.0)
MCV: 90.2 fL (ref 78.0–100.0)
Platelets: 193 10*3/uL (ref 150–400)
Platelets: 193 10*3/uL (ref 150–400)
RBC: 4 MIL/uL — ABNORMAL LOW (ref 4.22–5.81)
RBC: 4.07 MIL/uL — AB (ref 4.22–5.81)
RDW: 13.9 % (ref 11.5–15.5)
RDW: 13.9 % (ref 11.5–15.5)
WBC: 11.9 10*3/uL — ABNORMAL HIGH (ref 4.0–10.5)
WBC: 9.5 10*3/uL (ref 4.0–10.5)

## 2016-03-20 LAB — BODY FLUID CELL COUNT WITH DIFFERENTIAL
EOS FL: 0 %
LYMPHS FL: 1 %
MONOCYTE-MACROPHAGE-SEROUS FLUID: 0 % — AB (ref 50–90)
NEUTROPHIL FLUID: 99 % — AB (ref 0–25)
WBC FLUID: 71250 uL — AB (ref 0–1000)

## 2016-03-20 LAB — BASIC METABOLIC PANEL
ANION GAP: 8 (ref 5–15)
BUN: 6 mg/dL (ref 6–20)
CHLORIDE: 107 mmol/L (ref 101–111)
CO2: 24 mmol/L (ref 22–32)
Calcium: 8.7 mg/dL — ABNORMAL LOW (ref 8.9–10.3)
Creatinine, Ser: 0.89 mg/dL (ref 0.61–1.24)
GFR calc Af Amer: >60 mL/min (ref >60)
GFR calc non Af Amer: >60 mL/min (ref >60)
GLUCOSE: 136 mg/dL — AB (ref 65–99)
POTASSIUM: 3.4 mmol/L — AB (ref 3.5–5.1)
Sodium: 139 mmol/L (ref 135–145)

## 2016-03-20 LAB — MISC LABCORP TEST (SEND OUT): Labcorp test code: 19588

## 2016-03-20 LAB — VANCOMYCIN, TROUGH: Vancomycin Tr: 7 ug/mL — ABNORMAL LOW (ref 15–20)

## 2016-03-20 LAB — SEDIMENTATION RATE: SED RATE: 62 mm/h — AB (ref 0–16)

## 2016-03-20 LAB — C-REACTIVE PROTEIN: CRP: 5.1 mg/dL — ABNORMAL HIGH (ref <1.0)

## 2016-03-20 MED ORDER — IBUPROFEN 200 MG PO TABS
600.0000 mg | ORAL_TABLET | Freq: Four times a day (QID) | ORAL | Status: DC | PRN
  Administered 2016-03-20: 600 mg via ORAL
  Filled 2016-03-20: qty 3

## 2016-03-20 MED ORDER — DEXAMETHASONE SODIUM PHOSPHATE 10 MG/ML IJ SOLN
10.0000 mg | Freq: Once | INTRAMUSCULAR | Status: AC
  Administered 2016-03-20: 10 mg via INTRAVENOUS

## 2016-03-20 MED ORDER — DEXAMETHASONE SODIUM PHOSPHATE 4 MG/ML IJ SOLN
4.0000 mg | Freq: Four times a day (QID) | INTRAMUSCULAR | Status: DC
  Administered 2016-03-20 – 2016-03-21 (×3): 4 mg via INTRAVENOUS
  Filled 2016-03-20 (×3): qty 1

## 2016-03-20 MED ORDER — VANCOMYCIN HCL IN DEXTROSE 1-5 GM/200ML-% IV SOLN
1000.0000 mg | Freq: Three times a day (TID) | INTRAVENOUS | Status: AC
  Administered 2016-03-20 – 2016-03-22 (×8): 1000 mg via INTRAVENOUS
  Filled 2016-03-20 (×10): qty 200

## 2016-03-20 MED ORDER — IOPAMIDOL (ISOVUE-300) INJECTION 61%
INTRAVENOUS | Status: AC
  Administered 2016-03-20: 75 mL
  Filled 2016-03-20: qty 75

## 2016-03-20 NOTE — Progress Notes (Addendum)
Received call from nursing Pt's temp is 103.3 oral despite having acetaminophen 1 hour prior  We are still pending blood cultures  He is currently on Vanc and Rocephin Discussed with Dr Cyndy Freeze Admin Ibuprofen 600 q 8 Obtain CBC, CXR and b/l LE U/S

## 2016-03-20 NOTE — Progress Notes (Signed)
Patient ID: Kyle Chan, male   DOB: [birth year], 51 y.o.   MRN: [id number] [name] with wife at length this am. Fever noted. She notes cough, sometimes productive, some chills. Otherwise doing well.   Difficult situation. His fever, peripheral leukocytosis (improved), and csf leukocytosis certainly favor infection. However, his wound is pristine, he is non tender, no evidence of meningismus or headache or lethargy or other signs of signifiant cranial infection, and his cultures are negative at 3 days.   He remains on empiric IV abx despite negative cultures.  DDX: 1. Infection, 2. Sterile Inflammatory reaction, possibly to the dural graft  Options: 1. Continued observation with abx, 2. Cranial exploration with washout, culture, and drain placement with or without removal of the dural graft and / or bone flap.  Pt and wife do NOT want further surgery unless absolutely necessary/ indicated. I'm not convinced we're at the "absolutely" stage at this point since cultures are negative and he's neurologically stable/ non toxic.  Plan: CT head with and without, continue abx, resend CSF studies, continued observation but pt/ wife understands that he may still require surgical treatment

## 2016-03-20 NOTE — Progress Notes (Signed)
Patient given incentive spirometer and goal set at 2000. Patient correctly demonstrates use of spirometer. Informed to attempt 10 x per hour. Will continue to monitor temperature. Wendee Copp

## 2016-03-20 NOTE — Progress Notes (Signed)
Patient ID: Kyle Chan, male   DOB: [birth year], 51 y.o.   MRN: [id number] Patient's labs reviewed. Patient seen and examined. He still looks quite good. There is some reaccumulation of fluid under the skin. I drew off another 20 mL of turbid fluid. The white count has more than triple. Gram stain still negative. Cultures still negative. On IV antibiotics.  I have explained to him that despite our best efforts to treat this nonsurgically, which has been our hope and his wishes, I feel like the numbers are going the wrong direction other than his peripheral white count. I feel like I should explore the wound and wash it out, probably remove his bone flap since it is devascularized, and place a subgaleal drain and probably place vancomycin powder. I have described this to him in detail. He understands the risk include but are not limited to bleeding, infection, injury, stroke, loss of speech, numbness, weakness, lack of relief of symptoms, worsening symptoms, need for further surgery, and anesthesia risk including but not limited to DVT pneumonia MI and death. He agrees to proceed. I do not see any reason to do this urgently tonight and therefore we will make him nothing by mouth and do this first thing in the morning when there is an available operating room.

## 2016-03-20 NOTE — Progress Notes (Signed)
Began infusion teaching with patient's wife.  Discussed aseptic technique of PICC care, flushing protocols before and after antibiotic infusion and expectations of home care.  Spouse is aware home health will teach her how to infuse antibiotics at home.  She is agreeable. Will continue education in the hopes of decreasing spouse anxiety about infusion management. Wendee Copp

## 2016-03-20 NOTE — Progress Notes (Signed)
Pharmacy Antibiotic Note Kyle Chan is a 51 y.o. male admitted on 03/17/2016 with fever and concern for surgical site infection in setting of recent craniotomy for meningioma. Currently on day 3 of ceftriaxone and vancomycin for treatment.   Vancomycin trough of 7 is below desired range of 15-20 based on indication. SCr remains stable. WBC improved but remains febrile.  Plan: 1. Adjust vancomycin dose to 1 gram IV every 8 hours  2. Continue Ceftriaxone 2 grams IV every 12 hours  3. Follow up culture data   Height: 5\' 9"  (175.3 cm) Weight: 238 lb 5.1 oz (108.1 kg) IBW/kg (Calculated) : 70.7  Temp (24hrs), Avg:100.2 F (37.9 C), Min:98.4 F (36.9 C), Max:103.3 F (39.6 C)   Recent Labs Lab 03/17/16 1922 03/19/16 0244 03/20/16 0446 03/20/16 1022  WBC 17.5* 11.3* 11.9*  --   CREATININE 1.07 0.83  --  0.89  VANCOTROUGH  --   --   --  7*    Estimated Creatinine Clearance: 117.7 mL/min (by C-G formula based on SCr of 0.89 mg/dL).    No Known Allergies  Antimicrobials this admission: Vancomycin 2/22>> Ceftriaxone 2/22>>  Microbiology results: 2/22 CSF: ngtd 2/22 BCx: ngtd 2/24 cranial abscess: px 2/24: BCx: ngtd  Thank you for allowing pharmacy to be a part of this patient's care.  Vincenza Hews, PharmD, BCPS 03/20/2016, 1:05 PM

## 2016-03-20 NOTE — Progress Notes (Signed)
Patient ID: Kyle Chan, male   DOB: 04-Apr-1965, 51 y.o.   MRN: [id number] Pt seen and examined. He looks good and feels good, no headache. He is awake and conversant.  I withdrew 30 cc of rust colored fluid from the scalp area and I will send it for studies  cxs to date all negative and no organisms on smears, and the first cultures were sent prior to antibiotics. I am not covering fungi at present  CT reviewed - mild peripheral enhancement is expected this soon after surgery, so this doesn't really help with surgical decision making  Sed rate and crp are considered, but are non specific and non diagnostic  I have spoken to 2 of my partners independently and both feel they'd continue to observe this on abx for now, as I have been doing. The surgical exploration may would allow for irrigation, and removal of the graft, but this also carries inherent risks that may or may not be necessary if this is a sterile inflammatory reaction. Both suggest a trial of decadron.  I have spoken to the pt/ wife at length and explained all of this decision making and they understand are pleased with the plan.

## 2016-03-20 NOTE — Progress Notes (Signed)
*  PRELIMINARY RESULTS* Vascular Ultrasound Bilateral lower extremity venous duplex has been completed.  Preliminary findings: No evidence of deep vein thrombosis or baker's cysts bilaterally.   Everrett Coombe 03/20/2016, 12:47 PM

## 2016-03-20 NOTE — Progress Notes (Signed)
[facility] [facility] infusion coordinator will follow pt for home infusion pharmacy services for home IV ABX as ordered at DC.  [facility] will partner with patient's [facility] agency of choice.   If patient discharges after hours, please call [phone].   Larry Sierras 03/20/2016, 9:16 PM

## 2016-03-20 NOTE — Progress Notes (Signed)
Wife called RN to room stating that pt. having difficulty breathing- pt. Resting in bed, and no distress noted; O2 sat 97% RA; placed pt. On 1L O2 for comfort; chest clear, but sounds little nasally congested.; pt. somewhat quiet and wife answering questions for him. Temp. rechecked and 103.3; Tylenol given earlier at 0141 for temp. 101.; wife states pt.'s been coughing off/on all day and asked if she told anyone and stated no; pt. coughed up sm. amt. clear mucus in cup. Paged Mariana Single, PA for Dr. Ronnald Ramp at [phone] and RTC at 0241 and info. Given; he will call Dr. Cyndy Freeze. [place], PA called back and orders given- will let pt. and wife know.

## 2016-03-20 NOTE — Progress Notes (Signed)
Patient requesting to take a shower. Discussed shower protocol post-PICC placement with IV team to confirm that this is not possible as there is no way to completely cover PICC site and avoid damage to dressing/infection risk.  Given clean linen and bath accessories. Will continue bedside bath during this admission. Wendee Copp

## 2016-03-20 NOTE — Progress Notes (Addendum)
Pt seen and examined. Temp reached 103.3 oral roughly 0200 Was given ibuprofen which has improved temp to 100.3 at 0500 Denies any concerns with the exception of cause of fever Denies bowel or bladder dysfunction, slurring speech, facial droop.  EXAM: Temp:  [98.8 F (37.1 C)-103.3 F (39.6 C)] 100.3 F (37.9 C) (02/25 0504) Pulse Rate:  [69-85] 75 (02/25 0504) Resp:  [16-20] 16 (02/25 0504) BP: (133-150)/(68-84) 140/68 (02/25 0504) SpO2:  [97 %-100 %] 97 % (02/25 0504) Intake/Output      02/24 0701 - 02/25 0700   I.V. (mL/kg) 10 (0.1)   Total Intake(mL/kg) 10 (0.1)   Urine (mL/kg/hr) 725 (0.3)   Total Output 725   Net -715        Awake and alert Follows commands throughout Full strength CN grossly intact   Plan Temperature has improved with Ibuprofen. Will continue Still pending wound cultures. Continue Vanc and Rocephin until cultures CBC, CXR and b/l lower extremity U/S ordered. Pending results.  Continue current management thus far   Addendum CXR - no pneumonia CBC - slight leukocytosis - for the most part appears stable

## 2016-03-20 NOTE — Progress Notes (Signed)
CRITICAL VALUE ALERT  Critical value received:  CSF Glucose <20  Date of notification: 03/20/16   Time of notification1515  Critical value read back:yes Nurse who received alert: Melanee Left, RN  MD notified (1st page): Dr. Ronnald Ramp  Time of first page: 1520  MD notified (2nd page):  Time of second page:  Responding MD:  Dr. Ronnald Ramp  Time MD responded:1521

## 2016-03-21 ENCOUNTER — Inpatient Hospital Stay (HOSPITAL_COMMUNITY): Payer: Commercial Managed Care - HMO | Admitting: Anesthesiology

## 2016-03-21 ENCOUNTER — Encounter (HOSPITAL_COMMUNITY)
Admission: AD | Disposition: A | Discharge status: Home / Self Care | Payer: Self-pay | Source: Ambulatory Visit | Attending: Neurological Surgery

## 2016-03-21 HISTORY — PX: CRANIOTOMY: SHX93

## 2016-03-21 LAB — BASIC METABOLIC PANEL
ANION GAP: 9 (ref 5–15)
BUN: 8 mg/dL (ref 6–20)
CALCIUM: 9.4 mg/dL (ref 8.9–10.3)
CO2: 24 mmol/L (ref 22–32)
CREATININE: 0.82 mg/dL (ref 0.61–1.24)
Chloride: 106 mmol/L (ref 101–111)
GFR calc non Af Amer: >60 mL/min (ref >60)
Glucose, Bld: 176 mg/dL — ABNORMAL HIGH (ref 65–99)
Potassium: 4.2 mmol/L (ref 3.5–5.1)
SODIUM: 139 mmol/L (ref 135–145)

## 2016-03-21 LAB — MRSA PCR SCREENING: MRSA BY PCR: NEGATIVE

## 2016-03-21 LAB — AEROBIC CULTURE  (SUPERFICIAL SPECIMEN)

## 2016-03-21 LAB — GRAM STAIN

## 2016-03-21 LAB — AEROBIC CULTURE W GRAM STAIN (SUPERFICIAL SPECIMEN): Culture: NO GROWTH

## 2016-03-21 LAB — CSF CULTURE W GRAM STAIN: Culture: NO GROWTH

## 2016-03-21 LAB — CSF CULTURE

## 2016-03-21 SURGERY — CRANIOTOMY, FOR ABSCESS DRAINAGE
Anesthesia: General | Site: Head | Laterality: Left

## 2016-03-21 MED ORDER — SODIUM CHLORIDE 0.9 % IV SOLN
INTRAVENOUS | Status: DC | PRN
  Administered 2016-03-21 (×2): via INTRAVENOUS

## 2016-03-21 MED ORDER — PROPOFOL 10 MG/ML IV BOLUS
INTRAVENOUS | Status: DC | PRN
  Administered 2016-03-21: 120 mg via INTRAVENOUS
  Administered 2016-03-21: 50 mg via INTRAVENOUS
  Administered 2016-03-21: 30 mg via INTRAVENOUS

## 2016-03-21 MED ORDER — FENTANYL CITRATE (PF) 100 MCG/2ML IJ SOLN
INTRAMUSCULAR | Status: AC
  Filled 2016-03-21: qty 2

## 2016-03-21 MED ORDER — FENTANYL CITRATE (PF) 100 MCG/2ML IJ SOLN
INTRAMUSCULAR | Status: DC | PRN
  Administered 2016-03-21: 50 ug via INTRAVENOUS
  Administered 2016-03-21: 100 ug via INTRAVENOUS
  Administered 2016-03-21 (×3): 50 ug via INTRAVENOUS

## 2016-03-21 MED ORDER — VANCOMYCIN HCL 1000 MG IV SOLR
INTRAVENOUS | Status: AC
  Filled 2016-03-21: qty 1000

## 2016-03-21 MED ORDER — HYDROMORPHONE HCL 1 MG/ML IJ SOLN: 0.2500 mg | INTRAMUSCULAR | Status: DC | PRN

## 2016-03-21 MED ORDER — LEVETIRACETAM 500 MG PO TABS
500.0000 mg | ORAL_TABLET | Freq: Two times a day (BID) | ORAL | Status: DC
  Administered 2016-03-21 – 2016-03-24 (×7): 500 mg via ORAL
  Filled 2016-03-21 (×7): qty 1

## 2016-03-21 MED ORDER — ROCURONIUM BROMIDE 50 MG/5ML IV SOSY
PREFILLED_SYRINGE | INTRAVENOUS | Status: AC
  Filled 2016-03-21: qty 5

## 2016-03-21 MED ORDER — FENTANYL CITRATE (PF) 100 MCG/2ML IJ SOLN
INTRAMUSCULAR | Status: AC
Start: 2016-03-21 — End: 2016-03-21
  Filled 2016-03-21: qty 4

## 2016-03-21 MED ORDER — LABETALOL HCL 5 MG/ML IV SOLN
10.0000 mg | INTRAVENOUS | Status: DC | PRN
  Administered 2016-03-21: 20 mg via INTRAVENOUS
  Filled 2016-03-21: qty 4

## 2016-03-21 MED ORDER — PROPOFOL 10 MG/ML IV BOLUS
INTRAVENOUS | Status: AC
  Filled 2016-03-21: qty 40

## 2016-03-21 MED ORDER — THROMBIN 20000 UNITS EX SOLR
CUTANEOUS | Status: DC | PRN
  Administered 2016-03-21: 08:00:00 via TOPICAL

## 2016-03-21 MED ORDER — DEXAMETHASONE SODIUM PHOSPHATE 10 MG/ML IJ SOLN
INTRAMUSCULAR | Status: DC | PRN
  Administered 2016-03-21: 10 mg via INTRAVENOUS

## 2016-03-21 MED ORDER — SENNA 8.6 MG PO TABS
1.0000 | ORAL_TABLET | Freq: Two times a day (BID) | ORAL | Status: DC
  Administered 2016-03-21 – 2016-03-24 (×6): 8.6 mg via ORAL
  Filled 2016-03-21 (×6): qty 1

## 2016-03-21 MED ORDER — ONDANSETRON HCL 4 MG/2ML IJ SOLN
INTRAMUSCULAR | Status: DC | PRN
Start: 2016-03-21 — End: 2016-03-21
  Administered 2016-03-21: 4 mg via INTRAVENOUS

## 2016-03-21 MED ORDER — ONDANSETRON HCL 4 MG/2ML IJ SOLN
INTRAMUSCULAR | Status: AC
  Filled 2016-03-21: qty 2

## 2016-03-21 MED ORDER — VANCOMYCIN HCL 1000 MG IV SOLR
INTRAVENOUS | Status: DC | PRN
  Administered 2016-03-21: 1000 mg via TOPICAL

## 2016-03-21 MED ORDER — HYDROCODONE-ACETAMINOPHEN 5-325 MG PO TABS
1.0000 | ORAL_TABLET | ORAL | Status: DC | PRN
  Administered 2016-03-21 – 2016-03-24 (×8): 1 via ORAL
  Filled 2016-03-21 (×8): qty 1

## 2016-03-21 MED ORDER — PHENYLEPHRINE 40 MCG/ML (10ML) SYRINGE FOR IV PUSH (FOR BLOOD PRESSURE SUPPORT)
PREFILLED_SYRINGE | INTRAVENOUS | Status: AC
  Filled 2016-03-21: qty 10

## 2016-03-21 MED ORDER — PROMETHAZINE HCL 25 MG PO TABS: 12.5000 mg | ORAL_TABLET | ORAL | Status: DC | PRN

## 2016-03-21 MED ORDER — EPHEDRINE 5 MG/ML INJ
INTRAVENOUS | Status: AC
  Filled 2016-03-21: qty 10

## 2016-03-21 MED ORDER — POTASSIUM CHLORIDE IN NACL 20-0.9 MEQ/L-% IV SOLN
INTRAVENOUS | Status: DC
  Administered 2016-03-21: 12:00:00 via INTRAVENOUS
  Administered 2016-03-22: 1000 mL via INTRAVENOUS
  Filled 2016-03-21 (×2): qty 1000

## 2016-03-21 MED ORDER — DEXAMETHASONE SODIUM PHOSPHATE 10 MG/ML IJ SOLN
INTRAMUSCULAR | Status: AC
  Filled 2016-03-21: qty 1

## 2016-03-21 MED ORDER — PHENYLEPHRINE 40 MCG/ML (10ML) SYRINGE FOR IV PUSH (FOR BLOOD PRESSURE SUPPORT)
PREFILLED_SYRINGE | INTRAVENOUS | Status: DC | PRN
  Administered 2016-03-21: 80 ug via INTRAVENOUS
  Administered 2016-03-21: 40 ug via INTRAVENOUS
  Administered 2016-03-21: 80 ug via INTRAVENOUS

## 2016-03-21 MED ORDER — MIDAZOLAM HCL 2 MG/2ML IJ SOLN
INTRAMUSCULAR | Status: DC | PRN
  Administered 2016-03-21: 1 mg via INTRAVENOUS

## 2016-03-21 MED ORDER — LIDOCAINE-EPINEPHRINE 2 %-1:100000 IJ SOLN
INTRAMUSCULAR | Status: AC
  Filled 2016-03-21: qty 1

## 2016-03-21 MED ORDER — ONDANSETRON HCL 4 MG/2ML IJ SOLN: 4.0000 mg | Freq: Once | INTRAMUSCULAR | Status: DC | PRN

## 2016-03-21 MED ORDER — ONDANSETRON HCL 4 MG/2ML IJ SOLN: 4.0000 mg | INTRAMUSCULAR | Status: DC | PRN

## 2016-03-21 MED ORDER — 0.9 % SODIUM CHLORIDE (POUR BTL) OPTIME
TOPICAL | Status: DC | PRN
  Administered 2016-03-21: 1000 mL
  Administered 2016-03-21: 2000 mL

## 2016-03-21 MED ORDER — LIDOCAINE 2% (20 MG/ML) 5 ML SYRINGE
INTRAMUSCULAR | Status: DC | PRN
  Administered 2016-03-21: 100 mg via INTRAVENOUS

## 2016-03-21 MED ORDER — EPHEDRINE SULFATE-NACL 50-0.9 MG/10ML-% IV SOSY
PREFILLED_SYRINGE | INTRAVENOUS | Status: DC | PRN
  Administered 2016-03-21: 15 mg via INTRAVENOUS
  Administered 2016-03-21 (×3): 5 mg via INTRAVENOUS
  Administered 2016-03-21 (×2): 10 mg via INTRAVENOUS

## 2016-03-21 MED ORDER — ACETAMINOPHEN 650 MG RE SUPP: 650.0000 mg | RECTAL | Status: DC | PRN

## 2016-03-21 MED ORDER — MORPHINE SULFATE (PF) 2 MG/ML IV SOLN
1.0000 mg | INTRAVENOUS | Status: DC | PRN
  Administered 2016-03-21 – 2016-03-22 (×5): 2 mg via INTRAVENOUS
  Filled 2016-03-21 (×5): qty 1

## 2016-03-21 MED ORDER — MIDAZOLAM HCL 2 MG/2ML IJ SOLN
INTRAMUSCULAR | Status: AC
  Filled 2016-03-21: qty 2

## 2016-03-21 MED ORDER — ACETAMINOPHEN 325 MG PO TABS
650.0000 mg | ORAL_TABLET | ORAL | Status: DC | PRN
  Administered 2016-03-23: 650 mg via ORAL
  Filled 2016-03-21 (×2): qty 2

## 2016-03-21 MED ORDER — BACITRACIN ZINC 500 UNIT/GM EX OINT
TOPICAL_OINTMENT | CUTANEOUS | Status: AC
  Filled 2016-03-21: qty 28.35

## 2016-03-21 MED ORDER — MEPERIDINE HCL 25 MG/ML IJ SOLN: 6.2500 mg | INTRAMUSCULAR | Status: DC | PRN

## 2016-03-21 MED ORDER — THROMBIN 5000 UNITS EX SOLR
CUTANEOUS | Status: AC
  Filled 2016-03-21: qty 5000

## 2016-03-21 MED ORDER — THROMBIN 20000 UNITS EX SOLR
CUTANEOUS | Status: AC
  Filled 2016-03-21: qty 20000

## 2016-03-21 MED ORDER — ONDANSETRON HCL 4 MG PO TABS: 4.0000 mg | ORAL_TABLET | ORAL | Status: DC | PRN

## 2016-03-21 MED ORDER — ROCURONIUM BROMIDE 10 MG/ML (PF) SYRINGE
PREFILLED_SYRINGE | INTRAVENOUS | Status: DC | PRN
  Administered 2016-03-21: 30 mg via INTRAVENOUS
  Administered 2016-03-21: 50 mg via INTRAVENOUS

## 2016-03-21 MED ORDER — SUGAMMADEX SODIUM 200 MG/2ML IV SOLN
INTRAVENOUS | Status: AC
  Filled 2016-03-21: qty 2

## 2016-03-21 MED ORDER — PANTOPRAZOLE SODIUM 40 MG IV SOLR
40.0000 mg | Freq: Every day | INTRAVENOUS | Status: DC
  Administered 2016-03-21 – 2016-03-22 (×2): 40 mg via INTRAVENOUS
  Filled 2016-03-21 (×2): qty 40

## 2016-03-21 MED ORDER — LIDOCAINE 2% (20 MG/ML) 5 ML SYRINGE
INTRAMUSCULAR | Status: AC
  Filled 2016-03-21: qty 5

## 2016-03-21 MED ORDER — SODIUM CHLORIDE 0.9 % IR SOLN
Status: DC | PRN
  Administered 2016-03-21: 08:00:00

## 2016-03-21 MED ORDER — SUGAMMADEX SODIUM 200 MG/2ML IV SOLN
INTRAVENOUS | Status: DC | PRN
  Administered 2016-03-21: 200 mg via INTRAVENOUS

## 2016-03-21 SURGICAL SUPPLY — 69 items
BAG DECANTER FOR FLEXI CONT (MISCELLANEOUS) ×2 IMPLANT
BLADE CLIPPER SPEC (BLADE) ×2 IMPLANT
BUR SPIRAL ROUTER 2.3 (BUR) IMPLANT
CANISTER SUCT 3000ML PPV (MISCELLANEOUS) ×2 IMPLANT
CARTRIDGE OIL MAESTRO DRILL (MISCELLANEOUS) ×1 IMPLANT
CLIP TI MEDIUM 6 (CLIP) IMPLANT
CONT SPEC 4OZ CLIKSEAL STRL BL (MISCELLANEOUS) ×2 IMPLANT
COVER BACK TABLE 60X90IN (DRAPES) IMPLANT
DIFFUSER DRILL AIR PNEUMATIC (MISCELLANEOUS) ×1 IMPLANT
DRAPE MICROSCOPE LEICA (MISCELLANEOUS) IMPLANT
DRAPE NEUROLOGICAL W/INCISE (DRAPES) ×2 IMPLANT
DRAPE SURG 17X23 STRL (DRAPES) IMPLANT
DRAPE WARM FLUID 44X44 (DRAPE) ×2 IMPLANT
DURAPREP 26ML APPLICATOR (WOUND CARE) ×2 IMPLANT
DURAPREP 6ML APPLICATOR 50/CS (WOUND CARE) ×2 IMPLANT
ELECT CAUTERY BLADE 6.4 (BLADE) ×1 IMPLANT
ELECT REM PT RETURN 9FT ADLT (ELECTROSURGICAL) ×2
ELECTRODE REM PT RTRN 9FT ADLT (ELECTROSURGICAL) ×1 IMPLANT
EVACUATOR 1/8 PVC DRAIN (DRAIN) ×1 IMPLANT
GAUZE SPONGE 4X4 12PLY STRL (GAUZE/BANDAGES/DRESSINGS) ×2 IMPLANT
GAUZE SPONGE 4X4 16PLY XRAY LF (GAUZE/BANDAGES/DRESSINGS) IMPLANT
GLOVE BIO SURGEON STRL SZ8 (GLOVE) ×2 IMPLANT
GLOVE BIOGEL PI IND STRL 7.5 (GLOVE) IMPLANT
GLOVE BIOGEL PI INDICATOR 7.5 (GLOVE) ×1
GLOVE SS BIOGEL STRL SZ 7 (GLOVE) IMPLANT
GLOVE SUPERSENSE BIOGEL SZ 7 (GLOVE) ×2
GOWN STRL REUS W/ TWL LRG LVL3 (GOWN DISPOSABLE) ×1 IMPLANT
GOWN STRL REUS W/ TWL XL LVL3 (GOWN DISPOSABLE) ×1 IMPLANT
GOWN STRL REUS W/TWL 2XL LVL3 (GOWN DISPOSABLE) ×2 IMPLANT
GOWN STRL REUS W/TWL LRG LVL3 (GOWN DISPOSABLE) ×2
GOWN STRL REUS W/TWL XL LVL3 (GOWN DISPOSABLE) ×2
GRAFT DURAGEN MATRIX 2WX2L ×3 IMPLANT
HEMOSTAT POWDER KIT SURGIFOAM (HEMOSTASIS) IMPLANT
KIT BASIN OR (CUSTOM PROCEDURE TRAY) ×2 IMPLANT
KIT ROOM TURNOVER OR (KITS) ×2 IMPLANT
NEEDLE HYPO 22GX1.5 SAFETY (NEEDLE) ×2 IMPLANT
NS IRRIG 1000ML POUR BTL (IV SOLUTION) ×6 IMPLANT
OIL CARTRIDGE MAESTRO DRILL (MISCELLANEOUS)
PACK CRANIOTOMY (CUSTOM PROCEDURE TRAY) ×2 IMPLANT
PAD ARMBOARD 7.5X6 YLW CONV (MISCELLANEOUS) ×2 IMPLANT
PATTIES SURGICAL .25X.25 (GAUZE/BANDAGES/DRESSINGS) IMPLANT
PATTIES SURGICAL .5 X.5 (GAUZE/BANDAGES/DRESSINGS) IMPLANT
PATTIES SURGICAL .5 X3 (DISPOSABLE) IMPLANT
PATTIES SURGICAL 1X1 (DISPOSABLE) IMPLANT
PENCIL BUTTON HOLSTER BLD 10FT (ELECTRODE) ×1 IMPLANT
PERFORATOR LRG  14-11MM (BIT)
PERFORATOR LRG 14-11MM (BIT) ×1 IMPLANT
PIN MAYFIELD SKULL DISP (PIN) IMPLANT
PLATE 1.5  2HOLE MED NEURO (Plate) ×1 IMPLANT
PLATE 1.5 2HOLE MED NEURO (Plate) IMPLANT
PLATE 1.5/0.5 18.5MM BURR HOLE (Plate) ×1 IMPLANT
RUBBERBAND STERILE (MISCELLANEOUS) IMPLANT
SCREW SELF DRILL HT 1.5/4MM (Screw) ×8 IMPLANT
SPECIMEN JAR SMALL (MISCELLANEOUS) IMPLANT
SPONGE NEURO XRAY DETECT 1X3 (DISPOSABLE) IMPLANT
SPONGE SURGIFOAM ABS GEL 100 (HEMOSTASIS) ×2 IMPLANT
STAPLER VISISTAT 35W (STAPLE) ×2 IMPLANT
SUT ETHILON 3 0 FSL (SUTURE) IMPLANT
SUT NURALON 4 0 TR CR/8 (SUTURE) ×2 IMPLANT
SUT VIC AB 2-0 CP2 18 (SUTURE) ×4 IMPLANT
SWAB COLLECTION DEVICE MRSA (MISCELLANEOUS) ×1 IMPLANT
SWAB CULTURE ESWAB REG 1ML (MISCELLANEOUS) ×1 IMPLANT
SYR CONTROL 10ML LL (SYRINGE) ×2 IMPLANT
TAPE CLOTH SURG 4X10 WHT LF (GAUZE/BANDAGES/DRESSINGS) ×1 IMPLANT
TOWEL OR 17X24 6PK STRL BLUE (TOWEL DISPOSABLE) ×2 IMPLANT
TOWEL OR 17X26 10 PK STRL BLUE (TOWEL DISPOSABLE) ×2 IMPLANT
TRAY FOLEY W/METER SILVER 16FR (SET/KITS/TRAYS/PACK) IMPLANT
UNDERPAD 30X30 (UNDERPADS AND DIAPERS) IMPLANT
WATER STERILE IRR 1000ML POUR (IV SOLUTION) ×2 IMPLANT

## 2016-03-21 NOTE — Transfer of Care (Signed)
Immediate Anesthesia Transfer of Care Note  Patient: Kyle Chan  Procedure(s) Performed: Procedure(s) with comments: IRRIGATION AND DEBRIDMENT OF CRANIOTOMY  AND EXPLORATION OF WOUND (Left) - IRRIGATION AND DEBRIDMENT OF CRANIOTOMY  AND EXPLORATION OF WOUND  Patient Location: PACU  Anesthesia Type:General  Level of Consciousness: awake and patient cooperative  Airway & Oxygen Therapy: Patient Spontanous Breathing and Patient connected to nasal cannula oxygen  Post-op Assessment: Report given to RN, Post -op Vital signs reviewed and stable and Patient moving all extremities X 4  Post vital signs: Reviewed and stable  Last Vitals:  Vitals:   03/21/16 0100 03/21/16 0500  BP: 117/74 137/79  Pulse: 77 61  Resp: 15 16  Temp: 36.9 C 37 C    Last Pain:  Vitals:   03/21/16 0500  TempSrc: Oral  PainSc:       Patients Stated Pain Goal: 0 ([id number])  Complications: No apparent anesthesia complications

## 2016-03-21 NOTE — Anesthesia Postprocedure Evaluation (Signed)
Anesthesia Post Note  Patient: NACHMEN GUTERREZ  Procedure(s) Performed: Procedure(s) (LRB): IRRIGATION AND DEBRIDMENT OF CRANIOTOMY  AND EXPLORATION OF WOUND (Left)  Patient location during evaluation: PACU Anesthesia Type: General Level of consciousness: awake and alert Pain management: pain level controlled Vital Signs Assessment: post-procedure vital signs reviewed and stable Respiratory status: spontaneous breathing, nonlabored ventilation, respiratory function stable and patient connected to nasal cannula oxygen Cardiovascular status: blood pressure returned to baseline and stable Postop Assessment: no signs of nausea or vomiting Anesthetic complications: no       Last Vitals:  Vitals:   03/21/16 1000 03/21/16 1015  BP: (!) 164/74 (!) 143/75  Pulse: 81 85  Resp: (!) 23 19  Temp:      Last Pain:  Vitals:   03/21/16 1000  TempSrc:   PainSc: [place] DAVID

## 2016-03-21 NOTE — Op Note (Signed)
03/17/2016 - 03/21/2016  9:16 AM  PATIENT:  Kyle Chan  51 y.o. male  PRE-OPERATIVE DIAGNOSIS:  Subgaleal fluid collection  POST-OPERATIVE DIAGNOSIS:  same  PROCEDURE:  Left frontal craniotomy exploration with irrigation and debridement of cranial wound, removal of bone flap and dural graft, placement of bone flap and placement of subgaleal drain  SURGEON:  Sherley Bounds, MD  ASSISTANTS: none  ANESTHESIA:   General  EBL: 150 ml  Total I/O In: 1100 [I.V.:1100] Out: 150 [Blood:150]  BLOOD ADMINISTERED: none  DRAINS: Subgaleal Hemovac  SPECIMEN:  none  INDICATION FOR PROCEDURE: This patient presented with a subgaleal fluid collection 2 weeks after a craniotomy for a large meningioma. He had intermittent low-grade fevers.. Imaging showed subgaleal fluid collection. I withdrew 50 mL of fluid and sent this for CSF studies and the cultures were negative but a high white count. He was placed on empiric antibiotics. He continued to collect fluid on the subgaleal space.  Recommended cranial wound exploration. Patient understood the risks, benefits, and alternatives and potential outcomes and wished to proceed.  PROCEDURE DETAILS: The patient was taken to the operating room and after induction of adequate general endotracheal anesthesia his head was turned to the right to expose the left frontotemporal region. The area was cleaned with alcohol and prepped with DuraPrep. It was then draped in the usual sterile fashion. His old incision was opened. There was a medial release of serosanguineous fluid. The tissues looked clean. The bone flap looked clean. I saw no exudate. I removed the bone flap and removed the plates from the bone flap. The bone flap was then soaked in bacitracin saline solution. I removed the dural patch graft. The underlying brain looked somewhat macerated and I could see the Surgicel lining it. Did not see an abscess or exudate. Gently sucked away any devitalized tissue but  was careful not to manipulate the brain though I did gently look for any opening into deeper tissues. I then irrigated with copious amounts of bacitracin containing saline solution. I lined the brain with DuraGen. I decided to replace the bone flap since we had no cultures which had ever grown bacteria. New plates were used. The bone flap was then replaced and secured into position. A subgaleal Hemovac drain was placed through a separate stab incision. I then closed the galea with interrupted 2-0 Vicryl sutures after placing vancomycin powder. The skin was closed with staples. Antibiotic ointment was placed on the staples and then a sterile dressing was applied. The patient was then awakened from general anesthesia and transferred to recovery in stable condition. At the end of the procedure all sponge needle and instrument counts were correct.   PLAN OF CARE: Admit to inpatient   PATIENT DISPOSITION:  PACU - hemodynamically stable.   Delay start of Pharmacological VTE agent (>24hrs) due to surgical blood loss or risk of bleeding:  yes

## 2016-03-21 NOTE — Anesthesia Procedure Notes (Signed)
Procedure Name: Intubation Date/Time: 03/21/2016 7:39 AM Performed by: Mervyn Gay Pre-anesthesia Checklist: Patient identified, Patient being monitored, Timeout performed, Emergency Drugs available and Suction available Patient Re-evaluated:Patient Re-evaluated prior to inductionOxygen Delivery Method: Circle System Utilized Preoxygenation: Pre-oxygenation with 100% oxygen Intubation Type: IV induction Ventilation: Mask ventilation without difficulty Laryngoscope Size: Mac and 4 Grade View: Grade I Tube type: Oral Tube size: 7.5 mm Number of attempts: 1 Airway Equipment and Method: Stylet Placement Confirmation: ETT inserted through vocal cords under direct vision,  positive ETCO2 and breath sounds checked- equal and bilateral Secured at: 24 cm Tube secured with: Tape Dental Injury: Teeth and Oropharynx as per pre-operative assessment  Comments: Done by [place]

## 2016-03-21 NOTE — Anesthesia Preprocedure Evaluation (Addendum)
Anesthesia Evaluation  Patient identified by MRN, date of birth, ID band Patient awake    Reviewed: Allergy & Precautions, H&P , NPO status , Patient's Chart, lab work & pertinent test results  History of Anesthesia Complications Negative for: history of anesthetic complications  Airway Mallampati: I  TM Distance: >3 FB Neck ROM: Full    Dental no notable dental hx. (+) Dental Advisory Given, Edentulous Upper   Pulmonary neg pulmonary ROS, former smoker,    Pulmonary exam normal breath sounds clear to auscultation       Cardiovascular hypertension, Pt. on medications Normal cardiovascular exam Rhythm:Regular Rate:Normal     Neuro/Psych Brain tumor negative psych ROS   GI/Hepatic negative GI ROS, Neg liver ROS,   Endo/Other  negative endocrine ROSMorbid obesity  Renal/GU negative Renal ROS  negative genitourinary   Musculoskeletal   Abdominal (+) + obese,  Abdomen: soft. Bowel sounds: normal.  Peds  Hematology negative hematology ROS (+)   Anesthesia Other Findings   Reproductive/Obstetrics negative OB ROS                            BP Readings from Last 3 Encounters:  03/21/16 137/79  03/06/16 (!) 148/71  05/06/13 124/68   Lab Results  Component Value Date   WBC 9.5 03/20/2016   HGB 12.5 (L) 03/20/2016   HCT 36.7 (L) 03/20/2016   MCV 90.2 03/20/2016   PLT 193 03/20/2016   Lab Results  Component Value Date   INR 0.99 03/02/2016     Chemistry      Component Value Date/Time   NA 139 03/21/2016 0429   K 4.2 03/21/2016 0429   CL 106 03/21/2016 0429   CO2 24 03/21/2016 0429   BUN 8 03/21/2016 0429   CREATININE 0.82 03/21/2016 0429      Component Value Date/Time   CALCIUM 9.4 03/21/2016 0429   ALKPHOS 62 03/02/2016 1034   AST 30 03/02/2016 1034   ALT 41 03/02/2016 1034   BILITOT 0.5 03/02/2016 1034      Anesthesia Physical  Anesthesia Plan  ASA: III  Anesthesia  Plan: General   Post-op Pain Management:    Induction: Intravenous  Airway Management Planned: Oral ETT  Additional Equipment:   Intra-op Plan: Utilization of Controlled Hypotension per surrgeon request  Post-operative Plan: Extubation in OR and Possible Post-op intubation/ventilation  Informed Consent: I have reviewed the patients History and Physical, chart, labs and discussed the procedure including the risks, benefits and alternatives for the proposed anesthesia with the patient or authorized representative who has indicated his/her understanding and acceptance.   Dental advisory given  Plan Discussed with: CRNA and Surgeon  Anesthesia Plan Comments:        Anesthesia Quick Evaluation

## 2016-03-22 ENCOUNTER — Encounter (HOSPITAL_COMMUNITY): Payer: Self-pay | Admitting: Neurological Surgery

## 2016-03-22 LAB — BASIC METABOLIC PANEL
ANION GAP: 9 (ref 5–15)
BUN: 8 mg/dL (ref 6–20)
CALCIUM: 8.8 mg/dL — AB (ref 8.9–10.3)
CO2: 22 mmol/L (ref 22–32)
CREATININE: 0.88 mg/dL (ref 0.61–1.24)
Chloride: 110 mmol/L (ref 101–111)
Glucose, Bld: 191 mg/dL — ABNORMAL HIGH (ref 65–99)
Potassium: 3.9 mmol/L (ref 3.5–5.1)
Sodium: 141 mmol/L (ref 135–145)

## 2016-03-22 LAB — PATHOLOGIST SMEAR REVIEW

## 2016-03-22 LAB — VANCOMYCIN, TROUGH: Vancomycin Tr: 14 ug/mL — ABNORMAL LOW (ref 15–20)

## 2016-03-22 MED ORDER — CYANOCOBALAMIN 500 MCG PO TABS
250.0000 ug | ORAL_TABLET | Freq: Every day | ORAL | Status: DC
  Administered 2016-03-22 – 2016-03-24 (×3): 250 ug via ORAL
  Filled 2016-03-22 (×3): qty 1

## 2016-03-22 MED ORDER — SODIUM CHLORIDE 0.9 % IV SOLN
1250.0000 mg | Freq: Three times a day (TID) | INTRAVENOUS | Status: DC
  Administered 2016-03-23 – 2016-03-24 (×5): 1250 mg via INTRAVENOUS
  Filled 2016-03-22 (×6): qty 1250

## 2016-03-22 MED ORDER — POTASSIUM CHLORIDE IN NACL 20-0.9 MEQ/L-% IV SOLN
INTRAVENOUS | Status: DC
  Administered 2016-03-22 – 2016-03-24 (×3): via INTRAVENOUS
  Filled 2016-03-22 (×2): qty 1000

## 2016-03-22 NOTE — Progress Notes (Signed)
Attempted report x1. 

## 2016-03-22 NOTE — Evaluation (Addendum)
Occupational Therapy Evaluation Patient Details Name: Kyle Chan MRN: [id number] DOB: [birth year] Today's Date: 03/22/2016    History of Present Illness Pt is a 51 y/o male with a PMH of frontotemporal craniotomy 2/8 for resection of meningioma. Pt returns to [facility] as a direct admit with fever and reports of tingling in L side of his face, SOB, and feeling of swelling in his face. Imaging showed subgaleal fluid collection. Pt is now s/p L frontal craniotomy exploration with I&D of cranial wound.   Clinical Impression   PTA, pt was independent with ADL and functional mobility. Pt able to complete toilet transfers and standing ADL with supervision and VC's for problem solving. He presents with decreased short-term memory and problem solving skills impacting his ability to complete higher level IADL such as home management and medication management safely and independently. Pt also demonstrating significant word finding deficits during evaluation and feel he would benefit from SLP evaluation. He would benefit from continued OT services while admitted in anticipation for recommended D/C home with wife. Recommend outpatient OT services for OT follow-up.     Follow Up Recommendations  Outpatient OT    Equipment Recommendations  None recommended by OT    Recommendations for Other Services Speech consult     Precautions / Restrictions Precautions Precautions: Fall Precaution Comments: subgaleal drain Restrictions Weight Bearing Restrictions: No      Mobility Bed Mobility Overal bed mobility: Modified Independent Bed Mobility: Supine to Sit;Sit to Supine           General bed mobility comments: No physical assist and pt demonstrates safe technique.  Transfers Overall transfer level: Needs assistance Equipment used: None Transfers: Sit to/from Stand Sit to Stand: Supervision         General transfer comment: Supervision for safety.    Balance Overall balance assessment:  Needs assistance Sitting-balance support: Feet supported;No upper extremity supported Sitting balance-Leahy Scale: Good     Standing balance support: During functional activity;No upper extremity supported Standing balance-Leahy Scale: Fair Standing balance comment: Close supervision for safety.                            ADL Overall ADL's : Needs assistance/impaired Eating/Feeding: Set up;Sitting   Grooming: Wash/dry hands;Supervision/safety;Standing   Upper Body Bathing: Supervision/ safety;Sitting   Lower Body Bathing: Supervison/ safety;Sit to/from stand   Upper Body Dressing : Supervision/safety;Set up;Minimal assistance   Lower Body Dressing: Supervision/safety;Sit to/from stand   Toilet Transfer: Supervision/safety;Ambulation;Regular Toilet   Toileting- Water quality scientist and Hygiene: Supervision/safety;Sit to/from stand       Functional mobility during ADLs: Supervision/safety General ADL Comments: Communication and cognitive deficits impacting higher level ADL participation. Reading difficult for pt. He reports no changes in vision. Difficult to determine visual processing vs communication vs cognitive deficits.     Vision Baseline Vision/History: Wears glasses Wears Glasses: At all times Patient Visual Report: No change from baseline Vision Assessment?: Yes Eye Alignment: Within Functional Limits Ocular Range of Motion: Within Functional Limits Alignment/Gaze Preference: Within Defined Limits Tracking/Visual Pursuits: Able to track stimulus in all quads without difficulty Saccades: Within functional limits Visual Fields: No apparent deficits Additional Comments: Difficulty reading out loud but reports no changes in vision and this may be due to communication vs cognitive deficits.     Perception     Praxis      Pertinent Vitals/Pain Pain Assessment: Faces Pain Score: 6  Faces Pain Scale: Hurts a little bit  Pain Location: Incision  site Pain Descriptors / Indicators: Operative site guarding;Discomfort Pain Intervention(s): Limited activity within patient's tolerance;Monitored during session;Repositioned     Hand Dominance     Extremity/Trunk Assessment Upper Extremity Assessment Upper Extremity Assessment: Overall WFL for tasks assessed   Lower Extremity Assessment Lower Extremity Assessment: Overall WFL for tasks assessed   Cervical / Trunk Assessment Cervical / Trunk Assessment: Normal   Communication Communication Communication: Expressive difficulties   Cognition Arousal/Alertness: Awake/alert Behavior During Therapy: WFL for tasks assessed/performed Overall Cognitive Status: Difficult to assess Area of Impairment: Memory;Safety/judgement;Problem solving     Memory: Decreased short-term memory   Safety/Judgement: Decreased awareness of safety;Decreased awareness of deficits   Problem Solving: Slow processing General Comments: Decreased short-term memory during ADL tasks. Educated on benefits of compensatory strategies and will continue to address.   General Comments  Wife present during session - very mindful of drain and IV    Exercises       Shoulder Instructions      Home Living Family/patient expects to be discharged to:: Private residence Living Arrangements: Spouse/significant other Available Help at Discharge: Family;Available 24 hours/day Type of Home: House Home Access: Level entry     Home Layout: Two level;Able to live on main level with bedroom/bathroom     Bathroom Shower/Tub: Occupational psychologist: Standard Bathroom Accessibility: Yes   Home Equipment: Shower seat - built in      Lives With: Spouse    Prior Functioning/Environment Level of Independence: Independent                 OT Problem List: Impaired balance (sitting and/or standing);Decreased activity tolerance;Decreased cognition;Decreased safety awareness;Decreased knowledge of  precautions;Decreased knowledge of use of DME or AE      OT Treatment/Interventions: Self-care/ADL training;Energy conservation;DME and/or AE instruction;Therapeutic exercise;Therapeutic activities;Cognitive remediation/compensation;Patient/family education;Balance training    OT Goals(Current goals can be found in the care plan section) Acute Rehab OT Goals Patient Stated Goal: To go home OT Goal Formulation: With patient Time For Goal Achievement: 04/05/16 Potential to Achieve Goals: Good ADL Goals Pt Will Transfer to Toilet: with modified independence;ambulating;regular height toilet Pt Will Perform Tub/Shower Transfer: with modified independence;shower seat;Shower transfer;ambulating Additional ADL Goal #1: Pt will implement 3 compensatory strategies for decreased memory during daily self-care routine in order to improve ADL independence. Additional ADL Goal #2: Pt will stand at sink to complete multi-step grooming tasks without VCs for problem solving.  OT Frequency: Min 2X/week   Barriers to D/C:            Co-evaluation              End of Session Equipment Utilized During Treatment: Gait belt  Activity Tolerance: Patient tolerated treatment well Patient left: in bed;with call bell/phone within reach  OT Visit Diagnosis: Unsteadiness on feet (R26.81);Cognitive communication deficit (R41.841)                ADL either performed or assessed with clinical judgement  Time: [id number] OT Time Calculation (min): 11 min Charges:  OT General Charges $OT Visit: 1 Procedure OT Evaluation $OT Eval Moderate Complexity: 1 Procedure G-Codes:     Norman Herrlich, MS OTR/L  Pager: [place] A Ronie Barnhart 03/22/2016, 5:32 PM

## 2016-03-22 NOTE — Progress Notes (Signed)
Patient ID: Kyle Chan, male   DOB: 1965/07/07, 51 y.o.   MRN: [id number] Subjective: Patient reports no headache.  Objective: Vital signs in last 24 hours: Temp:  [97.5 F (36.4 C)-98.4 F (36.9 C)] 98.1 F (36.7 C) (02/27 0400) Pulse Rate:  [54-118] 54 (02/27 0700) Resp:  [14-25] 19 (02/27 0700) BP: (122-164)/(50-89) 132/50 (02/27 0700) SpO2:  [95 %-100 %] 97 % (02/27 0700)  Intake/Output from previous day: 02/26 0701 - 02/27 0700 In: 3358.8 [P.O.:480; I.V.:2548.8; IV Piggyback:250] Out: [id number] [Urine:3400; Drains:60; Blood:150] Intake/Output this shift: No intake/output data recorded.  Neurologic: Grossly normal with good repetition, good fluency, good resection, and some mild difficulty with naming, dressing dry  Lab Results: Lab Results  Component Value Date   WBC 9.5 03/20/2016   HGB 12.5 (L) 03/20/2016   HCT 36.7 (L) 03/20/2016   MCV 90.2 03/20/2016   PLT 193 03/20/2016   Lab Results  Component Value Date   INR 0.99 03/02/2016   BMET Lab Results  Component Value Date   NA 141 03/22/2016   K 3.9 03/22/2016   CL 110 03/22/2016   CO2 22 03/22/2016   GLUCOSE 191 (H) 03/22/2016   BUN 8 03/22/2016   CREATININE 0.88 03/22/2016   CALCIUM 8.8 (L) 03/22/2016    Studies/Results: Ct Head W & Wo Contrast  Result Date: 03/20/2016 CLINICAL DATA:  Fever of craniotomy. Elevated white count within the CSF. EXAM: CT HEAD WITHOUT AND WITH CONTRAST TECHNIQUE: Contiguous axial images were obtained from the base of the skull through the vertex without and with intravenous contrast CONTRAST:  59mL ISOVUE-300 IOPAMIDOL (ISOVUE-300) INJECTION 61% COMPARISON:  CT head without contrast 03/17/2016 and MRI brain 03/04/2016. FINDINGS: Brain: Hypoattenuation of the left temporal lobe and frontal operculum is similar the prior study. There is minimal extra-axial fluid subjacent the craniotomy site. Maximal dimensions on coronal imaging demonstrate slight reduction in size of the  extracranial collection, now measuring 6.5 x 11 mm. The postcontrast images demonstrate enhancement about the surgical cavity with continued concern for infection. Midline shift is stable a 4 mm at the level of foramen of Monro. Vascular: No hyperdense vessel or unexpected calcification. Visible vessels are patent. Skull: Left frontal craniotomy is stable. No focal lytic or blastic lesions are present. There is no lysis about the craniotomy site. Sinuses/Orbits: Mild mucosal thickening is present within the maxillary sinuses and ethmoid air cells bilaterally. Mastoid air cells are clear. The globes and orbits are within normal limits. IMPRESSION: 1. 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 All portions are postsurgical, of infection remains a concern. 2. Stable 4 mm midline shift 3. Minimal extra-axial fluid subjacent to the craniotomy. 4. Slight decrease in size of the extracranial fluid collection. 5. Mild sinus disease. Electronically Signed   By: San Morelle M.D.   On: 03/20/2016 12:09    Assessment/Plan: Well postop day 1 with mild naming difficulty, transferred to the floor today, continue empiric antibiotics, await cultures   LOS: 5 days    Telitha Plath S 03/22/2016, 8:09 AM

## 2016-03-22 NOTE — Progress Notes (Signed)
Pharmacy Antibiotic Note Kyle Chan is a 51 y.o. male admitted on 03/17/2016 with fever and concern for surgical site infection in setting of recent craniotomy for meningioma. The patient is currently receiving Vancomycin + Rocephin.   A Vancomycin trough this evening resulted as 14 mcg/ml however with correction for the afternoon dose given a little late - is likely closer to 12 mcg/ml and remains SUBtherapeutic. RN instructed to give 1g dose already on the floor. Will increase the dose started on 2/28.   Plan: 1. Adjust Vancomycin to 1250 mg IV every 8 hours 2. Will continue to follow renal function, culture results, LOT, and antibiotic de-escalation plans   Height: 5\' 9"  (175.3 cm) Weight: 238 lb 5.1 oz (108.1 kg) IBW/kg (Calculated) : 70.7  Temp (24hrs), Avg:98.3 F (36.8 C), Min:98 F (36.7 C), Max:98.8 F (37.1 C)   Recent Labs Lab 03/17/16 1922 03/19/16 0244 03/20/16 0446 03/20/16 1022 03/20/16 1758 03/21/16 0429 03/22/16 0344 03/22/16 2054  WBC 17.5* 11.3* 11.9*  --  9.5  --   --   --   CREATININE 1.07 0.83  --  0.89  --  0.82 0.88  --   VANCOTROUGH  --   --   --  7*  --   --   --  14*    Estimated Creatinine Clearance: 119 mL/min (by C-G formula based on SCr of 0.88 mg/dL).    No Known Allergies  Vancomycin 2/22>> Ceftriaxone 2/22>>  2/25 VT: 7 on 1g q12h 2/26 VT 14 (c12) on 1g/8h >> adjust to 1250 mg/8h  2/22 CSF: Gstain: neg; ngtd 2/22 BCx: ngtd 2/24 cranial abscess: px 2/24: BCx: ngtd 2/26 mrsa pcr: neg 2/25 CSF cx >> 2/26 WCx >> 2/26 Tissue cx >>  Thank you for allowing pharmacy to be a part of this patient's care.  Alycia Rossetti, PharmD, BCPS Clinical Pharmacist Pager: [phone] 03/22/2016 10:34 PM

## 2016-03-22 NOTE — Evaluation (Signed)
Physical Therapy Evaluation Patient Details Name: Kyle Chan MRN: [id number] DOB: 15-Aug-1965 Today's Date: 03/22/2016   History of Present Illness  Pt is a 51 y/o male with a PMH of frontotemporal craniotomy 2/8 for resection of meningioma. Pt returns to [facility] as a direct admit with fever and reports of tingling in L side of his face, SOB, and feeling of swelling in his face. Imaging showed subgaleal fluid collection. Pt is now s/p L frontal craniotomy exploration with I&D of cranial wound.  Clinical Impression  Pt admitted with above diagnosis. Pt currently with functional limitations due to the deficits listed below (see PT Problem List). At the time of PT eval pt was able to perform transfers and ambulation with modified independence to supervision for safety. Pt's wife present during session and very mindful of precautions (drain, IV pole, etc). Pt reports he has been independent at home following his previous surgery, and anticipates no difficulty when returning home this time either. Pt will benefit from skilled PT to increase their independence and safety with mobility to allow discharge to the venue listed below. Will keep on PT caseload throughout admission until pt reaches modified independent level with all functional mobility.    Follow Up Recommendations No PT follow up    Equipment Recommendations  None recommended by PT    Recommendations for Other Services       Precautions / Restrictions Precautions Precautions: Fall Precaution Comments: subgaleal drain Restrictions Weight Bearing Restrictions: No      Mobility  Bed Mobility Overal bed mobility: Modified Independent Bed Mobility: Supine to Sit           General bed mobility comments: Pt was able to transition to EOB without assistance. Pt into long sitting first and then scooted around to EOB.   Transfers Overall transfer level: Needs assistance Equipment used: None Transfers: Sit to/from Stand Sit to  Stand: Supervision         General transfer comment: Close supervision for safety. Pt was able to power-up to full standing position without LOB.   Ambulation/Gait Ambulation/Gait assistance: Min guard;Supervision Ambulation Distance (Feet): 500 Feet Assistive device: None Gait Pattern/deviations: Step-through pattern;Decreased stride length Gait velocity: Decreased Gait velocity interpretation: Below normal speed for age/gender General Gait Details: Steady with ambulation, no overt LOB noted. Initially with min guard assist progressing to supervision for safety.   Stairs Stairs: Yes          Wheelchair Mobility    Modified Rankin (Stroke Patients Only)       Balance Overall balance assessment: Needs assistance Sitting-balance support: Feet supported;No upper extremity supported Sitting balance-Leahy Scale: Good     Standing balance support: During functional activity;No upper extremity supported Standing balance-Leahy Scale: Fair                               Pertinent Vitals/Pain Pain Assessment: 0-10 Pain Score: 6  Pain Location: Incision site Pain Descriptors / Indicators: Operative site guarding;Discomfort Pain Intervention(s): Limited activity within patient's tolerance;Monitored during session;Repositioned    Home Living Family/patient expects to be discharged to:: Private residence Living Arrangements: Spouse/significant other Available Help at Discharge: Family;Available 24 hours/day Type of Home: House Home Access: Level entry     Home Layout: Two level;Able to live on main level with bedroom/bathroom Home Equipment: Shower seat - built in      Prior Function Level of Independence: Independent  Hand Dominance        Extremity/Trunk Assessment   Upper Extremity Assessment Upper Extremity Assessment: Defer to OT evaluation    Lower Extremity Assessment Lower Extremity Assessment: Overall WFL for tasks  assessed    Cervical / Trunk Assessment Cervical / Trunk Assessment: Normal  Communication   Communication: Expressive difficulties  Cognition Arousal/Alertness: Awake/alert Behavior During Therapy: WFL for tasks assessed/performed Overall Cognitive Status: Impaired/Different from baseline Area of Impairment: Memory;Safety/judgement     Memory: Decreased short-term memory   Safety/Judgement: Decreased awareness of safety;Decreased awareness of deficits          General Comments General comments (skin integrity, edema, etc.): Wife present during session - very mindful of drain and IV    Exercises     Assessment/Plan    PT Assessment Patient needs continued PT services  PT Problem List Decreased strength;Decreased range of motion;Decreased activity tolerance;Decreased balance;Decreased mobility;Decreased knowledge of use of DME;Decreased safety awareness;Decreased knowledge of precautions;Pain       PT Treatment Interventions DME instruction;Gait training;Stair training;Functional mobility training;Therapeutic activities;Therapeutic exercise;Neuromuscular re-education;Patient/family education    PT Goals (Current goals can be found in the Care Plan section)  Acute Rehab PT Goals Patient Stated Goal: To go home PT Goal Formulation: With patient/family Time For Goal Achievement: 04/05/16 Potential to Achieve Goals: Good    Frequency Min 3X/week   Barriers to discharge        Co-evaluation               End of Session Equipment Utilized During Treatment: Gait belt Activity Tolerance: Patient tolerated treatment well Patient left: in chair;with call bell/phone within reach;with chair alarm set;with family/visitor present Nurse Communication: Mobility status PT Visit Diagnosis: Other abnormalities of gait and mobility (R26.89)         Time: 1331-1400 PT Time Calculation (min) (ACUTE ONLY): 29 min   Charges:   PT Evaluation $PT Eval Moderate Complexity:  1 Procedure PT Treatments $Gait Training: 8-22 mins   PT G Codes:         Thelma Comp 03/22/2016, 2:24 PM   Rolinda Roan, PT, DPT Acute Rehabilitation Services Pager: [phone]

## 2016-03-23 LAB — BASIC METABOLIC PANEL
Anion gap: 7 (ref 5–15)
BUN: 9 mg/dL (ref 6–20)
CHLORIDE: 110 mmol/L (ref 101–111)
CO2: 24 mmol/L (ref 22–32)
Calcium: 8.1 mg/dL — ABNORMAL LOW (ref 8.9–10.3)
Creatinine, Ser: 0.81 mg/dL (ref 0.61–1.24)
GFR calc Af Amer: >60 mL/min (ref >60)
GFR calc non Af Amer: >60 mL/min (ref >60)
GLUCOSE: 97 mg/dL (ref 65–99)
POTASSIUM: 4.7 mmol/L (ref 3.5–5.1)
Sodium: 141 mmol/L (ref 135–145)

## 2016-03-23 LAB — CBC WITH DIFFERENTIAL/PLATELET
Basophils Absolute: 0 10*3/uL (ref 0.0–0.1)
Basophils Relative: 0 %
EOS PCT: 1 %
Eosinophils Absolute: 0.1 10*3/uL (ref 0.0–0.7)
HCT: 33 % — ABNORMAL LOW (ref 39.0–52.0)
Hemoglobin: 11 g/dL — ABNORMAL LOW (ref 13.0–17.0)
LYMPHS ABS: 2.7 10*3/uL (ref 0.7–4.0)
Lymphocytes Relative: 23 %
MCH: 30.2 pg (ref 26.0–34.0)
MCHC: 33.3 g/dL (ref 30.0–36.0)
MCV: 90.7 fL (ref 78.0–100.0)
MONO ABS: 0.8 10*3/uL (ref 0.1–1.0)
MONOS PCT: 7 %
Neutro Abs: 8.2 10*3/uL — ABNORMAL HIGH (ref 1.7–7.7)
Neutrophils Relative %: 69 %
PLATELETS: 201 10*3/uL (ref 150–400)
RBC: 3.64 MIL/uL — AB (ref 4.22–5.81)
RDW: 13.9 % (ref 11.5–15.5)
WBC: 11.8 10*3/uL — ABNORMAL HIGH (ref 4.0–10.5)

## 2016-03-23 LAB — CULTURE, BLOOD (ROUTINE X 2)
Culture: NO GROWTH
Culture: NO GROWTH

## 2016-03-23 LAB — CSF CULTURE: CULTURE: NO GROWTH

## 2016-03-23 LAB — AEROBIC CULTURE  (SUPERFICIAL SPECIMEN): CULTURE: NO GROWTH

## 2016-03-23 LAB — AEROBIC CULTURE W GRAM STAIN (SUPERFICIAL SPECIMEN)

## 2016-03-23 LAB — CSF CULTURE W GRAM STAIN

## 2016-03-23 MED ORDER — PANTOPRAZOLE SODIUM 40 MG PO TBEC
40.0000 mg | DELAYED_RELEASE_TABLET | Freq: Every day | ORAL | Status: DC
  Administered 2016-03-23: 40 mg via ORAL
  Filled 2016-03-23: qty 1

## 2016-03-23 NOTE — Progress Notes (Addendum)
Occupational Therapy Treatment Patient Details Name: Kyle Chan MRN: [id number] DOB: March 20, 1965 Today's Date: 03/23/2016    History of present illness Pt is Kyle 51 y/o male with Kyle PMH of frontotemporal craniotomy 2/8 for resection of meningioma. Pt returns to [facility] as Kyle direct admit with fever and reports of tingling in L side of his face, SOB, and feeling of swelling in his face. Imaging showed subgaleal fluid collection. Pt is now s/p L frontal craniotomy exploration with I&D of cranial wound.   OT comments  Pt demonstrates good progress toward OT goals. Session focused on cognitive skills related to ADL and IADL participation. Pt benefits from minimal verbal cueing for problem solving during higher level tasks. He continues to demonstrate difficulty with short-term memory which is impacting his safety with higher level IADL including medication management. He was able to identify list making as an effective compensatory strategy. He demonstrated improved communication skills this session but continued to have difficulty reading his menu allowed aloud. Visual scanning and visual motor skills in tact during session. He would benefit from continued OT services while admitted in preparation for anticipated D/C home with wife and outpatient OT services.   Follow Up Recommendations  Outpatient OT    Equipment Recommendations  None recommended by OT    Recommendations for Other Services Speech consult    Precautions / Restrictions Precautions Precautions: Fall Precaution Comments: subgaleal drain Restrictions Weight Bearing Restrictions: No       Mobility Bed Mobility               General bed mobility comments: OOB in chair on arrival.  Transfers Overall transfer level: Needs assistance Equipment used: None Transfers: Sit to/from Stand Sit to Stand: Supervision         General transfer comment: Supervision for safety.    Balance Overall balance assessment: Needs  assistance Sitting-balance support: Feet supported;No upper extremity supported Sitting balance-Leahy Scale: Good     Standing balance support: During functional activity;No upper extremity supported Standing balance-Leahy Scale: Good                     ADL Overall ADL's : Needs assistance/impaired                         Toilet Transfer: Supervision/safety;Ambulation;Regular Toilet             General ADL Comments: Session focused on higher level cognitive skills. Pt able to follow multi-step commands with min VC's to remember sequence of tasks.       Vision                 Additional Comments: Able to complete complex visual scanning sheet with 1 VC (pt identifying "EE" and asking if "FF" was the correct letter sequence to circle). When reading menu, able to copy correct words but when copying and reading aloud he wrote the correct word and spoke the incorrect word.   Perception     Praxis      Cognition   Behavior During Therapy: WFL for tasks assessed/performed Overall Cognitive Status: Difficult to assess Area of Impairment: Memory;Safety/judgement;Problem solving     Memory: Decreased short-term memory    Safety/Judgement: Decreased awareness of safety;Decreased awareness of deficits   Problem Solving: Slow processing General Comments: Educated pt on compensatory strategies for memory during ADL. Continues to require min cueing for problem solving during visual construction test. Able to near copy accurately while  reading aloud but unable to speak the correct word. No awareness of this deficit.      Exercises     Shoulder Instructions       General Comments      Pertinent Vitals/ Pain       Pain Assessment: No/denies pain  Home Living                                          Prior Functioning/Environment              Frequency  Min 2X/week        Progress Toward Goals  OT Goals(current goals  can now be found in the care plan section)  Progress towards OT goals: Progressing toward goals  Acute Rehab OT Goals Patient Stated Goal: To go home OT Goal Formulation: With patient Time For Goal Achievement: 04/05/16 Potential to Achieve Goals: Good ADL Goals Pt Will Transfer to Toilet: with modified independence;ambulating;regular height toilet Pt Will Perform Tub/Shower Transfer: with modified independence;shower seat;Shower transfer;ambulating Additional ADL Goal #1: Pt will implement 3 compensatory strategies for decreased memory during daily self-care routine in order to improve ADL independence. Additional ADL Goal #2: Pt will stand at sink to complete multi-step grooming tasks without VCs for problem solving.  Plan Discharge plan remains appropriate    Co-evaluation                 End of Session Equipment Utilized During Treatment: Gait belt  OT Visit Diagnosis: Unsteadiness on feet (R26.81);Cognitive communication deficit (R41.841)   Activity Tolerance Patient tolerated treatment well   Patient Left in chair;with family/visitor present   Nurse Communication          Time: 1510-1540 OT Time Calculation (min): 30 min  Charges: OT General Charges $OT Visit: 1 Procedure OT Treatments $Therapeutic Activity: 23-37 mins  Kyle Herrlich, MS OTR/L  Pager: [place] Kyle Chan 03/23/2016, 4:45 PM

## 2016-03-23 NOTE — Progress Notes (Signed)
Patient ID: Kyle Chan, male   DOB: August 09, 1965, 51 y.o.   MRN: [id number] Subjective: Patient reports he feels good. No headache. No numbness tingling or weakness. Speech is improved.  Objective: Vital signs in last 24 hours: Temp:  [97.7 F (36.5 C)-98.8 F (37.1 C)] 97.7 F (36.5 C) (02/28 0936) Pulse Rate:  [53-73] 60 (02/28 0936) Resp:  [16-20] 20 (02/28 0936) BP: (132-153)/(76-84) 135/77 (02/28 0936) SpO2:  [96 %-98 %] 97 % (02/28 0936)  Intake/Output from previous day: 02/27 0701 - 02/28 0700 In: 1500 [P.O.:120; I.V.:830; IV Piggyback:450] Out: [id number] [Urine:475; Drains:170] Intake/Output this shift: No intake/output data recorded.  Neurologic: Grossly normal  Lab Results: Lab Results  Component Value Date   WBC 11.8 (H) 03/23/2016   HGB 11.0 (L) 03/23/2016   HCT 33.0 (L) 03/23/2016   MCV 90.7 03/23/2016   PLT 201 03/23/2016   Lab Results  Component Value Date   INR 0.99 03/02/2016   BMET Lab Results  Component Value Date   NA 141 03/23/2016   K 4.7 03/23/2016   CL 110 03/23/2016   CO2 24 03/23/2016   GLUCOSE 97 03/23/2016   BUN 9 03/23/2016   CREATININE 0.81 03/23/2016   CALCIUM 8.1 (L) 03/23/2016    Studies/Results: No results found.  Assessment/Plan: Overall doing very well. Continue to mobilize. Speech better. Await cultures.   LOS: 6 days    Kyle Chan S 03/23/2016, 1:36 PM

## 2016-03-23 NOTE — Progress Notes (Signed)
[facility]  New pt with [facility] this hospital admission.  [facility] team will follow Kyle Chan while an inpatient to support home care and home IV ABX needs at DC to home.  If patient discharges after hours, please call [phone].   Kyle Chan 03/23/2016, 11:07 PM

## 2016-03-23 NOTE — Care Management Note (Signed)
Case Management Note  Patient Details  Name: VAUGHN BICKERS MRN: [id number] Date of Birth: 01/16/1966  Subjective/Objective:                    Action/Plan: CM spoke with patient and his wife about the plan at d/c for IV therapy at home. CM offered them a list of [facility] agencies but she had already decided she wanted him to use [facility] [place]. Pam with [facility] IV therapy notified. CM continuing to follow for d/c needs, physician orders.   Expected Discharge Date:                  Expected Discharge Plan:  [place]  In-House Referral:     Discharge planning Services  CM Consult  Post Acute Care Choice:  Home Health Choice offered to:  Spouse, Patient  DME Arranged:    DME Agency:     HH Arranged:  RN, IV Antibiotics HH Agency:     Status of Service:  In process, will continue to follow  If discussed at Long Length of Stay Meetings, dates discussed:    Additional Comments:  Pollie Friar, RN 03/23/2016, 12:33 PM

## 2016-03-24 LAB — CULTURE, BLOOD (SINGLE): CULTURE: NO GROWTH

## 2016-03-24 MED ORDER — DEXTROSE 5 % IV SOLN: 2.0000 g | Freq: Two times a day (BID) | INTRAVENOUS | 5 refills | Status: DC

## 2016-03-24 MED ORDER — VANCOMYCIN HCL 10 G IV SOLR: 1250.0000 mg | Freq: Two times a day (BID) | INTRAVENOUS | 5 refills | Status: DC

## 2016-03-24 MED ORDER — HEPARIN SOD (PORK) LOCK FLUSH 100 UNIT/ML IV SOLN
250.0000 [IU] | INTRAVENOUS | Status: AC | PRN
  Administered 2016-03-24: 250 [IU]

## 2016-03-24 NOTE — Discharge Summary (Signed)
Physician Discharge Summary  Patient ID: Kyle Chan MRN: [id number] DOB/AGE: 1965/03/10 51 y.o.  Admit date: 03/17/2016 Discharge date: 03/24/2016  Admission Diagnoses: subgaleal Fluid collection/fever    Discharge Diagnoses: Same   Discharged Condition: good  Hospital Course: The patient was admitted on 03/17/2016 with low-grade fevers. He had a subgaleal fluid collection. He underwent CT scanning. I tapped with a fluid collection on 3 separate days and sent for cultures. Cultures remain negative with a high white count. As either an inflammatory reaction or some type of low-grade infection. He was put on empiric Ceftriaxone and vancomycin. He was taken to the operating room where the patient underwent exploratory craniotomy with irrigation and debridement of the wound and placement of a subgaleal drain. The patient tolerated the procedure well and was taken to the recovery room and then to the ICU in stable condition. He was transferred to the floor the next day. The hospital course was routine from there. There were no complications. The wound remained clean dry and intact.  His drain was removed on the day of discharge. The patient remained afebrile with stable vital signs, and tolerated a regular diet. The patient continued to increase activities, and pain was well controlled with oral pain medications. He will be discharged to home on empiric antibiotics for 4 weeks though his cultures remained negative to date.  Consults: None  Significant Diagnostic Studies:  Results for orders placed or performed during the hospital encounter of 03/17/16  CSF culture  Result Value Ref Range   Specimen Description CSF    Special Requests NONE    Gram Stain      ABUNDANT WBC PRESENT, PREDOMINANTLY PMN NO ORGANISMS SEEN DIRECT SMEAR    Culture NO GROWTH 3 DAYS    Report Status 03/21/2016 FINAL   Culture, blood (routine x 2)  Result Value Ref Range   Specimen Description BLOOD LEFT HAND     Special Requests BOTTLES DRAWN AEROBIC ONLY [place]    Culture NO GROWTH 5 DAYS    Report Status 03/23/2016 FINAL   Culture, blood (routine x 2)  Result Value Ref Range   Specimen Description BLOOD RIGHT ANTECUBITAL    Special Requests BOTTLES DRAWN AEROBIC AND ANAEROBIC 5CC    Culture NO GROWTH 5 DAYS    Report Status 03/23/2016 FINAL   Culture, blood (single)  Result Value Ref Range   Specimen Description BLOOD LEFT HAND    Special Requests BOTTLES DRAWN AEROBIC AND ANAEROBIC 5CC    Culture NO GROWTH 5 DAYS    Report Status 03/24/2016 FINAL   Aerobic Culture (superficial specimen)  Result Value Ref Range   Specimen Description ABSCESS HEAD    Special Requests CRANIAL ABSCESS PER RN NOT CSF    Gram Stain      ABUNDANT WBC PRESENT, PREDOMINANTLY PMN NO ORGANISMS SEEN    Culture NO GROWTH 2 DAYS    Report Status 03/21/2016 FINAL   CSF culture  Result Value Ref Range   Specimen Description CSF    Special Requests shunt    Gram Stain      ABUNDANT WBC PRESENT, PREDOMINANTLY PMN NO ORGANISMS SEEN    Culture NO GROWTH 3 DAYS    Report Status 03/23/2016 FINAL   Anaerobic culture  Result Value Ref Range   Specimen Description WOUND LEFT    Special Requests LEFT SKULL SPECIMEN A    Culture      NO ANAEROBES ISOLATED; CULTURE IN PROGRESS FOR 5 DAYS   Report Status  PENDING   Gram stain  Result Value Ref Range   Specimen Description WOUND LEFT    Special Requests LEFT SKULL SPECIMEN A     Gram Stain      MODERATE WBC PRESENT,BOTH PMN AND MONONUCLEAR NO ORGANISMS SEEN    Report Status 03/21/2016 FINAL   Fungus Culture With Stain  Result Value Ref Range   Fungus Stain Final report    Fungus (Mycology) Culture PENDING    Fungal Source TISSUE   Fungus Culture With Stain  Result Value Ref Range   Fungus Stain Final report    Fungus (Mycology) Culture PENDING    Fungal Source WOUND   Fungus Culture With Stain  Result Value Ref Range   Fungus Stain Final report    Fungus  (Mycology) Culture PENDING    Fungal Source WOUND   Aerobic/Anaerobic Culture (surgical/deep wound)  Result Value Ref Range   Specimen Description TISSUE    Special Requests DURAL PATCH GRAFT    Gram Stain      FEW WBC PRESENT,BOTH PMN AND MONONUCLEAR NO ORGANISMS SEEN    Culture      NO GROWTH 2 DAYS NO ANAEROBES ISOLATED; CULTURE IN PROGRESS FOR 5 DAYS   Report Status PENDING   Aerobic Culture (superficial specimen)  Result Value Ref Range   Specimen Description WOUND LEFT    Special Requests LEFT SUBDURAL    Gram Stain      FEW WBC PRESENT,BOTH PMN AND MONONUCLEAR NO ORGANISMS SEEN    Culture NO GROWTH 2 DAYS    Report Status 03/23/2016 FINAL   MRSA PCR Screening  Result Value Ref Range   MRSA by PCR NEGATIVE NEGATIVE  Fungus Culture Result  Result Value Ref Range   Result 1 Comment   Fungus Culture Result  Result Value Ref Range   Result 1 Comment   Fungus Culture Result  Result Value Ref Range   Result 1 Comment   HIV antibody (Routine Testing)  Result Value Ref Range   HIV Screen 4th Generation wRfx Non Reactive Non Reactive  Basic metabolic panel  Result Value Ref Range   Sodium 140 135 - 145 mmol/L   Potassium 3.7 3.5 - 5.1 mmol/L   Chloride 103 101 - 111 mmol/L   CO2 26 22 - 32 mmol/L   Glucose, Bld 163 (H) 65 - 99 mg/dL   BUN 8 6 - 20 mg/dL   Creatinine, Ser 1.07 0.61 - 1.24 mg/dL   Calcium 9.3 8.9 - 10.3 mg/dL   GFR calc non Af Amer >60 >60 mL/min   GFR calc Af Amer >60 >60 mL/min   Anion gap 11 5 - 15  CBC WITH DIFFERENTIAL  Result Value Ref Range   WBC 17.5 (H) 4.0 - 10.5 K/uL   RBC 4.48 4.22 - 5.81 MIL/uL   Hemoglobin 13.8 13.0 - 17.0 g/dL   HCT 40.7 39.0 - 52.0 %   MCV 90.8 78.0 - 100.0 fL   MCH 30.8 26.0 - 34.0 pg   MCHC 33.9 30.0 - 36.0 g/dL   RDW 14.3 11.5 - 15.5 %   Platelets 220 150 - 400 K/uL   Neutrophils Relative % 84 %   Lymphocytes Relative 8 %   Monocytes Relative 4 %   Eosinophils Relative 0 %   Basophils Relative 0 %    Band Neutrophils 1 %   Metamyelocytes Relative 0 %   Myelocytes 1 %   Promyelocytes Absolute 0 %   Blasts 0 %  nRBC 0 0 /100 WBC   Other 2 %   Neutro Abs 15.1 (H) 1.7 - 7.7 K/uL   Lymphs Abs 1.4 0.7 - 4.0 K/uL   Monocytes Absolute 0.7 0.1 - 1.0 K/uL   Eosinophils Absolute 0.0 0.0 - 0.7 K/uL   Basophils Absolute 0.0 0.0 - 0.1 K/uL   RBC Morphology POLYCHROMASIA PRESENT    WBC Morphology MILD LEFT SHIFT (1-5% METAS, OCC MYELO, OCC BANDS)   Urinalysis, Routine w reflex microscopic  Result Value Ref Range   Color, Urine YELLOW YELLOW   APPearance CLEAR CLEAR   Specific Gravity, Urine 1.017 1.005 - 1.030   pH 6.0 5.0 - 8.0   Glucose, UA NEGATIVE NEGATIVE mg/dL   Hgb urine dipstick NEGATIVE NEGATIVE   Bilirubin Urine NEGATIVE NEGATIVE   Ketones, ur NEGATIVE NEGATIVE mg/dL   Protein, ur NEGATIVE NEGATIVE mg/dL   Nitrite NEGATIVE NEGATIVE   Leukocytes, UA TRACE (A) NEGATIVE   RBC / HPF 0-5 0 - 5 RBC/hpf   WBC, UA 6-30 0 - 5 WBC/hpf   Bacteria, UA NONE SEEN NONE SEEN   Squamous Epithelial / LPF 0-5 (A) NONE SEEN   Mucous PRESENT   Protein and glucose, CSF  Result Value Ref Range   Glucose, CSF <20 (LL) 40 - 70 mg/dL   Total  Protein, CSF >600 (H) 15 - 45 mg/dL  CSF cell count with differential  Result Value Ref Range   Tube # SHUNT    Color, CSF RED (A) COLORLESS   Appearance, CSF CLOUDY (A) CLEAR   Supernatant XANTHOCHROMIC    RBC Count, CSF 3,450 (H) 0 /cu mm   WBC, CSF 20,900 (HH) 0 - 5 /cu mm   Segmented Neutrophils-CSF 88 (H) 0 - 6 %   Lymphs, CSF 8 (L) 40 - 80 %   Monocyte-Macrophage-Spinal Fluid 4 (L) 15 - 45 %  Basic metabolic panel  Result Value Ref Range   Sodium 139 135 - 145 mmol/L   Potassium 3.5 3.5 - 5.1 mmol/L   Chloride 106 101 - 111 mmol/L   CO2 22 22 - 32 mmol/L   Glucose, Bld 139 (H) 65 - 99 mg/dL   BUN 7 6 - 20 mg/dL   Creatinine, Ser 0.83 0.61 - 1.24 mg/dL   Calcium 8.6 (L) 8.9 - 10.3 mg/dL   GFR calc non Af Amer >60 >60 mL/min   GFR calc Af  Amer >60 >60 mL/min   Anion gap 11 5 - 15  CBC  Result Value Ref Range   WBC 11.3 (H) 4.0 - 10.5 K/uL   RBC 3.85 (L) 4.22 - 5.81 MIL/uL   Hemoglobin 11.9 (L) 13.0 - 17.0 g/dL   HCT 34.9 (L) 39.0 - 52.0 %   MCV 90.6 78.0 - 100.0 fL   MCH 30.9 26.0 - 34.0 pg   MCHC 34.1 30.0 - 36.0 g/dL   RDW 14.2 11.5 - 15.5 %   Platelets 182 150 - 400 K/uL  Body fluid cell count with differential  Result Value Ref Range   Fluid Type-FCT CSF    Color, Fluid      THIS TEST WAS ORDERED IN ERROR AND HAS BEEN CREDITED.   Appearance, Fluid (A) CLEAR    THIS TEST WAS ORDERED IN ERROR AND HAS BEEN CREDITED.   WBC, Fluid  0 - 1,000 cu mm    THIS TEST WAS ORDERED IN ERROR AND HAS BEEN CREDITED.   Neutrophil Count, Fluid  0 - 25 %  THIS TEST WAS ORDERED IN ERROR AND HAS BEEN CREDITED.   Lymphs, Fluid  %    THIS TEST WAS ORDERED IN ERROR AND HAS BEEN CREDITED.   Monocyte-Macrophage-Serous Fluid  50 - 90 %    THIS TEST WAS ORDERED IN ERROR AND HAS BEEN CREDITED.   Eos, Fluid  %    THIS TEST WAS ORDERED IN ERROR AND HAS BEEN CREDITED.   Other Cells, Fluid  %    THIS TEST WAS ORDERED IN ERROR AND HAS BEEN CREDITED.  Body fluid cell count with differential  Result Value Ref Range   Fluid Type-FCT ABSCESS    Color, Fluid RED (A) YELLOW   Appearance, Fluid CLOUDY (A) CLEAR   WBC, Fluid 19,250 (H) 0 - 1,000 cu mm   Neutrophil Count, Fluid 90 (H) 0 - 25 %   Lymphs, Fluid 8 %   Monocyte-Macrophage-Serous Fluid 1 (L) 50 - 90 %   Eos, Fluid 1 %  Miscellaneous [facility] test (send-out)  Result Value Ref Range   [facility] test code [id number]    [facility] test name PROTEIN FLUID    Source ([facility]) LEFT FRONTAL CRANIUM ABSCESS    Misc [facility] result COMMENT   Basic metabolic panel  Result Value Ref Range   Sodium 139 135 - 145 mmol/L   Potassium 3.4 (L) 3.5 - 5.1 mmol/L   Chloride 107 101 - 111 mmol/L   CO2 24 22 - 32 mmol/L   Glucose, Bld 136 (H) 65 - 99 mg/dL   BUN 6 6 - 20 mg/dL   Creatinine, Ser 0.89 0.61  - 1.24 mg/dL   Calcium 8.7 (L) 8.9 - 10.3 mg/dL   GFR calc non Af Amer >60 >60 mL/min   GFR calc Af Amer >60 >60 mL/min   Anion gap 8 5 - 15  Vancomycin, trough  Result Value Ref Range   Vancomycin Tr 7 (L) 15 - 20 ug/mL  CBC  Result Value Ref Range   WBC 9.5 4.0 - 10.5 K/uL   RBC 4.07 (L) 4.22 - 5.81 MIL/uL   Hemoglobin 12.5 (L) 13.0 - 17.0 g/dL   HCT 36.7 (L) 39.0 - 52.0 %   MCV 90.2 78.0 - 100.0 fL   MCH 30.7 26.0 - 34.0 pg   MCHC 34.1 30.0 - 36.0 g/dL   RDW 13.9 11.5 - 15.5 %   Platelets 193 150 - 400 K/uL  CBC  Result Value Ref Range   WBC 11.9 (H) 4.0 - 10.5 K/uL   RBC 4.00 (L) 4.22 - 5.81 MIL/uL   Hemoglobin 12.2 (L) 13.0 - 17.0 g/dL   HCT 36.0 (L) 39.0 - 52.0 %   MCV 90.0 78.0 - 100.0 fL   MCH 30.5 26.0 - 34.0 pg   MCHC 33.9 30.0 - 36.0 g/dL   RDW 13.9 11.5 - 15.5 %   Platelets 193 150 - 400 K/uL  Body fluid cell count with differential  Result Value Ref Range   Fluid Type-FCT CSF IN A CUP    Color, Fluid RED (A) YELLOW   Appearance, Fluid TURBID (A) CLEAR   WBC, Fluid 71,250 (H) 0 - 1,000 cu mm   Neutrophil Count, Fluid 99 (H) 0 - 25 %   Lymphs, Fluid 1 %   Monocyte-Macrophage-Serous Fluid 0 (L) 50 - 90 %   Eos, Fluid 0 %  Protein and glucose, CSF  Result Value Ref Range   Glucose, CSF <20 (LL) 40 - 70 mg/dL   Total  Protein,  CSF >600 (H) 15 - 45 mg/dL  Sedimentation rate  Result Value Ref Range   Sed Rate 62 (H) 0 - 16 mm/hr  C-reactive protein  Result Value Ref Range   CRP 5.1 (H) <1.0 mg/dL  Basic metabolic panel  Result Value Ref Range   Sodium 139 135 - 145 mmol/L   Potassium 4.2 3.5 - 5.1 mmol/L   Chloride 106 101 - 111 mmol/L   CO2 24 22 - 32 mmol/L   Glucose, Bld 176 (H) 65 - 99 mg/dL   BUN 8 6 - 20 mg/dL   Creatinine, Ser 0.82 0.61 - 1.24 mg/dL   Calcium 9.4 8.9 - 10.3 mg/dL   GFR calc non Af Amer >60 >60 mL/min   GFR calc Af Amer >60 >60 mL/min   Anion gap 9 5 - 15  Basic metabolic panel  Result Value Ref Range   Sodium 141 135 - 145  mmol/L   Potassium 3.9 3.5 - 5.1 mmol/L   Chloride 110 101 - 111 mmol/L   CO2 22 22 - 32 mmol/L   Glucose, Bld 191 (H) 65 - 99 mg/dL   BUN 8 6 - 20 mg/dL   Creatinine, Ser 0.88 0.61 - 1.24 mg/dL   Calcium 8.8 (L) 8.9 - 10.3 mg/dL   GFR calc non Af Amer >60 >60 mL/min   GFR calc Af Amer >60 >60 mL/min   Anion gap 9 5 - 15  Pathologist smear review  Result Value Ref Range   Path Review Acute inflammation.   Vancomycin, trough  Result Value Ref Range   Vancomycin Tr 14 (L) 15 - 20 ug/mL  Basic metabolic panel  Result Value Ref Range   Sodium 141 135 - 145 mmol/L   Potassium 4.7 3.5 - 5.1 mmol/L   Chloride 110 101 - 111 mmol/L   CO2 24 22 - 32 mmol/L   Glucose, Bld 97 65 - 99 mg/dL   BUN 9 6 - 20 mg/dL   Creatinine, Ser 0.81 0.61 - 1.24 mg/dL   Calcium 8.1 (L) 8.9 - 10.3 mg/dL   GFR calc non Af Amer >60 >60 mL/min   GFR calc Af Amer >60 >60 mL/min   Anion gap 7 5 - 15  CBC with Differential/Platelet  Result Value Ref Range   WBC 11.8 (H) 4.0 - 10.5 K/uL   RBC 3.64 (L) 4.22 - 5.81 MIL/uL   Hemoglobin 11.0 (L) 13.0 - 17.0 g/dL   HCT 33.0 (L) 39.0 - 52.0 %   MCV 90.7 78.0 - 100.0 fL   MCH 30.2 26.0 - 34.0 pg   MCHC 33.3 30.0 - 36.0 g/dL   RDW 13.9 11.5 - 15.5 %   Platelets 201 150 - 400 K/uL   Neutrophils Relative % 69 %   Neutro Abs 8.2 (H) 1.7 - 7.7 K/uL   Lymphocytes Relative 23 %   Lymphs Abs 2.7 0.7 - 4.0 K/uL   Monocytes Relative 7 %   Monocytes Absolute 0.8 0.1 - 1.0 K/uL   Eosinophils Relative 1 %   Eosinophils Absolute 0.1 0.0 - 0.7 K/uL   Basophils Relative 0 %   Basophils Absolute 0.0 0.0 - 0.1 K/uL    Ct Angio Head W Or Wo Contrast  Result Date: 03/02/2016 CLINICAL DATA:  Sudden onset expressive aphasia and confusion. EXAM: CT ANGIOGRAPHY HEAD AND NECK TECHNIQUE: Multidetector CT imaging of the head and neck was performed using the standard protocol during bolus administration of intravenous contrast. Multiplanar CT image  reconstructions and MIPs were  obtained to evaluate the vascular anatomy. Carotid stenosis measurements (when applicable) are obtained utilizing NASCET criteria, using the distal internal carotid diameter as the denominator. CONTRAST:  50 mL Isovue 370 COMPARISON:  Noncontrast head CT earlier today FINDINGS: CTA NECK FINDINGS Aortic arch: 3 vessel aortic arch. Brachiocephalic and subclavian arteries are widely patent. Right carotid system: Patent without evidence of stenosis or dissection. Left carotid system: Patent without evidence of stenosis or dissection. Vertebral arteries: Patent without evidence of stenosis or dissection. Left vertebral artery is dominant. Skeleton: No acute osseous abnormality or suspicious osseous lesion. Other neck: No mass or lymph node enlargement. Upper chest: Unremarkable. Review of the MIP images confirms the above findings CTA HEAD FINDINGS Anterior circulation: Internal carotid arteries are widely patent from skullbase to carotid termini. The cavernous segments are tortuous, particularly on the right. ACAs and MCAs are patent without evidence of flow limiting proximal stenosis. MCA branch vessel evaluation is limited by venous contamination as well as the large left frontal mass which medially displaces the left MCA sylvian branches. No aneurysm. Posterior circulation: The intracranial vertebral arteries are patent to the basilar with mild irregularity but no significant stenosis. PICA, AICA, and SCA origins are patent. Basilar artery is widely patent. Posterior communicating arteries are diminutive or absent. PCAs are patent without evidence of significant stenosis. No aneurysm. Venous sinuses: Patent. Anatomic variants: None. Delayed phase: Mass centered in the left frontal operculum described on earlier CT demonstrates avid, largely homogeneous enhancement throughout aside from a small amount of hypoenhancement centrally and superiorly. Mass effect and midline shift with right lateral ventricular trapping as  described on recent noncontrast CT. No additional enhancing lesions identified. Review of the MIP images confirms the above findings IMPRESSION: 1. No large vessel occlusion or significant stenosis in the intracranial or cervical arterial circulation. 2. 6.6 cm left frontal mass most consistent with meningioma. Electronically Signed   By: Logan Bores M.D.   On: 03/02/2016 11:36   Dg Chest 2 View  Result Date: 03/20/2016 CLINICAL DATA:  51 year old male with fever. EXAM: CHEST  2 VIEW COMPARISON:  None. FINDINGS: There is shallow inspiration. The lungs are clear. No pleural effusion or pneumothorax. The cardiac silhouette is within normal limits. Right-sided PICC with tip over right atrial silhouette. No acute osseous pathology. IMPRESSION: No active cardiopulmonary disease. Right-sided PICC with tip over the right atrium. Electronically Signed   By: Anner Crete M.D.   On: 03/20/2016 05:06   Ct Head Wo Contrast  Result Date: 03/17/2016 CLINICAL DATA:  Left facial swelling. These results will be called to the ordering clinician or representative by the Radiology Department at the imaging location. EXAM: CT HEAD WITHOUT CONTRAST TECHNIQUE: Contiguous axial images were obtained from the base of the skull through the vertex without intravenous contrast. COMPARISON:  Brain MRI 03/04/2016 FINDINGS: Brain: Status post resection of left frontal convexity meningioma. Predominately fluid filled cavity is present in place of the tumor. Low-density regional frontal and superior temporal parenchyma correlating with cytotoxic edema on previous brain MRI. Swelling and midline shift is diminished, now 4 mm as opposed a 7 mm previously. No entrapment or acute infarct. No acute blood products. There is a fluid collection superficial to the craniotomy bone flap which measures 8 cm in diameter by 1.3 cm in thickness, likely CSF. The margins are enhancing and there is small volume gas; no unexpected soft tissue  inflammation. Subcutaneous edema tracks from the surgical site into the left mid face.  Vascular: No hyperdense vessel or unexpected calcification. Skull: Left frontal craniotomy with in place bone flap Sinuses/Orbits: Negative IMPRESSION: 1. Fluid collection superficial to the left frontal bone flap, favor leaked CSF. The collection measures up to 8 cm in diameter and 1.3 cm in thickness. 2. Unchanged intracranial findings of left frontal meningioma resection when compared to brain MRI 03/04/2016. Postoperative swelling and midline shift is improved. Electronically Signed   By: Monte Fantasia M.D.   On: 03/17/2016 13:02   Ct Head W & Wo Contrast  Result Date: 03/20/2016 CLINICAL DATA:  Fever of craniotomy. Elevated white count within the CSF. EXAM: CT HEAD WITHOUT AND WITH CONTRAST TECHNIQUE: Contiguous axial images were obtained from the base of the skull through the vertex without and with intravenous contrast CONTRAST:  14mL ISOVUE-300 IOPAMIDOL (ISOVUE-300) INJECTION 61% COMPARISON:  CT head without contrast 03/17/2016 and MRI brain 03/04/2016. FINDINGS: Brain: Hypoattenuation of the left temporal lobe and frontal operculum is similar the prior study. There is minimal extra-axial fluid subjacent the craniotomy site. Maximal dimensions on coronal imaging demonstrate slight reduction in size of the extracranial collection, now measuring 6.5 x 11 mm. The postcontrast images demonstrate enhancement about the surgical cavity with continued concern for infection. Midline shift is stable a 4 mm at the level of foramen of Monro. Vascular: No hyperdense vessel or unexpected calcification. Visible vessels are patent. Skull: Left frontal craniotomy is stable. No focal lytic or blastic lesions are present. There is no lysis about the craniotomy site. Sinuses/Orbits: Mild mucosal thickening is present within the maxillary sinuses and ethmoid air cells bilaterally. Mastoid air cells are clear. The globes and orbits are  within normal limits. IMPRESSION: 1. 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 All portions are postsurgical, of infection remains a concern. 2. Stable 4 mm midline shift 3. Minimal extra-axial fluid subjacent to the craniotomy. 4. Slight decrease in size of the extracranial fluid collection. 5. Mild sinus disease. Electronically Signed   By: San Morelle M.D.   On: 03/20/2016 12:09   Ct Angio Neck W Or Wo Contrast  Result Date: 03/02/2016 CLINICAL DATA:  Sudden onset expressive aphasia and confusion. EXAM: CT ANGIOGRAPHY HEAD AND NECK TECHNIQUE: Multidetector CT imaging of the head and neck was performed using the standard protocol during bolus administration of intravenous contrast. Multiplanar CT image reconstructions and MIPs were obtained to evaluate the vascular anatomy. Carotid stenosis measurements (when applicable) are obtained utilizing NASCET criteria, using the distal internal carotid diameter as the denominator. CONTRAST:  50 mL Isovue 370 COMPARISON:  Noncontrast head CT earlier today FINDINGS: CTA NECK FINDINGS Aortic arch: 3 vessel aortic arch. Brachiocephalic and subclavian arteries are widely patent. Right carotid system: Patent without evidence of stenosis or dissection. Left carotid system: Patent without evidence of stenosis or dissection. Vertebral arteries: Patent without evidence of stenosis or dissection. Left vertebral artery is dominant. Skeleton: No acute osseous abnormality or suspicious osseous lesion. Other neck: No mass or lymph node enlargement. Upper chest: Unremarkable. Review of the MIP images confirms the above findings CTA HEAD FINDINGS Anterior circulation: Internal carotid arteries are widely patent from skullbase to carotid termini. The cavernous segments are tortuous, particularly on the right. ACAs and MCAs are patent without evidence of flow limiting proximal stenosis. MCA branch  vessel evaluation is limited by venous contamination as well as the large left frontal mass which medially displaces the left MCA sylvian branches. No aneurysm. Posterior circulation: The intracranial vertebral arteries  are patent to the basilar with mild irregularity but no significant stenosis. PICA, AICA, and SCA origins are patent. Basilar artery is widely patent. Posterior communicating arteries are diminutive or absent. PCAs are patent without evidence of significant stenosis. No aneurysm. Venous sinuses: Patent. Anatomic variants: None. Delayed phase: Mass centered in the left frontal operculum described on earlier CT demonstrates avid, largely homogeneous enhancement throughout aside from a small amount of hypoenhancement centrally and superiorly. Mass effect and midline shift with right lateral ventricular trapping as described on recent noncontrast CT. No additional enhancing lesions identified. Review of the MIP images confirms the above findings IMPRESSION: 1. No large vessel occlusion or significant stenosis in the intracranial or cervical arterial circulation. 2. 6.6 cm left frontal mass most consistent with meningioma. Electronically Signed   By: Logan Bores M.D.   On: 03/02/2016 11:36   Mr Jeri Cos [id number] Contrast  Result Date: 03/04/2016 CLINICAL DATA:  Follow-up LEFT frontotemporal meningioma resection. EXAM: MRI HEAD WITHOUT AND WITH CONTRAST TECHNIQUE: Multiplanar, multiecho pulse sequences of the brain and surrounding structures were obtained without and with intravenous contrast. CONTRAST:  73mL MULTIHANCE GADOBENATE DIMEGLUMINE 529 MG/ML IV SOLN COMPARISON:  MRI of the head March 02, 2016 FINDINGS: INTRACRANIAL CONTENTS: LEFT frontotemporal extra-axial resection cavity with air fluid and fluid hematocrit levels expanding the LEFT sylvian fissure. Surrounding T2 bright FLAIR signal and confluent LEFT frontotemporal periresectional reduced diffusion and low ADC values. T1 shortening along the  medial margin of the resection cavity without enhancement to suggest residual tumor. Mild smooth dural enhancement consistent with recent surgery. Small amount of LEFT frontal extra-axial pneumocephalus without mass effect. Residual 7 mm of LEFT to RIGHT midline shift decreased from 9 mm. Persistent mass effect on the LEFT operculum. Partial re-expansion of LEFT lateral ventricle without RIGHT ventricular entrapment, no hydrocephalus. Basal cisterns are patent. VASCULAR: Normal major intracranial vascular flow voids present at skull base. SKULL AND UPPER CERVICAL SPINE: No abnormal sellar expansion. LEFT frontotemporal craniotomy. LEFT scalp soft tissue swelling and skin staples. Craniocervical junction maintained. SINUSES/ORBITS: The mastoid air-cells and included paranasal sinuses are well-aerated.The included ocular globes and orbital contents are non-suspicious. OTHER: None. IMPRESSION: Interval LEFT frontotemporal craniotomy for EN bloc resection of meningioma. Fluid and gas within the resection cavity, residual 7 mm, improved LEFT-to-RIGHT midline shift without ventricular entrapment. Cytotoxic edema LEFT fronto temporal lobe along the resection cavity margin. Electronically Signed   By: Elon Alas M.D.   On: 03/04/2016 16:25   Mr Brain W And Wo Contrast  Result Date: 03/02/2016 CLINICAL DATA:  Speech difficulties EXAM: MRI HEAD WITHOUT AND WITH CONTRAST TECHNIQUE: Multiplanar, multiecho pulse sequences of the brain and surrounding structures were obtained without and with intravenous contrast. CONTRAST:  63mL MULTIHANCE GADOBENATE DIMEGLUMINE 529 MG/ML IV SOLN COMPARISON:  CTA head neck 03/02/2016 FINDINGS: Brain: No focal diffusion restriction to indicate acute infarct. No intraparenchymal hemorrhage. There is a large dural-based mass arising from the left convexity measuring 6.1 cm AP by 5.9 cm CC x 4.2 cm TV. There is 10 mm of rightward midline shift at the level of the foramina of [place]. The  left lateral ventricle is compressed. There is minimal parenchymal vasogenic edema adjacent to the mass. No hydrocephalus or extra-axial fluid collection. The midline structures are normal. No age advanced or lobar predominant atrophy. Vascular: Major intracranial arterial and venous sinus flow voids are preserved. No evidence of chronic microhemorrhage or amyloid angiopathy. Skull and upper cervical spine: The visualized skull base, calvarium, upper cervical spine  and extracranial soft tissues are normal. Sinuses/Orbits: No fluid levels or advanced mucosal thickening. No mastoid effusion. Normal orbits. IMPRESSION: Large left convexity meningioma with mass effect on the underlying left frontal and temporal lobes. Minimal adjacent edema and no acute ischemia. Electronically Signed   By: Ulyses Jarred M.D.   On: 03/02/2016 20:08   Ct Head Code Stroke W/o Cm  Result Date: 03/02/2016 CLINICAL DATA:  Code stroke. Sudden onset expressive aphasia and confusion. EXAM: CT HEAD WITHOUT CONTRAST TECHNIQUE: Contiguous axial images were obtained from the base of the skull through the vertex without intravenous contrast. COMPARISON:  11/18/2012 FINDINGS: Brain: There is an approximately 7 x 5 cm mass centered in the region of the left frontal operculum and insula. There is a small amount of calcification along the lateral margin of the mass just deep to the skull with evidence of some mild osseous remodeling. There is mass effect on the left basal ganglia with 12 mm of rightward midline shift. There is partial effacement of the right lateral ventricle. There is also new mild dilatation of the right lateral ventricle. There is no evidence of acute cortical infarct, intracranial hemorrhage, or extra-axial fluid collection. Vascular: No hyperdense vessel or unexpected calcification. Skull: No evidence of fracture or focal osseous lesion. Sinuses/Orbits: No acute finding. Other: None. ASPECTS [facility] Stroke Program Early CT  Score): Not calculated due to the presence of a mass. IMPRESSION: 1. 7 cm left frontal mass most concerning for primary CNS neoplasm. Contrast-enhanced brain MRI recommended for further evaluation. 2. 12 mm midline shift. Mild trapping of the right lateral ventricle. The study was reviewed in person with Dr. Alver Fisher on 03/02/2016 at 10:51 a.m. Electronically Signed   By: Logan Bores M.D.   On: 03/02/2016 11:24    Antibiotics:  Anti-infectives    Start     Dose/Rate Route Frequency Ordered Stop   03/24/16 0000  vancomycin 1,250 mg in sodium chloride 0.9 % 250 mL     1,250 mg 166.7 mL/hr over 90 Minutes Intravenous Every 12 hours 03/24/16 1329     03/24/16 0000  cefTRIAXone 2 g in dextrose 5 % 50 mL     2 g 100 mL/hr over 30 Minutes Intravenous Every 12 hours 03/24/16 1329     03/23/16 0600  vancomycin (VANCOCIN) 1,250 mg in sodium chloride 0.9 % 250 mL IVPB     1,250 mg 166.7 mL/hr over 90 Minutes Intravenous Every 8 hours 03/22/16 2231     03/21/16 0859  vancomycin (VANCOCIN) powder  Status:  Discontinued       As needed 03/21/16 0859 03/21/16 0926   03/21/16 0821  bacitracin 50,000 Units in sodium chloride irrigation 0.9 % 500 mL irrigation  Status:  Discontinued       As needed 03/21/16 0821 03/21/16 0926   03/20/16 1330  vancomycin (VANCOCIN) IVPB 1000 mg/200 mL premix     1,000 mg 200 mL/hr over 60 Minutes Intravenous Every 8 hours 03/20/16 1302 03/22/16 2359   03/18/16 1100  vancomycin (VANCOCIN) IVPB 1000 mg/200 mL premix  Status:  Discontinued     1,000 mg 200 mL/hr over 60 Minutes Intravenous Every 12 hours 03/17/16 2226 03/20/16 1302   03/17/16 2300  cefTRIAXone (ROCEPHIN) 2 g in dextrose 5 % 50 mL IVPB     2 g 100 mL/hr over 30 Minutes Intravenous Every 12 hours 03/17/16 2226     03/17/16 2230  vancomycin (VANCOCIN) 2,500 mg in sodium chloride 0.9 % 500 mL IVPB  2,500 mg 250 mL/hr over 120 Minutes Intravenous  Once 03/17/16 2226 03/18/16 0336      Discharge Exam: Blood  pressure 127/72, pulse 73, temperature 97.9 F (36.6 C), temperature source Oral, resp. rate 16, height 5\' 9"  (1.753 m), weight 108.1 kg (238 lb 5.1 oz), SpO2 97 %. Neurologic: Grossly normal Incision clean dry and intact  Discharge Medications:   Allergies as of 03/24/2016   No Known Allergies     Medication List    STOP taking these medications   methylPREDNISolone 4 MG Tbpk tablet Commonly known as:  MEDROL     TAKE these medications   amLODipine 10 MG tablet Commonly known as:  NORVASC Take 10 mg by mouth daily.   aspirin EC 81 MG tablet Take 81 mg by mouth daily.   baclofen 10 MG tablet Commonly known as:  LIORESAL Take 10 mg by mouth 2 (two) times daily as needed for muscle spasms.   cefTRIAXone 2 g in dextrose 5 % 50 mL Inject 2 g into the vein every 12 (twelve) hours.   HYDROcodone-acetaminophen 5-325 MG tablet Commonly known as:  NORCO/VICODIN Take 1-2 tablets by mouth every 4 (four) hours as needed for moderate pain.   levETIRAcetam 500 MG tablet Commonly known as:  KEPPRA Take 1 tablet (500 mg total) by mouth 2 (two) times daily.   OMEGA-3 FISH OIL PO Take 1 capsule by mouth daily.   ondansetron 4 MG disintegrating tablet Commonly known as:  ZOFRAN ODT Take 1 tablet (4 mg total) by mouth every 8 (eight) hours as needed for nausea.   vancomycin 1,250 mg in sodium chloride 0.9 % 250 mL Inject 1,250 mg into the vein every 12 (twelve) hours.   vitamin B-12 100 MCG tablet Commonly known as:  CYANOCOBALAMIN Take 250 mcg by mouth daily.       Disposition: home   Final Dx: subgaleal fluid collection/ fever  Discharge Instructions    Call MD for:  difficulty breathing, headache or visual disturbances    Complete by:  As directed    Call MD for:  persistant nausea and vomiting    Complete by:  As directed    Call MD for:  redness, tenderness, or signs of infection (pain, swelling, redness, odor or green/yellow discharge around incision site)     Complete by:  As directed    Call MD for:  severe uncontrolled pain    Complete by:  As directed    Call MD for:  temperature >100.4    Complete by:  As directed    Diet - low sodium heart healthy    Complete by:  As directed    Increase activity slowly    Complete by:  As directed          Signed: Mykenna Viele S 03/24/2016, 1:29 PM

## 2016-03-24 NOTE — Progress Notes (Signed)
Physical Therapy Treatment and Discharge Patient Details Name: Kyle Chan MRN: [id number] DOB: [birth year] Today's Date: 03/24/2016    History of Present Illness Pt is a 51 y/o male with a PMH of frontotemporal craniotomy 2/8 for resection of meningioma. Pt returns to [facility] as a direct admit with fever and reports of tingling in L side of his face, SOB, and feeling of swelling in his face. Imaging showed subgaleal fluid collection. Pt is now s/p L frontal craniotomy exploration with I&D of cranial wound.    PT Comments    Pt progressing well with mobility. Pt met PT goals and is d/c from acute therapy services. Pt was able to complete gait and stair training this session and reports no concerns with managing functional mobility at home. Continues to demonstrate some word finding difficulties and decreased STM. Will sign off at this time. If needs change, please reconsult.   Follow Up Recommendations  No PT follow up     Equipment Recommendations  None recommended by PT    Recommendations for Other Services       Precautions / Restrictions Precautions Precautions: Fall Precaution Comments: subgaleal drain Restrictions Weight Bearing Restrictions: No    Mobility  Bed Mobility               General bed mobility comments: Pt sitting up on the couch in room when PT arrived.   Transfers Overall transfer level: Modified independent Equipment used: None Transfers: Sit to/from Stand              Ambulation/Gait Ambulation/Gait assistance: Modified independent (Device/Increase time) Ambulation Distance (Feet): 500 Feet Assistive device: None Gait Pattern/deviations: WFL(Within Functional Limits) Gait velocity: Slightly decreased Gait velocity interpretation: Below normal speed for age/gender General Gait Details: No unsteadiness or LOB noted.    Stairs Stairs: Yes   Stair Management: One rail Right;Alternating pattern;Forwards Number of Stairs: 8 (4 x2  (limited due to IV line)) General stair comments: No physical assist required. Light supervision for safety.   Wheelchair Mobility    Modified Rankin (Stroke Patients Only)       Balance Overall balance assessment: Needs assistance Sitting-balance support: Feet supported;No upper extremity supported Sitting balance-Leahy Scale: Good     Standing balance support: During functional activity;No upper extremity supported Standing balance-Leahy Scale: Good                      Cognition Arousal/Alertness: Awake/alert Behavior During Therapy: WFL for tasks assessed/performed Overall Cognitive Status: Impaired/Different from baseline Area of Impairment: Memory             Problem Solving: Slow processing General Comments: Difficulty with word finding    Exercises      General Comments        Pertinent Vitals/Pain Pain Assessment: No/denies pain    Home Living                      Prior Function            PT Goals (current goals can now be found in the care plan section) Acute Rehab PT Goals PT Goal Formulation: All assessment and education complete, DC therapy Time For Goal Achievement: 04/05/16 Potential to Achieve Goals: Good Progress towards PT goals: Goals met/education completed, patient discharged from PT    Frequency    Min 3X/week      PT Plan Current plan remains appropriate    Co-evaluation  End of Session Equipment Utilized During Treatment: Gait belt Activity Tolerance: Patient tolerated treatment well Patient left: in chair;with call bell/phone within reach;with family/visitor present Nurse Communication: Mobility status PT Visit Diagnosis: Other abnormalities of gait and mobility (R26.89)     Time: 1005-1018 PT Time Calculation (min) (ACUTE ONLY): 13 min  Charges:  $Gait Training: 8-22 mins                    G Codes:       Thelma Comp 2016/04/04, 2:49 PM  Rolinda Roan, PT, DPT Acute  Rehabilitation Services Pager: [phone]

## 2016-03-24 NOTE — Progress Notes (Signed)
PHARMACY CONSULT NOTE FOR:  OUTPATIENT  PARENTERAL ANTIBIOTIC THERAPY (OPAT)  Indication: Subgaleal fluid collection culture negative Regimen: ceftriaxone 2g IV q12h and vancomycin 1.5g IV q12h End date: 04/21/16  IV antibiotic discharge orders are pended. To discharging provider:  please sign these orders via discharge navigator,  Select New Orders & click on the button choice - Manage This Unsigned Work.     Thank you for allowing pharmacy to be a part of this patient's care.  Candie Mile 03/24/2016, 1:56 PM

## 2016-03-24 NOTE — Care Management Note (Signed)
Case Management Note  Patient Details  Name: Kyle Chan MRN: [id number] Date of Birth: [birth year]  Subjective/Objective:                    Action/Plan: Pt discharging [facility] with [facility] for home IV antibiotic therapy. Pam with [facility] IV therapy aware and will go over PICC line care and antibiotic administration with the family.  Pt's wife to provide transportation home.   Expected Discharge Date:  03/24/16               Expected Discharge Plan:  [place]  In-House Referral:     Discharge planning Services  CM Consult  Post Acute Care Choice:  Home Health Choice offered to:  Spouse, Patient  DME Arranged:    DME Agency:     HH Arranged:  RN, IV Antibiotics HH Agency:  [facility]  Status of Service:  Completed, signed off  If discussed at [place] of Stay Meetings, dates discussed:    Additional Comments:  Pollie Friar, RN 03/24/2016, 2:40 PM

## 2016-03-24 NOTE — Progress Notes (Signed)
Pt discharge education and instructions completed with pt and spouse at bedside; both denies any questions. Pt discharge home with spouse to transport him home. Pt head incision staples clean, dry and intact open to air. No drainage or active bleeding noted. Pt right PICC intact upon discharge. Pt transported off unit via wheelchair with spouse and belongings to the side. Delia Heady RN

## 2016-03-26 LAB — AEROBIC/ANAEROBIC CULTURE W GRAM STAIN (SURGICAL/DEEP WOUND): Culture: NO GROWTH

## 2016-03-26 LAB — ANAEROBIC CULTURE

## 2016-03-26 LAB — AEROBIC/ANAEROBIC CULTURE (SURGICAL/DEEP WOUND)

## 2016-03-27 LAB — CBC WITH DIFFERENTIAL/PLATELET
BASOS PCT: 0 %
Basophils Absolute: 0 10*3/uL (ref 0.0–0.1)
EOS ABS: 0.4 10*3/uL (ref 0.0–0.7)
EOS PCT: 4 %
HCT: 35.4 % — ABNORMAL LOW (ref 39.0–52.0)
Hemoglobin: 12.1 g/dL — ABNORMAL LOW (ref 13.0–17.0)
LYMPHS ABS: 2.6 10*3/uL (ref 0.7–4.0)
Lymphocytes Relative: 24 %
MCH: 30.4 pg (ref 26.0–34.0)
MCHC: 34.2 g/dL (ref 30.0–36.0)
MCV: 88.9 fL (ref 78.0–100.0)
MONOS PCT: 8 %
Monocytes Absolute: 0.8 10*3/uL (ref 0.1–1.0)
NEUTROS PCT: 64 %
Neutro Abs: 6.7 10*3/uL (ref 1.7–7.7)
PLATELETS: 280 10*3/uL (ref 150–400)
RBC: 3.98 MIL/uL — ABNORMAL LOW (ref 4.22–5.81)
RDW: 13.5 % (ref 11.5–15.5)
WBC: 10.5 10*3/uL (ref 4.0–10.5)

## 2016-03-27 LAB — CREATININE, SERUM
CREATININE: 0.88 mg/dL (ref 0.61–1.24)
GFR calc Af Amer: >60 mL/min (ref >60)

## 2016-03-27 LAB — BUN: BUN: 7 mg/dL (ref 6–20)

## 2016-03-27 LAB — VANCOMYCIN, TROUGH: Vancomycin Tr: 9 ug/mL — ABNORMAL LOW (ref 15–20)

## 2016-04-01 ENCOUNTER — Other Ambulatory Visit: Payer: Self-pay | Admitting: Family Medicine

## 2016-04-01 DIAGNOSIS — R221 Localized swelling, mass and lump, neck: Secondary | ICD-10-CM

## 2016-04-04 ENCOUNTER — Other Ambulatory Visit: Payer: Commercial Managed Care - HMO

## 2016-04-06 ENCOUNTER — Other Ambulatory Visit: Payer: Commercial Managed Care - HMO

## 2016-04-06 ENCOUNTER — Ambulatory Visit: Payer: Commercial Managed Care - HMO | Attending: Physician Assistant

## 2016-04-06 DIAGNOSIS — R4701 Aphasia: Secondary | ICD-10-CM | POA: Diagnosis present

## 2016-04-06 NOTE — Therapy (Signed)
[facility] [address] [place], [place], [address] Phone: [phone]   Fax:  [phone]  Speech Language Pathology Treatment  Patient Details  Name: Kyle Chan MRN: [id number] Date of Birth: 1965/01/31 Referring Provider: Antony Contras, MD  Encounter Date: 04/06/2016      End of Session - 04/06/16 0921    Visit Number 3   Number of Visits 25   Date for SLP Re-Evaluation 05/20/16   SLP Start Time 0807   SLP Stop Time  0850   SLP Time Calculation (min) 43 min   Activity Tolerance Patient tolerated treatment well      Past Medical History:  Diagnosis Date  . Hypertension     Past Surgical History:  Procedure Laterality Date  . CRANIOTOMY Left 03/03/2016   Procedure: LEFT CRANIOTOMY FOR BRAIN TUMOR;  Surgeon: Eustace Moore, MD;  Location: [place];  Service: Neurosurgery;  Laterality: Left;  . CRANIOTOMY Left 03/21/2016   Procedure: IRRIGATION AND DEBRIDMENT OF CRANIOTOMY  AND EXPLORATION OF WOUND;  Surgeon: Eustace Moore, MD;  Location: [place];  Service: Neurosurgery;  Laterality: Left;  IRRIGATION AND DEBRIDMENT OF CRANIOTOMY  AND EXPLORATION OF WOUND  . NASAL RECONSTRUCTION WITH SEPTAL REPAIR N/A 11/18/2012   Procedure: NASAL RECONSTRUCTION WITH SEPTAL REPAIR;  Surgeon: Melida Quitter, MD;  Location: [place];  Service: ENT;  Laterality: N/A;    There were no vitals filed for this visit.      Subjective Assessment - 04/06/16 0813    Subjective Pt req'd A from wife to tell SLP d/c date. Wife states language is better than prior to admittance into hospital for debridement.   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 Divergent naming in concrete simple categories req'd mod-max A usually. SLP explained the difference between apraxia and aphasia with example of pt's dog's name Nugget, as pt had multiple groping attempts at saying dog's name. SLP used written and verbal cues for all tasks in order to facilitate corect answers. SLP encouraged pt to use "I can's say it" vs. "I don't know it." to differentiate between apraxia and aphasia.      Assessment / Recommendations / Plan   Plan Continue with current plan of care     Progression Toward Goals   Progression toward goals Progressing toward goals          SLP Education - 04/06/16 0920    Education provided Yes   Education Details aphasia vs apraxia   Person(s) Educated Patient;Spouse   Methods Explanation   Comprehension Verbalized understanding          SLP Short Term Goals - 04/06/16 0926      SLP SHORT TERM GOAL #1   Title pt will demo understanding of spoken sentence-length material using superlatives, qualifiers (over, under, less, greater, etc), and prepositions with 85% and rare min A   Time 33   Period Weeks   Status On-going     SLP SHORT TERM GOAL #2  Title pt will complete cloze phrases/opposites with 95% success (functional responses) with self correction over three sessions   Time 3   Period Weeks   Status On-going     SLP SHORT TERM GOAL #3   Title pt will complete rote tasks (except counting) with 95% success with self correction   Time 3   Period Weeks   Status On-going     SLP SHORT TERM GOAL #4   Title pt will match word (f:4) to a picture 80% success   Time 3   Period Weeks   Status On-going     SLP SHORT TERM GOAL #5   Title pt will write simple functional phrases 80% with occasional min A   Time 3   Period Weeks   Status On-going          SLP Long Term Goals  - 04/06/16 4536      SLP LONG TERM GOAL #1   Title pt will write 2-3 sentence notes/emails with modified independence   Time 11   Period Weeks   Status On-going     SLP LONG TERM GOAL #2   Title pt will demo understanding of functional 2-3 sentence paragraphs (emails, etc) with rare min A    Time 11   Period Weeks   Status On-going     SLP LONG TERM GOAL #3   Title pt will use multimodal communication PRN when verbal communication breaks down   Time 11   Period Weeks   Status On-going     SLP LONG TERM GOAL #4   Title pt will demo [facility] speech/language in 5 minutes min-mod complex conversation   Time 11   Period Weeks   Status On-going     SLP LONG TERM GOAL #5   Title pt will demo [facility] auditory comprehension with modified independnence in 5 minutes min-mod complex conversation   Time 11   Period Weeks   Status On-going          Plan - 04/06/16 4680    Clinical Impression Statement Pt required mod A usually for divergent naming in simple concrete categories. Pt with rote/automatic speech 100% success, saying family names 100%. These are all improvements from last visit and SLP believes pt's language/speech skills are better than pre-hospitalization (d/c 03-24-16). Continue skilled ST to maximize communication. Pt agrees to consider OT eval as aphasia resolves.    Speech Therapy Frequency 2x / week   Treatment/Interventions Language facilitation;Environmental controls;Multimodal communcation approach;Internal/external aids;SLP instruction and feedback;Functional tasks   Potential to Achieve Goals Good   Potential Considerations Severity of impairments   Consulted and Agree with Plan of Care Patient      Patient will benefit from skilled therapeutic intervention in order to improve the following deficits and impairments:   Aphasia    Problem List Patient Active Problem List   Diagnosis Date Noted  . Fever 03/17/2016  . S/P craniotomy 03/03/2016  . Brain mass  03/02/2016    [facility] ,[place], CCC-SLP  04/06/2016, 9:28 AM  [facility] [address] [place], [place], [address] Phone: [phone]   Fax:  [phone]   Name: Kyle Chan MRN: [id number] Date of Birth: 04-28-1965

## 2016-04-08 ENCOUNTER — Ambulatory Visit: Payer: Commercial Managed Care - HMO

## 2016-04-11 ENCOUNTER — Ambulatory Visit: Payer: Commercial Managed Care - HMO | Admitting: Speech Pathology

## 2016-04-11 DIAGNOSIS — R4701 Aphasia: Secondary | ICD-10-CM | POA: Diagnosis not present

## 2016-04-11 NOTE — Therapy (Signed)
[facility] [address] [place], [place], [address] Phone: [phone]   Fax:  [phone]  Speech Language Pathology Treatment  Patient Details  Name: Kyle Chan MRN: [id number] Date of Birth: 20-Feb-1965 Referring Provider: Antony Contras, MD  Encounter Date: 04/11/2016      End of Session - 04/11/16 1654    Visit Number 4   Number of Visits 25   Date for SLP Re-Evaluation 05/20/16   SLP Start Time 0335   SLP Stop Time  0420   SLP Time Calculation (min) 45 min   Activity Tolerance Patient tolerated treatment well      Past Medical History:  Diagnosis Date  . Hypertension     Past Surgical History:  Procedure Laterality Date  . CRANIOTOMY Left 03/03/2016   Procedure: LEFT CRANIOTOMY FOR BRAIN TUMOR;  Surgeon: Eustace Moore, MD;  Location: [facility] [place] [facility];  Service: Neurosurgery;  Laterality: Left;  . CRANIOTOMY Left 03/21/2016   Procedure: IRRIGATION AND DEBRIDMENT OF CRANIOTOMY  AND EXPLORATION OF WOUND;  Surgeon: Eustace Moore, MD;  Location: [facility];  Service: Neurosurgery;  Laterality: Left;  IRRIGATION AND DEBRIDMENT OF CRANIOTOMY  AND EXPLORATION OF WOUND  . NASAL RECONSTRUCTION WITH SEPTAL REPAIR N/A 11/18/2012   Procedure: NASAL RECONSTRUCTION WITH SEPTAL REPAIR;  Surgeon: Melida Quitter, MD;  Location: [facility];  Service: ENT;  Laterality: N/A;    There were no vitals filed for this visit.      Subjective Assessment - 04/11/16 1539    Subjective "Pretty good"   Patient is accompained by: Family member   Currently in Pain? 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 Divergent naming in concrete simple categories with mod-max descriptive, gestural, drawn and written cues. Provided written and verbal education as well as demonstration of supportive conversation tips including slowing rate, reducing time pressure demands and strategies to cue patient for description. Generates opposites of increasing complexity with 75% accuracy, noted with perseveration x3, benefits from written cues to improve comprehension, reduce perseveration.     Assessment / Recommendations / Plan   Plan Continue with current plan of care     Progression Toward Goals   Progression toward goals Progressing toward goals          SLP Education - 04/11/16 1654    Education provided Yes   Education Details supportive conversation, communication strategies   Person(s) Educated Patient;Spouse   Methods Explanation   Comprehension Verbalized understanding;Need further instruction          SLP Short Term Goals - 04/11/16 1700      SLP SHORT TERM GOAL #1   Title pt will demo understanding of spoken sentence-length material using superlatives, qualifiers (over, under, less, greater, etc), and prepositions with 85% and rare min A   Time 3   Period Weeks   Status On-going     SLP SHORT TERM GOAL #2   Title pt will  complete cloze phrases/opposites with 95% success (functional responses) with self correction over three sessions   Time 3   Period Weeks   Status On-going     SLP SHORT TERM GOAL #3   Title pt will complete rote tasks (except counting) with 95% success with self correction   Time 3   Period Weeks   Status On-going     SLP SHORT TERM GOAL #4   Title pt will match word (f:4) to a picture 80% success   Time 3   Period Weeks   Status On-going     SLP SHORT TERM GOAL #5   Title pt will write simple functional phrases 80% with  occasional min A   Time 3   Period Weeks   Status On-going          SLP Long Term Goals - 04/11/16 1701      SLP LONG TERM GOAL #1   Title pt will write 2-3 sentence notes/emails with modified independence   Time 11   Period Weeks   Status On-going     SLP LONG TERM GOAL #2   Title pt will demo understanding of functional 2-3 sentence paragraphs (emails, etc) with rare min A    Time 11   Period Weeks   Status On-going     SLP LONG TERM GOAL #3   Title pt will use multimodal communication PRN when verbal communication breaks down   Time 11   Period Weeks   Status On-going     SLP LONG TERM GOAL #4   Title pt will demo [facility] speech/language in 5 minutes min-mod complex conversation   Time 11   Period Weeks   Status On-going     SLP LONG TERM GOAL #5   Title pt will demo [facility] auditory comprehension with modified independnence in 5 minutes min-mod complex conversation   Time 11   Period Weeks   Status On-going          Plan - 04/11/16 1700    Clinical Impression Statement Patient required mod A for divergent naming, opposites. Automatic speech continues to improve; wife reports he has been unable to name months of the year accurately though he did so today independently. Patient frequently required encouragement persist with communication strategies. Wife quickly steps in to provide cues when patient requires extended time or encounters word-finding difficulties. Continue skilled ST to maximize communication.    Speech Therapy Frequency 2x / week   Duration Other (comment)   Treatment/Interventions Language facilitation;Environmental controls;Multimodal communcation approach;Internal/external aids;SLP instruction and feedback;Functional tasks   Potential to Achieve Goals Good   Potential Considerations Severity of impairments   SLP Home Exercise Plan picture/description matching, generative naming   Consulted and Agree with Plan of Care Patient      Patient will  benefit from skilled therapeutic intervention in order to improve the following deficits and impairments:   Aphasia    Problem List Patient Active Problem List   Diagnosis Date Noted  . Fever 03/17/2016  . S/P craniotomy 03/03/2016  . Brain mass 03/02/2016   Deneise Lever, MS CF-SLP Speech-Language Pathologist  Aliene Altes 04/11/2016, [facility] [address] [place], [place], [address] Phone: [phone]   Fax:  [phone]   Name: Kyle Chan MRN: [id number] Date of Birth: 24-Jul-1965

## 2016-04-14 ENCOUNTER — Ambulatory Visit: Payer: Commercial Managed Care - HMO | Admitting: Speech Pathology

## 2016-04-14 DIAGNOSIS — R4701 Aphasia: Secondary | ICD-10-CM

## 2016-04-14 NOTE — Therapy (Signed)
[facility] [address] [place], [place], [address] Phone: [phone]   Fax:  [phone]  Speech Language Pathology Treatment  Patient Details  Name: Kyle Chan MRN: [id number] Date of Birth: May 28, 1965 Referring Provider: Antony Contras, MD  Encounter Date: 04/14/2016      End of Session - 04/14/16 1254    Visit Number 5   Number of Visits 25   Date for SLP Re-Evaluation 05/20/16   SLP Start Time 0805   SLP Stop Time  0845   SLP Time Calculation (min) 40 min   Activity Tolerance Patient tolerated treatment well      Past Medical History:  Diagnosis Date  . Hypertension     Past Surgical History:  Procedure Laterality Date  . CRANIOTOMY Left 03/03/2016   Procedure: LEFT CRANIOTOMY FOR BRAIN TUMOR;  Surgeon: Eustace Moore, MD;  Location: [facility];  Service: Neurosurgery;  Laterality: Left;  . CRANIOTOMY Left 03/21/2016   Procedure: IRRIGATION AND DEBRIDMENT OF CRANIOTOMY  AND EXPLORATION OF WOUND;  Surgeon: Eustace Moore, MD;  Location: [facility];  Service: Neurosurgery;  Laterality: Left;  IRRIGATION AND DEBRIDMENT OF CRANIOTOMY  AND EXPLORATION OF WOUND  . NASAL RECONSTRUCTION WITH SEPTAL REPAIR N/A 11/18/2012   Procedure: NASAL RECONSTRUCTION WITH SEPTAL REPAIR;  Surgeon: Melida Quitter, MD;  Location: [facility];  Service: ENT;  Laterality: N/A;    There were no vitals filed for this visit.      Subjective Assessment - 04/14/16 0807    Subjective "Okay" re: homework; wife speaks over "Tell the truth"   Patient is accompained by: Family member   Currently in Pain? 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 SLP engaged pt in similar task, selecting opposites from a field of 3. Wife noted to immediately prompt "What is it," multiple times when pt with brief pause or incorrect response. SLP provided education to patient and wife re: reducing time pressure and verbal stimulus to allow pt additional time for processing. Demonstrated these techniques; patient selects appropriate choice with 85% accuracy given additional time for processing and rare min cues to slow down, read choices aloud. In remaining opportunities, patient required mod-max cues for description, generating semantically related words. Targeted expressive language and multimodal communication;  SLP provided moderate cues to utilize drawing and description; patient accurately relayed information about what he ate for dinner the previous night.      Assessment / Recommendations / Plan   Plan Continue with current plan of care     Progression Toward Goals   Progression toward goals Progressing toward goals          SLP Education - 04/14/16 1254    Education provided Yes   Education Details reduce verbal cuing and time pressure   Person(s) Educated Patient;Spouse   Methods Explanation;Demonstration;Verbal cues   Comprehension Verbalized understanding;Need further instruction  SLP Short Term Goals - 04/14/16 1256      SLP SHORT TERM GOAL #1   Title pt will demo understanding of spoken sentence-length material using superlatives, qualifiers (over, under, less, greater, etc), and prepositions with 85% and rare min A   Time 2   Period Weeks   Status On-going     SLP SHORT TERM GOAL #2   Title pt will complete cloze phrases/opposites with 95%  success (functional responses) with self correction over three sessions   Time 2   Period Weeks   Status On-going     SLP SHORT TERM GOAL #3   Title pt will complete rote tasks (except counting) with 95% success with self correction   Time 2   Period Weeks   Status On-going     SLP SHORT TERM GOAL #4   Title pt will match word (f:4) to a picture 80% success   Baseline 3.22   Time 2   Period Weeks   Status On-going     SLP SHORT TERM GOAL #5   Title pt will write simple functional phrases 80% with occasional min A   Time 2   Period Weeks   Status On-going          SLP Long Term Goals - 04/14/16 1257      SLP LONG TERM GOAL #1   Title pt will write 2-3 sentence notes/emails with modified independence   Time 10   Period Weeks   Status On-going     SLP LONG TERM GOAL #2   Title pt will demo understanding of functional 2-3 sentence paragraphs (emails, etc) with rare min A    Time 10   Period Weeks   Status On-going     SLP LONG TERM GOAL #3   Title pt will use multimodal communication PRN when verbal communication breaks down   Time 10   Period Weeks   Status On-going     SLP LONG TERM GOAL #4   Title pt will demo [facility] speech/language in 5 minutes min-mod complex conversation   Time 10   Period Weeks   Status On-going     SLP LONG TERM GOAL #5   Title pt will demo [facility] auditory comprehension with modified independnence in 5 minutes min-mod complex conversation   Time 10   Period Weeks   Status On-going          Plan - 04/14/16 1255    Clinical Impression Statement Patient required min A for 85% accuracy identifying opposites. Patient frequently required encouragement to persist with communication strategies. Wife quickly steps in to provide cues when patient requires extended time or encounters word-finding difficulties. Continue skilled ST to maximize communication.    Speech Therapy Frequency 2x / week   Duration Other (comment)   Treatment/Interventions  Language facilitation;Environmental controls;Multimodal communcation approach;Internal/external aids;SLP instruction and feedback;Functional tasks   Potential to Achieve Goals Good   Potential Considerations Severity of impairments   SLP Home Exercise Plan generative naming, opposites   Consulted and Agree with Plan of Care Patient      Patient will benefit from skilled therapeutic intervention in order to improve the following deficits and impairments:   Aphasia    Problem List Patient Active Problem List   Diagnosis Date Noted  . Fever 03/17/2016  . S/P craniotomy 03/03/2016  . Brain mass 03/02/2016   Deneise Lever, MS CF-SLP Speech-Language Pathologist   Aliene Altes 04/14/2016, 12:59 PM  [facility]  [facility] [address] [place], [place], [address] Phone: [phone]   Fax:  [phone]   Name: Kyle Chan MRN: [id number] Date of Birth: 1965-08-02

## 2016-04-18 ENCOUNTER — Ambulatory Visit: Payer: Commercial Managed Care - HMO | Admitting: Speech Pathology

## 2016-04-19 LAB — FUNGUS CULTURE WITH STAIN

## 2016-04-19 LAB — FUNGAL ORGANISM REFLEX

## 2016-04-19 LAB — FUNGUS CULTURE RESULT

## 2016-04-21 ENCOUNTER — Ambulatory Visit: Payer: Commercial Managed Care - HMO | Admitting: Speech Pathology

## 2016-04-21 DIAGNOSIS — R4701 Aphasia: Secondary | ICD-10-CM

## 2016-04-21 NOTE — Therapy (Signed)
[facility] [address] [place], [place], [address] Phone: [phone]   Fax:  [phone]  Speech Language Pathology Treatment  Patient Details  Name: Kyle Chan MRN: [id number] Date of Birth: 11-Aug-1965 Referring Provider: Antony Contras, MD  Encounter Date: 04/21/2016      End of Session - 04/21/16 1719    Visit Number 6   Number of Visits 25   Date for SLP Re-Evaluation 05/20/16   SLP Start Time 2595   SLP Stop Time  6387   SLP Time Calculation (min) 48 min   Activity Tolerance Patient tolerated treatment well      Past Medical History:  Diagnosis Date  . Hypertension     Past Surgical History:  Procedure Laterality Date  . CRANIOTOMY Left 03/03/2016   Procedure: LEFT CRANIOTOMY FOR BRAIN TUMOR;  Surgeon: Eustace Moore, MD;  Location: [facility];  Service: Neurosurgery;  Laterality: Left;  . CRANIOTOMY Left 03/21/2016   Procedure: IRRIGATION AND DEBRIDMENT OF CRANIOTOMY  AND EXPLORATION OF WOUND;  Surgeon: Eustace Moore, MD;  Location: [facility];  Service: Neurosurgery;  Laterality: Left;  IRRIGATION AND DEBRIDMENT OF CRANIOTOMY  AND EXPLORATION OF WOUND  . NASAL RECONSTRUCTION WITH SEPTAL REPAIR N/A 11/18/2012   Procedure: NASAL RECONSTRUCTION WITH SEPTAL REPAIR;  Surgeon: Melida Quitter, MD;  Location: [facility];  Service: ENT;  Laterality: N/A;    There were no vitals filed for this visit.      Subjective Assessment - 04/21/16 1626    Subjective "It - was - hard. Opposites"   Patient is accompained by: Family member   Currently in Pain? 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 SLP reviewed these breakdown; pt able to relay details with min A. SLP demonstrated addressing these breakdowns with multimodal communication including gestures, writing and drawing. 1 and 2 step commands with qualifiers with 75% accuracy, min cues. Cloze sentences with 95% accuracy, pt self corrects errors. Multisentence reading comprehension 80% accuracy. Tasks assigned for home exercise.     Assessment / Recommendations / Plan   Plan Continue with current plan of care     Progression Toward Goals   Progression toward goals Progressing toward goals          SLP Education - 04/21/16 1719    Education provided Yes   Education Details multimodal strategies to repair communication breakdowns   Person(s) Educated Spouse;Patient   Methods Explanation;Demonstration;Verbal cues   Comprehension Verbalized understanding;Returned demonstration          SLP Short Term Goals - 04/21/16 1721      SLP SHORT TERM GOAL #1   Title pt will demo understanding of spoken sentence-length material using superlatives, qualifiers (over, under, less, greater, etc), and prepositions with 85% and rare min A   Time 2   Period Weeks   Status On-going     SLP SHORT TERM GOAL #2   Title pt will complete cloze phrases/opposites with 95% success (functional responses)  with self correction over three sessions   Baseline 3.29.18   Time 2   Period Weeks   Status On-going     SLP SHORT TERM GOAL #3   Title pt will complete rote tasks (except counting) with 95% success with self correction   Time 2   Period Weeks   Status On-going     SLP SHORT TERM GOAL #4   Title pt will match word (f:4) to a picture 80% success   Baseline 3.22   Time 2   Period Weeks   Status On-going     SLP SHORT TERM GOAL #5   Title pt will write simple functional phrases 80% with occasional min A   Time 2    Period Weeks   Status On-going          SLP Long Term Goals - 04/21/16 1723      SLP LONG TERM GOAL #1   Title pt will write 2-3 sentence notes/emails with modified independence   Time 10   Period Weeks   Status On-going     SLP LONG TERM GOAL #2   Title pt will demo understanding of functional 2-3 sentence paragraphs (emails, etc) with rare min A    Time 10   Period Weeks   Status On-going     SLP LONG TERM GOAL #3   Title pt will use multimodal communication PRN when verbal communication breaks down   Time 10   Period Weeks   Status On-going     SLP LONG TERM GOAL #4   Title pt will demo [facility] speech/language in 5 minutes min-mod complex conversation   Time 10   Period Weeks   Status On-going     SLP LONG TERM GOAL #5   Title pt will demo [facility] auditory comprehension with modified independnence in 5 minutes min-mod complex conversation   Time 10   Period Weeks   Status On-going          Plan - 04/21/16 1719    Clinical Impression Statement Patient continues to display expressive and receptive language deficits. Speech with noted improved fluidity today; wife states she has noticed improvement as well. Patient is noted to utilize description in several instances vs abandoning his message when he encounters word finding difficulties. Continue skilled ST to maximize communication.    Speech Therapy Frequency 2x / week   Duration Other (comment)   Treatment/Interventions Language facilitation;Environmental controls;Multimodal communcation approach;Internal/external aids;SLP instruction and feedback;Functional tasks   Potential to Achieve Goals Good   Potential Considerations Severity of impairments   SLP Home Exercise Plan sentence completion, reading comprehension   Consulted and Agree with Plan of Care Patient      Patient will benefit from skilled therapeutic intervention in order to improve the following deficits and impairments:   Aphasia    Problem  List Patient Active Problem List   Diagnosis Date Noted  . Fever 03/17/2016  . S/P craniotomy 03/03/2016  . Brain mass 03/02/2016   Deneise Lever, MS CF-SLP Speech-Language Pathologist   Aliene Altes 04/21/2016, 5:24 PM  [facility] [address] [place], [place], [address] Phone: [phone]   Fax:  [phone]   Name: Kyle Chan MRN: [id number] Date of Birth: [birth year]

## 2016-04-28 ENCOUNTER — Ambulatory Visit: Payer: Commercial Managed Care - HMO | Attending: Physician Assistant | Admitting: Speech Pathology

## 2016-04-28 DIAGNOSIS — R4701 Aphasia: Secondary | ICD-10-CM

## 2016-04-28 NOTE — Therapy (Signed)
[facility] [address] [place], [place], [address] Phone: [phone]   Fax:  [phone]  Speech Language Pathology Treatment  Patient Details  Name: Kyle Chan MRN: [id number] Date of Birth: Apr 28, 1965 Referring Provider: Antony Contras, MD  Encounter Date: 04/28/2016      End of Session - 04/28/16 1231    Visit Number 7   Number of Visits 25   Date for SLP Re-Evaluation 05/20/16   SLP Start Time 0801   SLP Stop Time  0845   SLP Time Calculation (min) 44 min   Activity Tolerance Patient tolerated treatment well      Past Medical History:  Diagnosis Date  . Hypertension     Past Surgical History:  Procedure Laterality Date  . CRANIOTOMY Left 03/03/2016   Procedure: LEFT CRANIOTOMY FOR BRAIN TUMOR;  Surgeon: Eustace Moore, MD;  Location: [facility];  Service: Neurosurgery;  Laterality: Left;  . CRANIOTOMY Left 03/21/2016   Procedure: IRRIGATION AND DEBRIDMENT OF CRANIOTOMY  AND EXPLORATION OF WOUND;  Surgeon: Eustace Moore, MD;  Location: [facility];  Service: Neurosurgery;  Laterality: Left;  IRRIGATION AND DEBRIDMENT OF CRANIOTOMY  AND EXPLORATION OF WOUND  . NASAL RECONSTRUCTION WITH SEPTAL REPAIR N/A 11/18/2012   Procedure: NASAL RECONSTRUCTION WITH SEPTAL REPAIR;  Surgeon: Melida Quitter, MD;  Location: [facility];  Service: ENT;  Laterality: N/A;    There were no vitals filed for this visit.      Subjective Assessment - 04/28/16 0802    Subjective "It was good. We had a family -- meeting -- in a park."   Patient is accompained by: Family member   Currently in Pain? 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 Automatic speech months, days of the week, 100% accuracy. ABC's - 80% accuracy, mod cues for writing. SLP facilitated moderately complex sentence completion; pt with 85% accuracy from field of 3 with min-mod cues to slow down, re-read to check for comprehension errors. Several perseverations noted, patient self corrects in ~75% of opportunities. Provided training in sentence level writing task; pt writes 6-10 word sentences to describe photographs with mod cues for wordfinding, correcting paraphasias (letter vs ladder), spelling.     Assessment / Recommendations / Plan   Plan Continue with current plan of care     Progression Toward Goals   Progression toward goals Progressing toward goals          SLP Education - 04/28/16 1229    Education provided Yes   Education Details reduce rate for improved reading comprehension   Person(s) Educated Patient   Methods Explanation;Demonstration   Comprehension Verbalized understanding;Returned demonstration          SLP Short Term Goals - 04/28/16 1231      SLP SHORT TERM GOAL #1   Title pt will demo understanding of spoken sentence-length material using superlatives, qualifiers (over, under, less, greater, etc), and prepositions with 85% and rare min A  Time 1   Period Weeks   Status On-going     SLP SHORT TERM GOAL #2   Title pt will complete cloze phrases/opposites with 95% success (functional responses) with self correction over three sessions   Baseline 3.29.18, 04/25/16, 04/28/16   Time 1   Period Weeks   Status Achieved     SLP SHORT TERM GOAL #3   Title pt will complete rote tasks (except counting) with 95% success with self correction   Time 1   Period Weeks   Status Partially Met     SLP SHORT TERM GOAL #4   Title pt will match word (f:4) to a picture 80% success   Baseline  3.22, 04/28/16   Time 1   Period Weeks   Status Achieved          SLP Long Term Goals - 04/28/16 1234      SLP LONG TERM GOAL #1   Title pt will write 2-3 sentence notes/emails with modified independence   Time 9   Period Weeks   Status On-going     SLP LONG TERM GOAL #2   Title pt will demo understanding of functional 2-3 sentence paragraphs (emails, etc) with rare min A    Time 9   Period Weeks   Status On-going     SLP LONG TERM GOAL #3   Title pt will use multimodal communication PRN when verbal communication breaks down   Time 9   Period Weeks   Status On-going     SLP LONG TERM GOAL #4   Title pt will demo [facility] speech/language in 5 minutes min-mod complex conversation   Time 9   Period Weeks   Status On-going     SLP LONG TERM GOAL #5   Title pt will demo [facility] auditory comprehension with modified independnence in 5 minutes min-mod complex conversation   Time 9   Period Weeks   Status On-going          Plan - 04/28/16 1231    Clinical Impression Statement Patient continues to display expressive and receptive language deficits. Speech with noted improved fluidity today; wife states she has noticed improvement as well. Patient is noted to utilize description in several instances vs abandoning his message when he encounters word finding difficulties. Continue skilled ST to maximize communication.    Speech Therapy Frequency 2x / week   Duration Other (comment)   Treatment/Interventions Language facilitation;Environmental controls;Multimodal communcation approach;Internal/external aids;SLP instruction and feedback;Functional tasks   Potential to Achieve Goals Good   Potential Considerations Severity of impairments   SLP Home Exercise Plan sentence completion, reading comprehension   Consulted and Agree with Plan of Care Patient      Patient will benefit from skilled therapeutic intervention in order to improve the following deficits and impairments:    Aphasia    Problem List Patient Active Problem List   Diagnosis Date Noted  . Fever 03/17/2016  . S/P craniotomy 03/03/2016  . Brain mass 03/02/2016   Deneise Lever, MS CF-SLP Speech-Language Pathologist  Aliene Altes 04/28/2016, 12:35 PM  [facility] [address] [place], [place], [address] Phone: [phone]   Fax:  [phone]   Name: Kyle Chan MRN: [id number] Date of Birth: 03-03-1965

## 2016-05-03 ENCOUNTER — Ambulatory Visit: Payer: Commercial Managed Care - HMO | Admitting: Speech Pathology

## 2016-05-03 DIAGNOSIS — R4701 Aphasia: Secondary | ICD-10-CM | POA: Diagnosis not present

## 2016-05-03 NOTE — Therapy (Signed)
[facility] [address] [place], [place], [address] Phone: [phone]   Fax:  [phone]  Speech Language Pathology Treatment  Patient Details  Name: Kyle Chan MRN: [id number] Date of Birth: 1965/02/01 Referring Provider: Antony Contras, MD  Encounter Date: 05/03/2016      End of Session - 05/03/16 1226    SLP Start Time 0844   SLP Stop Time  0930   SLP Time Calculation (min) 46 min   Activity Tolerance Patient tolerated treatment well      Past Medical History:  Diagnosis Date  . Hypertension     Past Surgical History:  Procedure Laterality Date  . CRANIOTOMY Left 03/03/2016   Procedure: LEFT CRANIOTOMY FOR BRAIN TUMOR;  Surgeon: Eustace Moore, MD;  Location: [facility];  Service: Neurosurgery;  Laterality: Left;  . CRANIOTOMY Left 03/21/2016   Procedure: IRRIGATION AND DEBRIDMENT OF CRANIOTOMY  AND EXPLORATION OF WOUND;  Surgeon: Eustace Moore, MD;  Location: [facility];  Service: Neurosurgery;  Laterality: Left;  IRRIGATION AND DEBRIDMENT OF CRANIOTOMY  AND EXPLORATION OF WOUND  . NASAL RECONSTRUCTION WITH SEPTAL REPAIR N/A 11/18/2012   Procedure: NASAL RECONSTRUCTION WITH SEPTAL REPAIR;  Surgeon: Melida Quitter, MD;  Location: [facility];  Service: ENT;  Laterality: N/A;    There were no vitals filed for this visit.      Subjective Assessment - 05/03/16 0852    Patient is accompained by: Family member   Currently in Pain? 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 Written sentences describing pictures with extended time and occasional min questioning cues to generate description. Pt ID'd and self corrected errors with rare min A.      Assessment / Recommendations / Plan   Plan Continue with current plan of care     Progression Toward Goals   Progression toward goals Progressing toward goals          SLP Education - 05/03/16 0935    Education provided Yes   Education Details word finding is not the same as memory or intellect (to spouse)   Person(s) Educated Patient;Spouse   Methods Explanation;Demonstration   Comprehension Verbalized understanding;Need further instruction          SLP Short Term Goals - 05/03/16 0937      SLP SHORT TERM GOAL #1   Title pt will demo understanding of spoken sentence-length material using superlatives, qualifiers (over, under, less, greater, etc), and prepositions with 85% and rare min A   Time 1   Period Weeks   Status On-going     SLP SHORT TERM GOAL #2   Title pt will complete cloze phrases/opposites with 95% success (functional responses) with self correction over three sessions   Baseline 3.29.18, 04/25/16, 04/28/16   Time 1   Period Weeks   Status Achieved     SLP SHORT TERM GOAL #3   Title pt will complete rote tasks (except counting) with 95% success with self correction   Time  1   Period Weeks   Status Partially Met     SLP SHORT TERM GOAL #4   Title pt will match word (f:4) to a picture 80% success   Baseline 3.22, 04/28/16   Time 1   Period Weeks   Status Achieved          SLP Long Term Goals - 05/03/16 9198      SLP LONG TERM GOAL #1   Title pt will write 2-3 sentence notes/emails with modified independence   Time 8   Period Weeks   Status On-going     SLP LONG TERM GOAL #2   Title pt will demo understanding of functional 2-3 sentence paragraphs (emails, etc) with rare min A    Time 8   Period Weeks   Status On-going     SLP LONG TERM GOAL #3   Title pt  will use multimodal communication PRN when verbal communication breaks down   Time 8   Period Weeks   Status On-going     SLP LONG TERM GOAL #4   Title pt will demo [facility] speech/language in 5 minutes min-mod complex conversation   Time 8   Period Weeks   Status On-going     SLP LONG TERM GOAL #5   Title pt will demo [facility] auditory comprehension with modified independnence in 5 minutes min-mod complex conversation   Time 8   Period Weeks   Status On-going          Plan - 05/03/16 0936    Clinical Impression Statement Pt continues to require usual min to mod A for word finding, written expression at sentence level. Continue skilled ST to maximize communication for independence.    Speech Therapy Frequency 2x / week   Treatment/Interventions Language facilitation;Environmental controls;Multimodal communcation approach;Internal/external aids;SLP instruction and feedback;Functional tasks   Potential to Achieve Goals Good   Potential Considerations Severity of impairments   Consulted and Agree with Plan of Care Patient      Patient will benefit from skilled therapeutic intervention in order to improve the following deficits and impairments:   Aphasia    Problem List Patient Active Problem List   Diagnosis Date Noted  . Fever 03/17/2016  . S/P craniotomy 03/03/2016  . Brain mass 03/02/2016    Lovvorn, Annye Rusk MS, CCC-SLP 05/03/2016, 12:27 PM  [facility] [address] [place], [place], [address] Phone: [phone]   Fax:  [phone]   Name: Kyle Chan MRN: [id number] Date of Birth: [birth year]

## 2016-05-05 ENCOUNTER — Ambulatory Visit: Payer: Commercial Managed Care - HMO | Admitting: Speech Pathology

## 2016-05-05 DIAGNOSIS — R4701 Aphasia: Secondary | ICD-10-CM

## 2016-05-05 NOTE — Therapy (Signed)
[facility] [address] [place], [place], [address] Phone: [phone]   Fax:  [phone]  Speech Language Pathology Treatment  Patient Details  Name: Kyle Chan MRN: [id number] Date of Birth: 08-02-1965 Referring Provider: Antony Contras, MD  Encounter Date: 05/05/2016      End of Session - 05/05/16 1329    Visit Number 9   Number of Visits 25   Date for SLP Re-Evaluation 05/20/16   SLP Start Time 0847   SLP Stop Time  0931   SLP Time Calculation (min) 44 min   Activity Tolerance Patient tolerated treatment well      Past Medical History:  Diagnosis Date  . Hypertension     Past Surgical History:  Procedure Laterality Date  . CRANIOTOMY Left 03/03/2016   Procedure: LEFT CRANIOTOMY FOR BRAIN TUMOR;  Surgeon: Eustace Moore, MD;  Location: [facility];  Service: Neurosurgery;  Laterality: Left;  . CRANIOTOMY Left 03/21/2016   Procedure: IRRIGATION AND DEBRIDMENT OF CRANIOTOMY  AND EXPLORATION OF WOUND;  Surgeon: Eustace Moore, MD;  Location: [facility];  Service: Neurosurgery;  Laterality: Left;  IRRIGATION AND DEBRIDMENT OF CRANIOTOMY  AND EXPLORATION OF WOUND  . NASAL RECONSTRUCTION WITH SEPTAL REPAIR N/A 11/18/2012   Procedure: NASAL RECONSTRUCTION WITH SEPTAL REPAIR;  Surgeon: Melida Quitter, MD;  Location: [facility];  Service: ENT;  Laterality: N/A;    There were no vitals filed for this visit.      Subjective Assessment - 05/05/16 0859    Subjective "I ordered and it didn't go wll"   Patient is accompained by: Family member   Special Tests wife   Currently in Pain? 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 Functional reading comprehension reading and answering questions re: directions with map, town directory with occasional min A to ID reading errors and for comprehension.      Assessment / Recommendations / Plan   Plan Continue with current plan of care            SLP Short Term Goals - 05/05/16 1328      SLP SHORT TERM GOAL #1   Title pt will demo understanding of spoken sentence-length material using superlatives, qualifiers (over, under, less, greater, etc), and prepositions with 85% and rare min A   Time 1   Period Weeks   Status On-going     SLP SHORT TERM GOAL #2   Title pt will complete cloze phrases/opposites with 95% success (functional responses) with self correction over three sessions   Baseline 3.29.18, 04/25/16, 04/28/16   Time 1   Period Weeks   Status Achieved     SLP SHORT TERM GOAL #3   Title pt will complete rote tasks (except counting) with 95% success with self correction   Time 1   Period Weeks   Status Partially Met     SLP SHORT TERM GOAL #4   Title pt will match word (f:4) to a picture 80% success   Baseline 3.22, 04/28/16  Time 1   Period Weeks   Status Achieved     SLP SHORT TERM GOAL #5   Time 1          SLP Long Term Goals - 05/05/16 1328      SLP LONG TERM GOAL #1   Title pt will write 2-3 sentence notes/emails with modified independence   Time 8   Period Weeks   Status On-going     SLP LONG TERM GOAL #2   Title pt will demo understanding of functional 2-3 sentence paragraphs (emails, etc) with rare min A    Time 8   Period Weeks   Status On-going     SLP LONG TERM GOAL #3   Title pt will use multimodal communication PRN when verbal communication breaks down   Time 8   Period Weeks   Status On-going     SLP LONG TERM GOAL #4   Title pt will demo [facility] speech/language in 5 minutes  min-mod complex conversation   Time 8   Period Weeks   Status On-going     SLP LONG TERM GOAL #5   Title pt will demo [facility] auditory comprehension with modified independnence in 5 minutes min-mod complex conversation   Time 8   Period Weeks   Status On-going          Plan - 05/05/16 1325    Clinical Impression Statement Mr. Edmonds utilizes compensations for word finding difficulty with questioning cues. Phonemic pararphasias ID'd and corrected with occasional min A. Reading errors also require occasional to usual min A to ID error. Continue skilled ST to maximize communication for improved independence.    Speech Therapy Frequency 2x / week   Duration Other (comment)   Treatment/Interventions Language facilitation;Environmental controls;Multimodal communcation approach;Internal/external aids;SLP instruction and feedback;Functional tasks   Potential to Achieve Goals Good   Potential Considerations Severity of impairments      Patient will benefit from skilled therapeutic intervention in order to improve the following deficits and impairments:   Aphasia    Problem List Patient Active Problem List   Diagnosis Date Noted  . Fever 03/17/2016  . S/P craniotomy 03/03/2016  . Brain mass 03/02/2016    Orvel Cutsforth, Annye Rusk  MS, CCC-SLP 05/05/2016, 1:30 PM  [facility] [address] [place], [place], [address] Phone: [phone]   Fax:  [phone]   Name: Kyle Chan MRN: [id number] Date of Birth: [birth year]

## 2016-05-06 ENCOUNTER — Encounter: Payer: Commercial Managed Care - HMO | Admitting: *Deleted

## 2016-05-10 ENCOUNTER — Encounter: Payer: Commercial Managed Care - HMO | Admitting: Speech Pathology

## 2016-05-12 ENCOUNTER — Ambulatory Visit: Payer: Commercial Managed Care - HMO | Admitting: Speech Pathology

## 2016-06-01 ENCOUNTER — Encounter (HOSPITAL_COMMUNITY)
Admission: RE | Admit: 2016-06-01 | Discharge: 2016-06-01 | Disposition: A | Discharge status: Home / Self Care | Payer: Commercial Managed Care - HMO | Source: Ambulatory Visit | Attending: Neurosurgery | Admitting: Neurosurgery

## 2016-06-01 ENCOUNTER — Other Ambulatory Visit: Payer: Self-pay | Admitting: Neurosurgery

## 2016-06-01 DIAGNOSIS — G919 Hydrocephalus, unspecified: Secondary | ICD-10-CM | POA: Insufficient documentation

## 2016-06-01 LAB — CSF CELL COUNT WITH DIFFERENTIAL
Lymphs, CSF: 51 % (ref 40–80)
Monocyte-Macrophage-Spinal Fluid: 38 % (ref 15–45)
RBC COUNT CSF: 188 /mm3 — AB
Segmented Neutrophils-CSF: 11 % — ABNORMAL HIGH (ref 0–6)
TUBE #: 3
WBC, CSF: 47 /mm3 (ref 0–5)

## 2016-06-01 LAB — PROTEIN AND GLUCOSE, CSF
Glucose, CSF: 65 mg/dL (ref 40–70)
TOTAL PROTEIN, CSF: 445 mg/dL — AB (ref 15–45)

## 2016-06-02 LAB — PATHOLOGIST SMEAR REVIEW

## 2016-06-05 LAB — CSF CULTURE W GRAM STAIN

## 2016-06-05 LAB — CSF CULTURE: CULTURE: NO GROWTH

## 2016-06-17 ENCOUNTER — Other Ambulatory Visit: Payer: Self-pay | Admitting: Urology

## 2016-06-24 NOTE — Addendum Note (Signed)
Addendum  created 06/24/16 1011 by Effie Berkshire, MD   Sign clinical note

## 2016-07-04 ENCOUNTER — Encounter (HOSPITAL_BASED_OUTPATIENT_CLINIC_OR_DEPARTMENT_OTHER): Payer: Self-pay | Admitting: *Deleted

## 2016-07-04 NOTE — Progress Notes (Signed)
SPOKE W/ PT'S WIFE.  PT HAS EXPRESSIVE APHASIA (CONFUSION), WILL NEED HER IN PRE-OP.  NPO AFTER MN.  ARRIVE AT 0600.  NEEDS ISTAT 8.  CURRENT EKG IN CHART AND EPIC.  WILL TAKE NORVASC AND COZAAR AM DOS W/ SIPS OF WATER.

## 2016-07-10 NOTE — H&P (Signed)
HPI: Kyle Chan is a 51 year-old male with a penile lesion.  He has had the symptom(s) for 3 years. His lesion is not painful. He has not had to use creams on the lesion. He has not undergone surgery for treatment. He does not have a split stream when he urinates.   He is not on new medications. He is currently in a monogamous relationship.   06/14/16: He reports that he has had an area on the shaft of his penis that has been present for 2-3 years. It has increased in size. It causes no pain. He has no other lesions similar to this anywhere on his body. He has no history of warts. He has not seen any spraying or splitting of his urinary stream.  His penile lesions would be considered of moderate severity with no modifying factors or associated signs and symptoms.     CC: BPH New Patient  HPI: His symptoms began approximately 05/25/2010. The patient complains of lower urinary tract symptom(s) that include hesitancy, intermittency, and nocturia. The patient states his most bothersome symptom(s) are the following: nocturia.   He does not have frequency. He does not have urgency. He has nocturia 3-4 times per night. He does have to wait a long time to start his urinary stream. His urinary stream does start and stop during voiding. He feels that he does empty his bladder.   Patient states he has not had urinary retention in the past.   06/14/16: He does have some frequency but is mainly bothered with nocturia, some hesitancy and some intermittency that has been present and progressively worsening over the past 5-6 years. He has not tried any medication for it.     ALLERGIES: None   MEDICATIONS: Amlodipine Besylate  Losartan Potassium     GU PSH: None   NON-GU PSH: Brain surgery Nose Surgery (Unspecified)    GU PMH: None   NON-GU PMH: Hypertension    FAMILY HISTORY: None   SOCIAL HISTORY: Marital Status: Married Current Smoking Status: Patient has never smoked.   Tobacco Use  Assessment Completed: Used Tobacco in last 30 days? Has never drank.  Drinks 3 caffeinated drinks per day.    REVIEW OF SYSTEMS:    GU Review Male:   Patient reports frequent urination, get up at night to urinate, stream starts and stops, and trouble starting your stream. Patient denies burning/ pain with urination, leakage of urine, have to strain to urinate , erection problems, and penile pain.  Gastrointestinal (Upper):   Patient denies nausea, vomiting, and indigestion/ heartburn.  Gastrointestinal (Lower):   Patient denies diarrhea and constipation.  Constitutional:   Patient denies fever, night sweats, weight loss, and fatigue.  Skin:   Patient denies skin rash/ lesion and itching.  Eyes:   Patient denies blurred vision and double vision.  Ears/ Nose/ Throat:   Patient denies sore throat and sinus problems.  Hematologic/Lymphatic:   Patient denies swollen glands and easy bruising.  Cardiovascular:   Patient denies leg swelling and chest pains.  Respiratory:   Patient denies cough and shortness of breath.  Endocrine:   Patient denies excessive thirst.  Musculoskeletal:   Patient denies back pain and joint pain.  Neurological:   Patient denies headaches and dizziness.  Psychologic:   Patient denies depression and anxiety.   VITAL SIGNS:   Weight 231 lb / 104.78 kg  Height 67 in / 170.18 cm  BP 150/110 mmHg  Pulse 114 /min  BMI 36.2 kg/m  GU PHYSICAL EXAMINATION:    Scrotum: No lesions. No edema. No cysts. No warts.  Epididymides: Right: no spermatocele, no masses, no cysts, no tenderness, no induration, no enlargement. Left: no spermatocele, no masses, no cysts, no tenderness, no induration, no enlargement.  Testes: No tenderness, no swelling, no enlargement left testes. No tenderness, no swelling, no enlargement right testes. Normal location left testes. Normal location right testes. No mass, no cyst, no varicocele, no hydrocele left testes. No mass, no cyst, no varicocele, no  hydrocele right testes.  Urethral Meatus: Normal size. No lesion, no wart, no discharge, no polyp. Normal location.  Penis: Circumcised, there are 2 lesions on his penis at about the mid shaft and at approximately the 10 o'clock position. The larger of the 2 measures approximately 2 cm and has some depigmentation. There is some smooth and some rough areas. It is raised. It is nontender. There is a second lesion that measures approximately 7 mm adjacent to this and does have the appearance more of a typical condyloma.   MULTI-SYSTEM PHYSICAL EXAMINATION:    Constitutional: Well-nourished. No physical deformities. Normally developed. Good grooming.  Neck: Neck symmetrical, not swollen. Normal tracheal position.  Respiratory: No labored breathing, no use of accessory muscles.   Cardiovascular: Normal temperature, normal extremity pulses, no swelling, no varicosities.  Lymphatic: No enlargement of neck, axillae, groin.  Skin: No paleness, no jaundice, no cyanosis. No lesion, no ulcer, no rash.  Neurologic / Psychiatric: Oriented to time, oriented to place, oriented to person. No depression, no anxiety, no agitation.  Gastrointestinal: No mass, no tenderness, no rigidity, non obese abdomen.  Eyes: Normal conjunctivae. Normal eyelids.  Ears, Nose, Mouth, and Throat: Left ear no scars, no lesions, no masses. Right ear no scars, no lesions, no masses. Nose no scars, no lesions, no masses. Normal hearing. Normal lips.  Musculoskeletal: Normal gait and station of head and neck.     PAST DATA REVIEWED:  Source Of History:  Patient, Medical Record Request  Lab Test Review:   PSA, BUN/Creatinine  Records Review:   Previous Doctor Records, POC Tool  Notes:                     A PSA was done in 11/16 and was normal at 1.88. His creatinine in 3/18 was 0.88.   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 I did not recommend cryotherapy as I would not have pathology so I have recommended excisional biopsy. I went over the procedure in detail including the incision used, the risks and, patient's, the alternatives, the outpatient nature of the procedure as well as the probability of success and the anticipated postoperative course. He understands and has elected to proceed with this.  2   BPH w/LUTS - N40.1 While he does have some BPH by exam he does have moderate voiding symptoms.  3   Nocturia - R35.1 He has primarily obstructive voiding symptoms with nocturia 3-4 times,  hesitancy, intermittency but also has some frequency. We discussed a trial of alpha blockade therapy today and I am worried to initiate a trial of Rapaflo 8 mg and have him let me know how this has worked or him.

## 2016-07-11 ENCOUNTER — Ambulatory Visit (HOSPITAL_BASED_OUTPATIENT_CLINIC_OR_DEPARTMENT_OTHER): Payer: Commercial Managed Care - HMO | Admitting: Anesthesiology

## 2016-07-11 ENCOUNTER — Ambulatory Visit (HOSPITAL_BASED_OUTPATIENT_CLINIC_OR_DEPARTMENT_OTHER)
Admission: RE | Admit: 2016-07-11 | Discharge: 2016-07-11 | Disposition: A | Discharge status: Home / Self Care | Payer: Commercial Managed Care - HMO | Source: Ambulatory Visit | Attending: Urology | Admitting: Urology

## 2016-07-11 ENCOUNTER — Encounter (HOSPITAL_BASED_OUTPATIENT_CLINIC_OR_DEPARTMENT_OTHER)
Admission: RE | Disposition: A | Discharge status: Home / Self Care | Payer: Self-pay | Source: Ambulatory Visit | Attending: Urology

## 2016-07-11 ENCOUNTER — Encounter (HOSPITAL_BASED_OUTPATIENT_CLINIC_OR_DEPARTMENT_OTHER): Payer: Self-pay | Admitting: *Deleted

## 2016-07-11 DIAGNOSIS — N401 Enlarged prostate with lower urinary tract symptoms: Secondary | ICD-10-CM | POA: Diagnosis not present

## 2016-07-11 DIAGNOSIS — Z87891 Personal history of nicotine dependence: Secondary | ICD-10-CM | POA: Diagnosis not present

## 2016-07-11 DIAGNOSIS — R3911 Hesitancy of micturition: Secondary | ICD-10-CM | POA: Diagnosis not present

## 2016-07-11 DIAGNOSIS — D074 Carcinoma in situ of penis: Secondary | ICD-10-CM | POA: Insufficient documentation

## 2016-07-11 DIAGNOSIS — D4959 Neoplasm of unspecified behavior of other genitourinary organ: Secondary | ICD-10-CM

## 2016-07-11 DIAGNOSIS — I10 Essential (primary) hypertension: Secondary | ICD-10-CM | POA: Diagnosis not present

## 2016-07-11 DIAGNOSIS — Z7982 Long term (current) use of aspirin: Secondary | ICD-10-CM | POA: Insufficient documentation

## 2016-07-11 DIAGNOSIS — Z79899 Other long term (current) drug therapy: Secondary | ICD-10-CM | POA: Diagnosis not present

## 2016-07-11 DIAGNOSIS — R351 Nocturia: Secondary | ICD-10-CM | POA: Diagnosis not present

## 2016-07-11 DIAGNOSIS — R3915 Urgency of urination: Secondary | ICD-10-CM | POA: Diagnosis not present

## 2016-07-11 HISTORY — DX: Unspecified symptoms and signs involving the genitourinary system: R39.9

## 2016-07-11 HISTORY — DX: Personal history of other diseases of the nervous system and sense organs: Z86.69

## 2016-07-11 HISTORY — DX: Presence of spectacles and contact lenses: Z97.3

## 2016-07-11 HISTORY — DX: Other specified health status: Z78.9

## 2016-07-11 HISTORY — PX: PENILE BIOPSY: SHX6013

## 2016-07-11 HISTORY — DX: Presence of dental prosthetic device (complete) (partial): Z97.2

## 2016-07-11 HISTORY — DX: Aphasia: R47.01

## 2016-07-11 LAB — POCT I-STAT, CHEM 8
BUN: 9 mg/dL (ref 6–20)
Calcium, Ion: 1.22 mmol/L (ref 1.15–1.40)
Chloride: 106 mmol/L (ref 101–111)
Creatinine, Ser: 1 mg/dL (ref 0.61–1.24)
Glucose, Bld: 119 mg/dL — ABNORMAL HIGH (ref 65–99)
HCT: 45 % (ref 39.0–52.0)
Hemoglobin: 15.3 g/dL (ref 13.0–17.0)
Potassium: 3.4 mmol/L — ABNORMAL LOW (ref 3.5–5.1)
Sodium: 143 mmol/L (ref 135–145)
TCO2: 25 mmol/L (ref 0–100)

## 2016-07-11 SURGERY — BIOPSY, PENIS
Anesthesia: General | Site: Penis

## 2016-07-11 MED ORDER — BUPIVACAINE HCL (PF) 0.25 % IJ SOLN
INTRAMUSCULAR | Status: DC | PRN
  Administered 2016-07-11: 6 mL

## 2016-07-11 MED ORDER — DEXAMETHASONE SODIUM PHOSPHATE 10 MG/ML IJ SOLN
INTRAMUSCULAR | Status: AC
  Filled 2016-07-11: qty 1

## 2016-07-11 MED ORDER — OXYCODONE HCL 5 MG/5ML PO SOLN
5.0000 mg | Freq: Once | ORAL | Status: DC | PRN
  Filled 2016-07-11: qty 5

## 2016-07-11 MED ORDER — PROPOFOL 10 MG/ML IV BOLUS
INTRAVENOUS | Status: AC
  Filled 2016-07-11: qty 40

## 2016-07-11 MED ORDER — HYDROCODONE-ACETAMINOPHEN 10-325 MG PO TABS: 1.0000 | ORAL_TABLET | ORAL | 0 refills | Status: DC | PRN

## 2016-07-11 MED ORDER — ONDANSETRON HCL 4 MG/2ML IJ SOLN
INTRAMUSCULAR | Status: AC
  Filled 2016-07-11: qty 2

## 2016-07-11 MED ORDER — CEFAZOLIN SODIUM-DEXTROSE 2-4 GM/100ML-% IV SOLN
2.0000 g | INTRAVENOUS | Status: AC
  Administered 2016-07-11: 2 g via INTRAVENOUS
  Filled 2016-07-11: qty 100

## 2016-07-11 MED ORDER — ONDANSETRON HCL 4 MG/2ML IJ SOLN
INTRAMUSCULAR | Status: DC | PRN
  Administered 2016-07-11: 4 mg via INTRAVENOUS

## 2016-07-11 MED ORDER — BACITRACIN-NEOMYCIN-POLYMYXIN 400-5-5000 EX OINT
TOPICAL_OINTMENT | CUTANEOUS | Status: DC | PRN
  Administered 2016-07-11: 1 via TOPICAL

## 2016-07-11 MED ORDER — FENTANYL CITRATE (PF) 100 MCG/2ML IJ SOLN
25.0000 ug | INTRAMUSCULAR | Status: DC | PRN
  Filled 2016-07-11: qty 1

## 2016-07-11 MED ORDER — CEFAZOLIN SODIUM-DEXTROSE 2-4 GM/100ML-% IV SOLN
INTRAVENOUS | Status: AC
  Filled 2016-07-11: qty 100

## 2016-07-11 MED ORDER — MIDAZOLAM HCL 2 MG/2ML IJ SOLN
INTRAMUSCULAR | Status: AC
  Filled 2016-07-11: qty 2

## 2016-07-11 MED ORDER — EPHEDRINE SULFATE-NACL 50-0.9 MG/10ML-% IV SOSY
PREFILLED_SYRINGE | INTRAVENOUS | Status: DC | PRN
  Administered 2016-07-11: 15 mg via INTRAVENOUS

## 2016-07-11 MED ORDER — OXYCODONE HCL 5 MG PO TABS
5.0000 mg | ORAL_TABLET | Freq: Once | ORAL | Status: DC | PRN
  Filled 2016-07-11: qty 1

## 2016-07-11 MED ORDER — FENTANYL CITRATE (PF) 100 MCG/2ML IJ SOLN
INTRAMUSCULAR | Status: DC | PRN
  Administered 2016-07-11 (×2): 25 ug via INTRAVENOUS

## 2016-07-11 MED ORDER — LACTATED RINGERS IV SOLN
INTRAVENOUS | Status: DC
  Administered 2016-07-11: 07:00:00 via INTRAVENOUS
  Filled 2016-07-11: qty 1000

## 2016-07-11 MED ORDER — FENTANYL CITRATE (PF) 100 MCG/2ML IJ SOLN
INTRAMUSCULAR | Status: AC
  Filled 2016-07-11: qty 2

## 2016-07-11 MED ORDER — DEXAMETHASONE SODIUM PHOSPHATE 4 MG/ML IJ SOLN
INTRAMUSCULAR | Status: DC | PRN
  Administered 2016-07-11: 10 mg via INTRAVENOUS

## 2016-07-11 MED ORDER — PROPOFOL 10 MG/ML IV BOLUS
INTRAVENOUS | Status: DC | PRN
  Administered 2016-07-11: 50 mg via INTRAVENOUS
  Administered 2016-07-11: 150 mg via INTRAVENOUS

## 2016-07-11 MED ORDER — LIDOCAINE 2% (20 MG/ML) 5 ML SYRINGE
INTRAMUSCULAR | Status: DC | PRN
  Administered 2016-07-11: 60 mg via INTRAVENOUS

## 2016-07-11 SURGICAL SUPPLY — 24 items
BLADE CLIPPER SURG (BLADE) ×1 IMPLANT
BLADE SURG 15 STRL LF DISP TIS (BLADE) ×1 IMPLANT
BLADE SURG 15 STRL SS (BLADE) ×2
BNDG CONFORM 2 STRL LF (GAUZE/BANDAGES/DRESSINGS) ×1 IMPLANT
COVER BACK TABLE 60X90IN (DRAPES) ×2 IMPLANT
COVER MAYO STAND STRL (DRAPES) ×2 IMPLANT
DRAPE LAPAROTOMY 100X72 PEDS (DRAPES) ×2 IMPLANT
ELECT REM PT RETURN 9FT ADLT (ELECTROSURGICAL) ×2
ELECTRODE REM PT RTRN 9FT ADLT (ELECTROSURGICAL) ×1 IMPLANT
GLOVE BIO SURGEON STRL SZ8 (GLOVE) ×2 IMPLANT
GOWN STRL REUS W/ TWL LRG LVL3 (GOWN DISPOSABLE) ×1 IMPLANT
GOWN STRL REUS W/ TWL XL LVL3 (GOWN DISPOSABLE) ×1 IMPLANT
GOWN STRL REUS W/TWL LRG LVL3 (GOWN DISPOSABLE) ×2
GOWN STRL REUS W/TWL XL LVL3 (GOWN DISPOSABLE) ×2
KIT RM TURNOVER CYSTO AR (KITS) ×2 IMPLANT
NEEDLE HYPO 22GX1.5 SAFETY (NEEDLE) ×2 IMPLANT
NS IRRIG 500ML POUR BTL (IV SOLUTION) ×1 IMPLANT
PACK BASIN DAY SURGERY FS (CUSTOM PROCEDURE TRAY) ×2 IMPLANT
PENCIL BUTTON HOLSTER BLD 10FT (ELECTRODE) ×2 IMPLANT
SUT CHROMIC 3 0 SH 27 (SUTURE) ×1 IMPLANT
SUT CHROMIC 5 0 P 3 (SUTURE) ×1 IMPLANT
SYR CONTROL 10ML LL (SYRINGE) ×2 IMPLANT
TOWEL OR 17X24 6PK STRL BLUE (TOWEL DISPOSABLE) ×4 IMPLANT
TRAY DSU PREP LF (CUSTOM PROCEDURE TRAY) ×2 IMPLANT

## 2016-07-11 NOTE — Anesthesia Preprocedure Evaluation (Addendum)
Anesthesia Evaluation  Patient identified by MRN, date of birth, ID band Patient awake    Reviewed: Allergy & Precautions, NPO status , Patient's Chart, lab work & pertinent test results  History of Anesthesia Complications Negative for: history of anesthetic complications  Airway Mallampati: II  TM Distance: >3 FB Neck ROM: Full    Dental  (+) Upper Dentures, Dental Advisory Given, Edentulous Upper   Pulmonary former smoker,    breath sounds clear to auscultation       Cardiovascular hypertension, Pt. on medications (-) angina(-) Past MI and (-) CHF (-) dysrhythmias  Rhythm:Regular Rate:Normal     Neuro/Psych S/P Craniotomy for Meningioma 2/18 occasional confusion per wife negative psych ROS   GI/Hepatic negative GI ROS,   Endo/Other  negative endocrine ROS  Renal/GU Renal InsufficiencyRenal disease     Musculoskeletal negative musculoskeletal ROS (+)   Abdominal   Peds  Hematology negative hematology ROS (+)   Anesthesia Other Findings   Reproductive/Obstetrics negative OB ROS                           Anesthesia Physical Anesthesia Plan  ASA: II  Anesthesia Plan: General   Post-op Pain Management:    Induction: Intravenous  PONV Risk Score and Plan: 2 and Ondansetron, Dexamethasone and Treatment may vary due to age or medical condition  Airway Management Planned: LMA and Oral ETT  Additional Equipment: None  Intra-op Plan:   Post-operative Plan: Extubation in OR  Informed Consent: I have reviewed the patients History and Physical, chart, labs and discussed the procedure including the risks, benefits and alternatives for the proposed anesthesia with the patient or authorized representative who has indicated his/her understanding and acceptance.   Dental advisory given  Plan Discussed with: CRNA and Surgeon  Anesthesia Plan Comments:         Anesthesia Quick  Evaluation

## 2016-07-11 NOTE — Discharge Instructions (Addendum)
Postoperative instructions for penile surgery  Wound:  In most cases your incision will have absorbable sutures that run along the course of your incision and will dissolve within the first 10-20 days. Some will fall out even earlier. Expect some redness as the sutures dissolved but this should occur only around the sutures. If there is generalized redness, especially with increasing pain or swelling, let us know. The penis may get "black and blue" as the blood in the tissues spread. Sometimes the whole penis will turn colors. The black and blue is followed by a yellow and brown color. In time, all the discoloration will go away.  Diet:  You may return to your normal diet within 24 hours following your surgery. You may note some mild nausea and possibly vomiting the first 6-8 hours following surgery. This is usually due to the side effects of anesthesia, and will disappear quite soon. I would suggest clear liquids and a very light meal the first evening following your surgery.  Activity:  Your physical activity should be restricted the first 48 hours. During that time you should remain relatively inactive, moving about only when necessary. During the first 7-10 days following surgery he should avoid lifting any heavy objects (anything greater than 15 pounds), and avoid strenuous exercise. If you work, ask [place] specifically about your restrictions, both for work and home. We will write a note to your employer if needed.  Ice packs can be placed on and off over the penis for the first 48 hours to help relieve the pain and keep the swelling down. Frozen peas or corn in a ZipLock bag can be frozen, used and re-frozen. Fifteen minutes on and 15 minutes off is a reasonable schedule.   Hygiene:  You may shower 48 hours after your surgery. Tub bathing should be restricted until the seventh day.  Medication:  You will be sent home with some type of pain medication. In many cases you will be sent home with  a narcotic pain pill (Vicodin or Tylox). If the pain is not too bad, you may take either Tylenol (acetaminophen) or Advil (ibuprofen) which contain no narcotic agents, and might be tolerated a little better, with fewer side effects. If the pain medication you are sent home with does not control the pain, you will have to let us know. Some narcotic pain medications cannot be given or refilled by a phone call to a pharmacy.  Problems you should report to [place]:   Fever of 101.0 degrees Fahrenheit or greater.  Moderate or severe swelling under the skin incision or involving the scrotum.  Drug reaction such as hives, a rash, nausea or vomiting.   Post Anesthesia Home Care Instructions  Activity: Get plenty of rest for the remainder of the day. A responsible individual must stay with you for 24 hours following the procedure.  For the next 24 hours, DO NOT: -Drive a car -Paediatric nurse -Drink alcoholic beverages -Take any medication unless instructed by your physician -Make any legal decisions or sign important papers.  Meals: Start with liquid foods such as gelatin or soup. Progress to regular foods as tolerated. Avoid greasy, spicy, heavy foods. If nausea and/or vomiting occur, drink only clear liquids until the nausea and/or vomiting subsides. Call your physician if vomiting continues.  Special Instructions/Symptoms: Your throat may feel dry or sore from the anesthesia or the breathing tube placed in your throat during surgery. If this causes discomfort, gargle with warm salt water. The discomfort should disappear  within 24 hours.  If you had a scopolamine patch placed behind your ear for the management of post- operative nausea and/or vomiting:  1. The medication in the patch is effective for 72 hours, after which it should be removed.  Wrap patch in a tissue and discard in the trash. Wash hands thoroughly with soap and water. 2. You may remove the patch earlier than 72 hours if you  experience unpleasant side effects which may include dry mouth, dizziness or visual disturbances. 3. Avoid touching the patch. Wash your hands with soap and water after contact with the patch.

## 2016-07-11 NOTE — Anesthesia Procedure Notes (Signed)
Procedure Name: LMA Insertion Date/Time: 07/11/2016 7:29 AM Performed by: Wanita Chamberlain Pre-anesthesia Checklist: Patient identified, Emergency Drugs available, Suction available and Patient being monitored Patient Re-evaluated:Patient Re-evaluated prior to inductionOxygen Delivery Method: Circle system utilized Preoxygenation: Pre-oxygenation with 100% oxygen Intubation Type: IV induction Ventilation: Mask ventilation without difficulty LMA: LMA inserted LMA Size: 4.0 Laser Tube: Cuffed inflated with minimal occlusive pressure - saline Number of attempts: 1 Placement Confirmation: breath sounds checked- equal and bilateral and positive ETCO2 Tube secured with: Tape Dental Injury: Teeth and Oropharynx as per pre-operative assessment

## 2016-07-11 NOTE — Anesthesia Postprocedure Evaluation (Signed)
Anesthesia Post Note  Patient: Kyle Chan  Procedure(s) Performed: Procedure(s) (LRB): EXCISION OF PENILE LESION (N/A)     Patient location during evaluation: PACU Anesthesia Type: General Level of consciousness: awake and alert Pain management: pain level controlled Vital Signs Assessment: post-procedure vital signs reviewed and stable Respiratory status: spontaneous breathing, nonlabored ventilation, respiratory function stable and patient connected to nasal cannula oxygen Cardiovascular status: blood pressure returned to baseline and stable Postop Assessment: no signs of nausea or vomiting Anesthetic complications: no    Last Vitals:  Vitals:   07/11/16 0830 07/11/16 0900  BP: 115/74 (!) 144/83  Pulse: 67 97  Resp: 16   Temp:  37.1 C    Last Pain:  Vitals:   07/11/16 0900  TempSrc:   PainSc: 0-No pain                 Lulamae Skorupski

## 2016-07-11 NOTE — Op Note (Signed)
PATIENT:  Kyle Chan  PRE-OPERATIVE DIAGNOSIS: Penile lesions  POST-OPERATIVE DIAGNOSIS: Same  PROCEDURE: Excision of penile lesions (4 cm)  SURGEON:  Claybon Jabs  INDICATION: Kyle Chan is a 51 year old male who has had 2 lesions on the penis for approximately 2 years. 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 I therefore have recommended excision of these lesions.  ANESTHESIA:  General  EBL:  Minimal  DRAINS: None  LOCAL MEDICATIONS USED:  6 mL of 1/4% Marcaine  SPECIMEN:  Penile lesions to pathology  Description of procedure: After informed consent the patient was taken to the operating room and placed on the table in a supine position. General anesthesia was then administered. Once fully anesthetized the genitalia were sterilely prepped and draped in standard fashion. An official timeout was then performed.  I marked an ellipse encompassing the 2 lesions using a surgical marker and then incised along this Riannah Stagner an elliptical fashion. I then used sharp dissection to excise the lesions and associated skin. Bleeding points were then cauterized with electrocautery. Marcaine was injected into the skin edges and the skin edges were then reapproximated with running, 5-0 chromic suture. Neosporin was applied to the suture line as well as a loosely wrapped 4 x 4 and a soft wrap which was secured with tape. The patient tolerated the procedure well and there were no intraoperative complications.   PLAN OF CARE: Discharge to home after PACU  PATIENT DISPOSITION:  PACU - hemodynamically stable.

## 2016-07-11 NOTE — Transfer of Care (Signed)
Immediate Anesthesia Transfer of Care Note  Patient: Kyle Chan  Procedure(s) Performed: Procedure(s): EXCISION OF PENILE LESION (N/A)  Patient Location: PACU  Anesthesia Type:General  Level of Consciousness: awake, alert , oriented and patient cooperative  Airway & Oxygen Therapy: Patient Spontanous Breathing and Patient connected to nasal cannula oxygen  Post-op Assessment: Report given to RN and Post -op Vital signs reviewed and stable  Post vital signs: Reviewed and stable  Last Vitals:  Vitals:   07/11/16 0600  BP: (!) 153/95  Pulse: 73  Resp: 16  Temp: 36.7 C    Last Pain:  Vitals:   07/11/16 0600  TempSrc: Oral      Patients Stated Pain Goal: 7 (75/17/00 1749)  Complications: No apparent anesthesia complications

## 2016-07-12 ENCOUNTER — Encounter (HOSPITAL_BASED_OUTPATIENT_CLINIC_OR_DEPARTMENT_OTHER): Payer: Self-pay | Admitting: Urology

## 2016-08-24 DIAGNOSIS — Z86011 Personal history of benign neoplasm of the brain: Secondary | ICD-10-CM

## 2016-08-24 HISTORY — DX: Personal history of benign neoplasm of the brain: Z86.011

## 2016-08-25 ENCOUNTER — Encounter: Payer: Self-pay | Admitting: Physical Medicine & Rehabilitation

## 2016-10-06 ENCOUNTER — Encounter: Payer: Self-pay | Admitting: Speech Pathology

## 2016-10-06 ENCOUNTER — Ambulatory Visit: Payer: 59 | Admitting: Speech Pathology

## 2016-10-06 ENCOUNTER — Ambulatory Visit: Payer: 59 | Attending: Neurological Surgery | Admitting: Speech Pathology

## 2016-10-06 DIAGNOSIS — R4701 Aphasia: Secondary | ICD-10-CM | POA: Insufficient documentation

## 2016-10-06 NOTE — Therapy (Signed)
SPEECH THERAPY DISCHARGE SUMMARY  Visits from Start of Care: 7  Current functional level related to goals / functional outcomes: Pt was seen for 1 evaluation and 6 treatment sessions for aphasia. At time of last appointment, pt was utilizing compensations for anomia with cues.   Remaining deficits: At time of last appointment, pt continued to display expressive and receptive aphasia and difficulties with verbal expression, writing.    Education / Equipment: None recommended Plan: Patient agrees to discharge.  Patient goals were not met. Patient is being discharged due to not returning since the last visit.  ?????         SLP Long Term Goals - 05/05/16 1328      SLP LONG TERM GOAL #1   Title pt will write 2-3 sentence notes/emails with modified independence   Time 8   Period Weeks   Status On-going     SLP LONG TERM GOAL #2   Title pt will demo understanding of functional 2-3 sentence paragraphs (emails, etc) with rare min A    Time 8   Period Weeks   Status On-going     SLP LONG TERM GOAL #3   Title pt will use multimodal communication PRN when verbal communication breaks down   Time 8   Period Weeks   Status On-going     SLP LONG TERM GOAL #4   Title pt will demo [facility] speech/language in 5 minutes min-mod complex conversation   Time 8   Period Weeks   Status On-going     SLP LONG TERM GOAL #5   Title pt will demo [facility] auditory comprehension with modified independnence in 5 minutes min-mod complex conversation   Time 8   Period Weeks   Status On-going         SLP Short Term Goals - 05/05/16 1328      SLP SHORT TERM GOAL #1   Title pt will demo understanding of spoken sentence-length material using superlatives, qualifiers (over, under, less, greater, etc), and prepositions with 85% and rare min A   Time 1   Period Weeks   Status On-going     SLP SHORT TERM GOAL #2   Title pt will complete cloze phrases/opposites with 95% success (functional responses) with  self correction over three sessions   Baseline 3.29.18, 04/25/16, 04/28/16   Time 1   Period Weeks   Status Achieved     SLP SHORT TERM GOAL #3   Title pt will complete rote tasks (except counting) with 95% success with self correction   Time 1   Period Weeks   Status Partially Met     SLP SHORT TERM GOAL #4   Title pt will match word (f:4) to a picture 80% success   Baseline 3.22, 04/28/16   Time 1   Period Weeks   Status Achieved     SLP SHORT TERM GOAL #5   [facility] [address] [place], [place], [address] Phone: [phone]   Fax:  [phone]  Patient Details  Name: Kyle Chan MRN: [id number] Date of Birth: [birth year] Referring Provider:  No ref. provider found  Encounter Date: 10/06/2016  Deneise Lever, [place], [place] 10/06/2016, 9:39 AM  [facility] [address] [place], [place], [address] Phone: [phone]   Fax:  [phone]

## 2016-10-07 NOTE — Therapy (Signed)
[facility] [address] [place], [place], [address] Phone: [phone]   Fax:  [phone]  Speech Language Pathology Evaluation  Patient Details  Name: Kyle Chan MRN: [id number] Date of Birth: [birth year] Referring Provider: Eustace Moore, MD  Encounter Date: 10/06/2016      End of Session - 10/06/16 1531    Visit Number 1   Number of Visits 9   Date for SLP Re-Evaluation 11/11/16   SLP Start Time 1532   SLP Stop Time  1616   SLP Time Calculation (min) 44 min   Activity Tolerance Patient tolerated treatment well      Past Medical History:  Diagnosis Date  . 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 History of cerebral meningioma    S/P  RESECTION 03-03-2016---  CAUSED EXPRESSIVE APHASIA  AND THIS CONTINUES  . Hypertension   . Lower urinary tract symptoms (LUTS)    rapaflo has helped symptoms  . Poor historian    due to expressive aphasia/  congitive impairment  . Wears dentures    upper  . Wears glasses     Past Surgical History:  Procedure Laterality Date  . CRANIOTOMY Left 03/03/2016   Procedure: LEFT CRANIOTOMY FOR BRAIN TUMOR;  Surgeon: Eustace Moore, MD;  Location: [facility];  Service: Neurosurgery;  Laterality: Left;  . CRANIOTOMY Left 03/21/2016   Procedure: IRRIGATION AND DEBRIDMENT OF CRANIOTOMY  AND EXPLORATION OF WOUND;  Surgeon: Eustace Moore, MD;  Location: [facility];  Service: Neurosurgery;  Laterality: Left;  IRRIGATION AND DEBRIDMENT OF CRANIOTOMY  AND EXPLORATION OF WOUND  . NASAL RECONSTRUCTION WITH SEPTAL REPAIR N/A 11/18/2012   Procedure: NASAL RECONSTRUCTION WITH SEPTAL REPAIR;  Surgeon: Melida Quitter, MD;  Location: [facility];  Service: ENT;  Laterality: N/A;  . PENILE BIOPSY N/A 07/11/2016   Procedure: EXCISION OF PENILE LESION;  Surgeon: Kathie Rhodes, MD;  Location: [facility];  Service: Urology;  Laterality: N/A;     There were no vitals filed for this visit.      Subjective Assessment - 10/06/16 1532    Subjective "I don't really get into conversations because I get messed up"   Patient is accompained by: Family member   Special Tests wife   Currently in Pain? No/denies            SLP Evaluation [facility] - 10/06/16 1532      SLP Visit Information   SLP Received On 10/06/16   Referring Provider Eustace Moore, MD   Onset Date 09/02/16   Medical Diagnosis S/P frontotemporal tumor resection; Benign neoplasm of meninges, unspecified     General Information   HPI Pt is a 51 y/o male with a PMH of frontotemporal craniotomy 2/8 for resection of meningioma.  Preoperative MRI showed large left convexity meningioma with mass effect on the underlying left frontal and temporal lobes, along with minimal adjacent edema. Pt had brief course of ST for moderate-severe expressive and receptive aphasia (6 treatment sessions) and was d/c due to not returning after his last appointment on 05/05/16.  Referred by Dr. Ronnald Ramp for persisting speech difficulties.    Behavioral/Cognition alert, pleasant, cooperative   Mobility Status ambulated to session     Prior Functional Status   Cognitive/Linguistic Baseline Within functional limits   Type of Home House    Lives With Spouse   Vocation On disability     Pain Assessment  Pain Assessment No/denies pain     Cognition   Overall Cognitive Status Within Functional Limits for tasks assessed     Auditory Comprehension   Overall Auditory Comprehension Impaired   Yes/No Questions Within Functional Limits   Paragraph Comprehension (via yes/no questions) 76-100% accurate  80% accuracy   Conversation Simple   Other Conversation Comments Simple conversation in context appeared functionally understood, simple conversation out of context with extra time & repeats was near-functional   Interfering Components Processing speed   EffectiveTechniques Repetition;Extra  processing time;Visual/Gestural cues     Visual Recognition/Discrimination   Discrimination Not tested     Reading Comprehension   Reading Status Impaired   Word level 76-100% accurate   Sentence Level 76-100% accurate   Paragraph Level 76-100% accurate  80% accurate     Expression   Primary Mode of Expression Verbal     Verbal Expression   Overall Verbal Expression Impaired   Level of Generative/Spontaneous Verbalization Sentence   Naming Impairment   Responsive 76-100% accurate   Confrontation 50-74% accurate   Convergent 75-100% accurate  80% accurate   Divergent Other (comment)  8 animals in 60 seconds   Verbal Errors Semantic paraphasias;Phonemic paraphasias;Perseveration   Pragmatics No impairment   Effective Techniques Open ended questions;Semantic cues;Sentence completion;Phonemic cues   Non-Verbal Means of Communication Not applicable   Other Verbal Expression Comments Pt's simple conversation was functional with rare repeats requested. Pt awareness of errors was minimal.      Written Expression   Dominant Hand Right   Written Expression Exceptions to [facility]   Self Formulation Ability Phrase   Overall Writen Expression Pt formulates phrases of 3-6 words with extended time, written paraphasias, neologisms prevalent     Oral Motor/Sensory Function   Overall Oral Motor/Sensory Function Appears within functional limits for tasks assessed     Motor Speech   Overall Motor Speech Appears within functional limits for tasks assessed     Standardized Assessments   Standardized Assessments  [facility] Examiniation-3rd  portions of this assessment             SLP Education - 10/07/16 1119    Education provided Yes   Education Details Language vs. memory impairment, prognosis, pt/communication partners will need to utilize compensations/strategies for aphasia   Person(s) Educated Patient;Spouse   Methods Explanation;Handout   Comprehension Verbalized  understanding;Need further instruction            SLP Long Term Goals - 10/07/16 1139      SLP LONG TERM GOAL #1   Title pt will write 2-3 sentence notes/emails with modified independence   Time 4   Period Weeks   Status New     SLP LONG TERM GOAL #2   Title pt will demo understanding of functional 2-3 sentence paragraphs (emails, etc) with rare min A    Time 4   Period Weeks   Status New     SLP LONG TERM GOAL #3   Title pt will use multimodal communication PRN when verbal communication breaks down   Time 4   Period Weeks   Status New     SLP LONG TERM GOAL #4   Title pt will demo [facility] speech/language in 5 minutes mod complex conversation   Time 4   Period Weeks   Status New     SLP LONG TERM GOAL #5   Title pt will demo [facility] auditory comprehension with modified independnence in 5 minutes mod complex conversation  Time 4   Period Weeks   Status New          Plan - 10/06/16 1532    Clinical Impression Statement Patient presents today with mild receptive and moderate expressive aphasia. For simple contextual conversation, pt's expression and comprehension are grossly functional. However, with increasing complexity, abstraction, pt has impaired expression and comprehension, with circumlocutory speech, anomia/dysnomia. Confrontation naming (BNT short form 8/15) moderately impaired, with semantic, phonemic paraphasias and occasional perseverations noted. 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 Reading/writing also impaired, with comprehension of 80% for simple paragraphs, simple phrase level writing with reduced error awareness. I recommend trial period of skilled ST to address expressive and receptive language impairments for possible return to work for 4 weeks, with possiblity to extend based  on pt's progress and participation, due to time post-onset and pt's attendance of prior course of ST.    Speech Therapy Frequency 2x / week   Duration 4 weeks   Treatment/Interventions Language facilitation;Environmental controls;Multimodal communcation approach;Internal/external aids;SLP instruction and feedback;Functional tasks   Potential to Achieve Goals Good   Potential Considerations Previous level of function   SLP Home Exercise Plan following written directions   Consulted and Agree with Plan of Care Patient      Patient will benefit from skilled therapeutic intervention in order to improve the following deficits and impairments:   Aphasia    Problem List Patient Active Problem List   Diagnosis Date Noted  . Fever 03/17/2016  . S/P craniotomy 03/03/2016  . Brain mass 03/02/2016   Deneise Lever, [place], CCC-SLP Speech-Language Pathologist  Aliene Altes 10/07/2016, 11:41 AM  [facility] [address] [place], [place], [address] Phone: [phone]   Fax:  [phone]  Name: Kyle Chan MRN: [id number] Date of Birth: Sep 04, 1965

## 2016-10-10 ENCOUNTER — Encounter: Payer: Self-pay | Admitting: Physical Medicine & Rehabilitation

## 2016-10-10 ENCOUNTER — Encounter: Payer: 59 | Attending: Physical Medicine & Rehabilitation | Admitting: Physical Medicine & Rehabilitation

## 2016-10-10 ENCOUNTER — Ambulatory Visit: Payer: 59 | Admitting: Speech Pathology

## 2016-10-10 VITALS — BP 149/90 | HR 73 | Resp 73

## 2016-10-10 DIAGNOSIS — Z87891 Personal history of nicotine dependence: Secondary | ICD-10-CM | POA: Diagnosis not present

## 2016-10-10 DIAGNOSIS — G4700 Insomnia, unspecified: Secondary | ICD-10-CM | POA: Insufficient documentation

## 2016-10-10 DIAGNOSIS — Z9889 Other specified postprocedural states: Secondary | ICD-10-CM | POA: Diagnosis not present

## 2016-10-10 DIAGNOSIS — R4701 Aphasia: Secondary | ICD-10-CM

## 2016-10-10 DIAGNOSIS — Z86011 Personal history of benign neoplasm of the brain: Secondary | ICD-10-CM | POA: Diagnosis not present

## 2016-10-10 DIAGNOSIS — F5101 Primary insomnia: Secondary | ICD-10-CM

## 2016-10-10 DIAGNOSIS — I10 Essential (primary) hypertension: Secondary | ICD-10-CM | POA: Diagnosis not present

## 2016-10-10 MED ORDER — TRAZODONE HCL 50 MG PO TABS: 50.0000 mg | ORAL_TABLET | Freq: Every day | ORAL | 2 refills | Status: DC

## 2016-10-10 NOTE — Patient Instructions (Addendum)
LET'S GIVE THERAPY A GOOD COUPLE MONTHS TO SEE HOW YOUR LANGUAGE IS COMING ALONG. WE COULD CONSIDER A STIMULANT POTENTIALLY AT THE END OF THAT PERIOD  IN THE MEAN TIME YOU NEED TO DO YOUR HOME SPEECH EXERCISES. DON'T BE AFRAID TO SPEAK AND USE YOUR WORDS!!   STOP THE EXCEDRIN PM. IF YOU WANT TO USE EXCEDRIN ALONE FOR HEADACHES THAT'S FINE

## 2016-10-10 NOTE — Therapy (Signed)
[facility] [address] [place], [place], [address] Phone: [phone]   Fax:  [phone]  Speech Language Pathology Treatment  Patient Details  Name: Kyle Chan MRN: [id number] Date of Birth: 09-Jan-1966 Referring Provider: Eustace Moore, MD  Encounter Date: 10/10/2016      End of Session - 10/10/16 1800    Visit Number 2   Number of Visits 9   Date for SLP Re-Evaluation 11/11/16   SLP Start Time 8502   SLP Stop Time  1503   SLP Time Calculation (min) 43 min   Activity Tolerance Patient tolerated treatment well      Past Medical History:  Diagnosis Date  . 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 History of cerebral meningioma    S/P  RESECTION 03-03-2016---  CAUSED EXPRESSIVE APHASIA  AND THIS CONTINUES  . Hypertension   . Lower urinary tract symptoms (LUTS)    rapaflo has helped symptoms  . Poor historian    due to expressive aphasia/  congitive impairment  . Wears dentures    upper  . Wears glasses     Past Surgical History:  Procedure Laterality Date  . CRANIOTOMY Left 03/03/2016   Procedure: LEFT CRANIOTOMY FOR BRAIN TUMOR;  Surgeon: Eustace Moore, MD;  Location: [facility];  Service: Neurosurgery;  Laterality: Left;  . CRANIOTOMY Left 03/21/2016   Procedure: IRRIGATION AND DEBRIDMENT OF CRANIOTOMY  AND EXPLORATION OF WOUND;  Surgeon: Eustace Moore, MD;  Location: [facility];  Service: Neurosurgery;  Laterality: Left;  IRRIGATION AND DEBRIDMENT OF CRANIOTOMY  AND EXPLORATION OF WOUND  . NASAL RECONSTRUCTION WITH SEPTAL REPAIR N/A 11/18/2012   Procedure: NASAL RECONSTRUCTION WITH SEPTAL REPAIR;  Surgeon: Melida Quitter, MD;  Location: [facility];  Service: ENT;  Laterality: N/A;  . PENILE BIOPSY N/A 07/11/2016   Procedure: EXCISION OF PENILE LESION;  Surgeon: Kathie Rhodes, MD;  Location: [facility];  Service: Urology;  Laterality: N/A;     There were no vitals filed for this visit.      Subjective Assessment - 10/10/16 1639    Subjective "I guess it just takes time."               ADULT SLP TREATMENT - 10/10/16 1640      General Information   Behavior/Cognition Alert;Cooperative;Pleasant mood   Patient Positioning Upright in chair   Oral care provided N/A     Treatment Provided   Treatment provided Cognitive-Linquistic     Pain Assessment   Pain Assessment No/denies pain     Cognitive-Linquistic Treatment   Treatment focused on Aphasia   Skilled Treatment SLP reviewed plan of care and proposed goals for treatment. Pt and wife state they are hopeful after follow-up visit with Dr. Naaman Plummer, and realize pt needs to "put in the work." SLP reviewed pt's completed homework; simple written directions with 90% accuracy, for increased complexity, pt achieved 50% accuracy, required mod-max A for error correction. Pt told SLP that when he receives a text, he asks his wife to respond. SLP demo'd use of voice recognition for texting. Pt sent 3 voice texts to his wife (who had left treatment room), with appropriate responses, with usual min A. Facilitated describing similarities/differences for 2 objects with occasional mod A questioning cues.      Assessment / Recommendations / Plan   Plan Continue with current plan of care     Progression  Toward Goals   Progression toward goals Progressing toward goals          SLP Education - 10/10/16 1759    Education provided Yes   Education Details use of voice recognition to improve text communication   Person(s) Educated Patient;Spouse   Methods Explanation;Demonstration;Verbal cues   Comprehension Verbalized understanding;Returned demonstration;Need further instruction            SLP Long Term Goals - 10/10/16 1802      SLP LONG TERM GOAL #1   Title pt will write 2-3 sentence notes/emails with modified independence   Time 4   Period Weeks   Status On-going      SLP LONG TERM GOAL #2   Title pt will demo understanding of functional 2-3 sentence paragraphs (emails, etc) with rare min A    Time 4   Period Weeks   Status On-going     SLP LONG TERM GOAL #3   Title pt will use multimodal communication PRN when verbal communication breaks down   Time 4   Period Weeks   Status On-going     SLP LONG TERM GOAL #4   Title pt will demo [facility] speech/language in 5 minutes mod complex conversation   Time 4   Period Weeks   Status On-going     SLP LONG TERM GOAL #5   Title pt will demo [facility] auditory comprehension with modified independnence in 5 minutes mod complex conversation   Time 4   Period Weeks   Status On-going          Plan - 10/10/16 1801    Clinical Impression Statement Patient presents today with mild receptive and moderate expressive aphasia. For simple contextual conversation, pt's expression and comprehension are grossly functional. However, with increasing complexity, abstraction, pt has impaired expression and comprehension, with circumlocutory speech, anomia/dysnomia. I recommend skilled ST to address expressive and receptive language impairments for possible return to work for 4 weeks, with possiblity to extend based on pt's progress and participation, due to time post-onset and pt's attendance of prior course of ST.    Speech Therapy Frequency 2x / week   Duration 4 weeks   Potential to Achieve Goals Good   Potential Considerations Previous level of function   SLP Home Exercise Plan reviewed and provided   Consulted and Agree with Plan of Care Patient      Patient will benefit from skilled therapeutic intervention in order to improve the following deficits and impairments:   Aphasia    Problem List Patient Active Problem List   Diagnosis Date Noted  . Insomnia 10/10/2016  . Expressive aphasia 10/10/2016  . Fever 03/17/2016  . S/P craniotomy 03/03/2016  . Brain mass 03/02/2016   Deneise Lever, [place],  CCC-SLP Speech-Language Pathologist  Aliene Altes 10/10/2016, 6:03 PM  [facility] [address] [place], [place], [address] Phone: [phone]   Fax:  [phone]   Name: JAYVION STEFANSKI MRN: [id number] Date of Birth: 12/11/1965

## 2016-10-10 NOTE — Progress Notes (Signed)
Subjective:    Patient ID: Kyle Chan, male    DOB: May 15, 1965, 52 y.o.   MRN: [id number]  HPI  This is an initial visit for Kyle Chan, kindly referred to this office by Dr. Sherley Bounds. He was admitted to [facility] on 03/02/16 with word finding deficits. Work up revealed a large, left frontal temporal mengioma.  A craniotomy and resection of this large mengioma was performed on 03/03/16 by Dr. Ronnald Ramp. Course was complicated by a subgaleal fluid collection which required craniotomy, drainage, irrigation and debridement which wsa performed on 03/17/16.   After surgery pt/family noticed immediate improvement in his language, but Kyle Chan has continued to display word finding deficits, paraphasias which really hasn't improved a great deal since. Additionally there is some concern over memory loss. Kyle Chan is independent at a house hold level. Wife denies any unsafe behaviors at home.  He underwent a course of speech therapy initially and has been referred back again to [facility] neuro-rehab for further sessions. Speech therapy has noted deficits in comprehension and processing as well.   Prior to the hospitalization, he was a Geophysicist/field seismologist for [employer]. Currently he is on long term disability currently. He would like to return to work at some point.   Sleep has been an issue too. He is sleeping for about 4-5 hours per night. It is often broken. He is using excedrin PM and melatonin which haven't been overly beneficial. Prior to February he didn't have the best of sleep patterns. Kyle Chan states he's not depressed and has tried to remain up beat. However, he tries to avoid speaking with others/have conversations because of his language problems.   Pain Inventory Average Pain 4 Pain Right Now 4 My pain is sharp  In the last 24 hours, has pain interfered with the following? General activity 0 Relation with others 0 Enjoyment of life 3 What TIME of day is your pain at its worst?  daytime Sleep (in general) Poor  Pain is worse with: some activites Pain improves with: medication Relief from Meds: 5  Mobility walk without assistance ability to climb steps?  yes do you drive?  yes  Function employed # of hrs/week . 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 No pertinent family history.Father negative for cancer, mother negative for cancer Social History   Social History  . Marital status: Married    Spouse name: N/A  . Number of children: N/A  . Years of education: N/A   Social History Main Topics  . Smoking status: Former Smoker    Years: 27.00    Types: Cigarettes    Quit date: 07/04/2008  . Smokeless tobacco: Never Used  . Alcohol use No  . Drug use: No  . Sexual activity: Not Asked   Other Topics Concern  . None   Social History Narrative  . None   Past Surgical History:  Procedure Laterality Date  . CRANIOTOMY Left 03/03/2016   Procedure: LEFT CRANIOTOMY FOR BRAIN TUMOR;  Surgeon: Eustace Moore, MD;  Location: [facility];  Service: Neurosurgery;  Laterality: Left;  . CRANIOTOMY Left 03/21/2016   Procedure: IRRIGATION AND DEBRIDMENT OF CRANIOTOMY  AND EXPLORATION OF WOUND;  Surgeon: Eustace Moore, MD;  Location: [facility];  Service: Neurosurgery;  Laterality: Left;  IRRIGATION AND DEBRIDMENT OF CRANIOTOMY  AND EXPLORATION OF WOUND  . NASAL RECONSTRUCTION WITH SEPTAL  REPAIR N/A 11/18/2012   Procedure: NASAL RECONSTRUCTION WITH SEPTAL REPAIR;  Surgeon: Melida Quitter, MD;  Location: [facility];  Service: ENT;  Laterality: N/A;  . PENILE BIOPSY N/A 07/11/2016   Procedure: EXCISION OF PENILE LESION;  Surgeon: Kathie Rhodes, MD;  Location: [facility];  Service: Urology;  Laterality: N/A;   Past Medical History:  Diagnosis Date  . 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 History  of cerebral meningioma    S/P  RESECTION 03-03-2016---  CAUSED EXPRESSIVE APHASIA  AND THIS CONTINUES  . Hypertension   . Lower urinary tract symptoms (LUTS)    rapaflo has helped symptoms  . Poor historian    due to expressive aphasia/  congitive impairment  . Wears dentures    upper  . Wears glasses    There were no vitals taken for this visit.  Opioid Risk Score:   Fall Risk Score:  `1  Depression screen PHQ 2/9  Depression screen PHQ 2/9 10/10/2016  Decreased Interest 2  Down, Depressed, Hopeless 3  PHQ - 2 Score 5  Altered sleeping 3  Tired, decreased energy 3  Change in appetite 2  Feeling bad or failure about yourself  3  Trouble concentrating 2  Moving slowly or fidgety/restless 3  Suicidal thoughts 0  PHQ-9 Score 21  Difficult doing work/chores Somewhat difficult    Review of Systems  Constitutional: Positive for unexpected weight change.  HENT: Negative.   Eyes: Negative.   Respiratory: Negative.   Cardiovascular: Negative.   Gastrointestinal: Negative.   Endocrine:       High blood sugar  Genitourinary: Negative.   Musculoskeletal: Negative.   Skin: Negative.   Allergic/Immunologic: Negative.   Neurological: Positive for headaches.  Hematological: Negative.   Psychiatric/Behavioral: Positive for confusion.  All other systems reviewed and are negative.      Objective:   Physical Exam   General: Alert and oriented x 3, No apparent distress HEENT: Head is normocephalic, atraumatic, PERRLA, EOMI, sclera anicteric, oral mucosa pink and moist, dentition intact, ext ear canals clear,  Neck: Supple without JVD or lymphadenopathy Heart: Reg rate and rhythm. No murmurs rubs or gallops Chest: CTA bilaterally without wheezes, rales, or rhonchi; no distress Abdomen: Soft, non-tender, non-distended, bowel sounds positive. Extremities: No clubbing, cyanosis, or edema. Pulses are 2+ Skin: Clean and intact without signs of breakdown. Crani scar healed.   Neuro: Pt is fairly attentive. He has expressive language deficits but is able to convey thoughts and ideas with extra time. He needs extra time to process more detailed tasks. He recalled 1/3 words after 5 minutes. He could not sequence numbers 4 apart without assistance. He misspelled world forward and backwards. He followed all simple commands accurately and without delay.  Cranial nerves 2-12 are intact. Sensory exam is normal. Reflexes are 2+ in all 4's. Fine motor coordination is intact. No tremors. Motor function is grossly 5/5.  Musculoskeletal: Full ROM, No pain with AROM or PROM in the neck, trunk, or extremities. Posture appropriate Psych: Pt's affect is appropriate. Pt is cooperative        Assessment & Plan:  1. Large left fronto-temporal mengioma s/p craniotomy and resection. He is dealing with residual expressive greater than receptive language deficits as well as delays in processing. I see minimal memory dysfunction although it's difficult to discern given his aphasia 2. Insomnia   Plan: 1. Continue with outpt speech therapies as prescribed. He  needs to follow his sessions up with regular exercises and practice at home. He can't be afraid to speak in public and around others. I reiterated to Kyle Chan and his wife that this process will take some time, but that there is room for further improvement. He may be a candidate for a longer term program such as [employer]'s speech program in the future 2. Maximize sleep: trial of trazodone 50mg  qhs  -stop benadryl  -can continue melatonin if he chooses 3. Consider a trial of a stimulant to maximize attention/focus depending upon success with therapy   Forty-five minutes of face to face patient care time were spent during this visit. All questions were encouraged and answered. Greater than 50% of time during this encounter was spent counseling patient/family in regard to etiologies of his aphasia, potential treatment options, vocational  considerations.   Follow up with me in 2 months.   I would like to thank Dr. Ronnald Ramp for the opportunity to participate in Kyle Chan's care

## 2016-10-17 ENCOUNTER — Ambulatory Visit: Payer: 59 | Admitting: Speech Pathology

## 2016-10-17 DIAGNOSIS — R4701 Aphasia: Secondary | ICD-10-CM

## 2016-10-17 NOTE — Therapy (Signed)
[facility] [address] [place], [place], [address] Phone: [phone]   Fax:  [phone]  Speech Language Pathology Treatment  Patient Details  Name: Kyle Chan MRN: [id number] Date of Birth: 1965/04/20 Referring Provider: Eustace Moore, MD  Encounter Date: 10/17/2016      End of Session - 10/17/16 1302    Visit Number 3   Number of Visits 9   Date for SLP Re-Evaluation 11/11/16   SLP Start Time 0931   SLP Stop Time  1016   SLP Time Calculation (min) 45 min   Activity Tolerance Patient tolerated treatment well      Past Medical History:  Diagnosis Date  . 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 History of cerebral meningioma    S/P  RESECTION 03-03-2016---  CAUSED EXPRESSIVE APHASIA  AND THIS CONTINUES  . Hypertension   . Lower urinary tract symptoms (LUTS)    rapaflo has helped symptoms  . Poor historian    due to expressive aphasia/  congitive impairment  . Wears dentures    upper  . Wears glasses     Past Surgical History:  Procedure Laterality Date  . CRANIOTOMY Left 03/03/2016   Procedure: LEFT CRANIOTOMY FOR BRAIN TUMOR;  Surgeon: Eustace Moore, MD;  Location: [facility];  Service: Neurosurgery;  Laterality: Left;  . CRANIOTOMY Left 03/21/2016   Procedure: IRRIGATION AND DEBRIDMENT OF CRANIOTOMY  AND EXPLORATION OF WOUND;  Surgeon: Eustace Moore, MD;  Location: [facility];  Service: Neurosurgery;  Laterality: Left;  IRRIGATION AND DEBRIDMENT OF CRANIOTOMY  AND EXPLORATION OF WOUND  . NASAL RECONSTRUCTION WITH SEPTAL REPAIR N/A 11/18/2012   Procedure: NASAL RECONSTRUCTION WITH SEPTAL REPAIR;  Surgeon: Melida Quitter, MD;  Location: [facility];  Service: ENT;  Laterality: N/A;  . PENILE BIOPSY N/A 07/11/2016   Procedure: EXCISION OF PENILE LESION;  Surgeon: Kathie Rhodes, MD;  Location: [facility];  Service: Urology;  Laterality: N/A;     There were no vitals filed for this visit.      Subjective Assessment - 10/17/16 0933    Subjective "It was good. We went out to dinner" re: weekend   Currently in Pain? No/denies               ADULT SLP TREATMENT - 10/17/16 0931      General Information   Behavior/Cognition Alert;Cooperative;Pleasant mood   Patient Positioning Upright in chair     Treatment Provided   Treatment provided Cognitive-Linquistic     Pain Assessment   Pain Assessment No/denies pain     Cognitive-Linquistic Treatment   Treatment focused on Aphasia   Skilled Treatment SLP engaged pt in 7 minutes simple conversation during which pt demo'd compensations for anomia with extended time, WNL understanding. In simple sentence generation with 2 given words, pt required extended time and usual mod A. Unscrambling sentences with occasional mod A.      Assessment / Recommendations / Plan   Plan Continue with current plan of care     Progression Toward Goals   Progression toward goals Progressing toward goals              SLP Long Term Goals - 10/17/16 1259      SLP LONG TERM GOAL #1   Title pt will write 2-3 sentence notes/emails with modified independence   Time 4   Period Weeks   Status On-going  SLP LONG TERM GOAL #2   Title pt will demo understanding of functional 2-3 sentence paragraphs (emails, etc) with rare min A    Time 4   Period Weeks   Status On-going     SLP LONG TERM GOAL #3   Title pt will use multimodal communication PRN when verbal communication breaks down   Time 4   Period Weeks   Status On-going     SLP LONG TERM GOAL #4   Title pt will demo [facility] speech/language in 5 minutes mod complex conversation   Time 4   Period Weeks   Status On-going     SLP LONG TERM GOAL #5   Title pt will demo [facility] auditory comprehension with modified independence in 5 minutes mod complex conversation   Time 4   Period Weeks   Status On-going          Plan -  10/17/16 1303    Clinical Impression Statement Patient presents today with mild receptive and moderate expressive aphasia. For simple contextual conversation, pt's expression and comprehension are grossly functional. However, with increasing complexity, abstraction, pt has impaired expression and comprehension, with circumlocutory speech, anomia/dysnomia. I recommend skilled ST to address expressive and receptive language impairments for possible return to work for 4 weeks, with possiblity to extend based on pt's progress and participation, due to time post-onset and pt's attendance of prior course of ST.    Speech Therapy Frequency 2x / week   Duration 4 weeks   Treatment/Interventions Language facilitation;Environmental controls;Multimodal communcation approach;Internal/external aids;SLP instruction and feedback;Functional tasks   Potential to Achieve Goals Good   Potential Considerations Previous level of function   SLP Home Exercise Plan reviewed and provided   Consulted and Agree with Plan of Care Patient      Patient will benefit from skilled therapeutic intervention in order to improve the following deficits and impairments:   Aphasia    Problem List Patient Active Problem List   Diagnosis Date Noted  . Insomnia 10/10/2016  . Expressive aphasia 10/10/2016  . Fever 03/17/2016  . S/P craniotomy 03/03/2016  . Brain mass 03/02/2016   Deneise Lever, [place], CCC-SLP Speech-Language Pathologist   Aliene Altes 10/17/2016, 1:03 PM  [facility] [address] [place], [place], [address] Phone: [phone]   Fax:  [phone]   Name: Kyle Chan MRN: [id number] Date of Birth: [birth year]

## 2016-10-17 NOTE — Patient Instructions (Signed)
  Please complete the assigned speech therapy homework and return it to your next session.  

## 2016-10-20 ENCOUNTER — Ambulatory Visit: Payer: 59 | Admitting: Speech Pathology

## 2016-10-20 DIAGNOSIS — R4701 Aphasia: Secondary | ICD-10-CM | POA: Diagnosis not present

## 2016-10-20 NOTE — Therapy (Signed)
[facility] [address] [place], [place], [address] Phone: [phone]   Fax:  [phone]  Speech Language Pathology Treatment  Patient Details  Name: Kyle Chan MRN: [id number] Date of Birth: 1965/03/18 Referring Provider: Eustace Moore, MD  Encounter Date: 10/20/2016      End of Session - 10/20/16 1238    Visit Number 4   Number of Visits 9   Date for SLP Re-Evaluation 11/11/16   SLP Start Time 0845   SLP Stop Time  0930   SLP Time Calculation (min) 45 min   Activity Tolerance Patient tolerated treatment well      Past Medical History:  Diagnosis Date  . 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 History of cerebral meningioma    S/P  RESECTION 03-03-2016---  CAUSED EXPRESSIVE APHASIA  AND THIS CONTINUES  . Hypertension   . Lower urinary tract symptoms (LUTS)    rapaflo has helped symptoms  . Poor historian    due to expressive aphasia/  congitive impairment  . Wears dentures    upper  . Wears glasses     Past Surgical History:  Procedure Laterality Date  . CRANIOTOMY Left 03/03/2016   Procedure: LEFT CRANIOTOMY FOR BRAIN TUMOR;  Surgeon: Eustace Moore, MD;  Location: [facility];  Service: Neurosurgery;  Laterality: Left;  . CRANIOTOMY Left 03/21/2016   Procedure: IRRIGATION AND DEBRIDMENT OF CRANIOTOMY  AND EXPLORATION OF WOUND;  Surgeon: Eustace Moore, MD;  Location: [facility];  Service: Neurosurgery;  Laterality: Left;  IRRIGATION AND DEBRIDMENT OF CRANIOTOMY  AND EXPLORATION OF WOUND  . NASAL RECONSTRUCTION WITH SEPTAL REPAIR N/A 11/18/2012   Procedure: NASAL RECONSTRUCTION WITH SEPTAL REPAIR;  Surgeon: Melida Quitter, MD;  Location: [name];  Service: ENT;  Laterality: N/A;  . PENILE BIOPSY N/A 07/11/2016   Procedure: EXCISION OF PENILE LESION;  Surgeon: Kathie Rhodes, MD;  Location: [facility];  Service: Urology;  Laterality: N/A;     There were no vitals filed for this visit.      Subjective Assessment - 10/20/16 0851    Subjective "Not good." pt had trouble with writing, unscrambling sentences homework   Currently in Pain? No/denies               ADULT SLP TREATMENT - 10/20/16 0851      General Information   Behavior/Cognition Alert;Cooperative;Pleasant mood   Patient Positioning Upright in chair     Treatment Provided   Treatment provided Cognitive-Linquistic     Pain Assessment   Pain Assessment No/denies pain     Cognitive-Linquistic Treatment   Treatment focused on Aphasia   Skilled Treatment Pt stated he had difficulty with unscrambling sentences and could not complete all HW (did make attempts). SLP demo'd crossing out and writing words as used, identifying phrase groups; pt implemented this strategy and was able to unscramble 7 word sentences with occasional min A. Simple fill-in-the blank homonyms with rare min A, cues to slow rate for sentence-level reading comprehension. Required usual mod A to provide synonyms.      Assessment / Recommendations / Plan   Plan Continue with current plan of care     Progression Toward Goals   Progression toward goals Progressing toward goals          SLP Education - 10/20/16 1239    Education provided Yes   Education Details take breaks when fatigued; OK  to skip HW items and return at a later time    Person(s) Educated Patient   Methods Explanation   Comprehension Verbalized understanding            SLP Long Term Goals - 10/20/16 1237      SLP LONG TERM GOAL #1   Title pt will write 2-3 sentence notes/emails with modified independence   Time 3   Period Weeks   Status On-going     SLP LONG TERM GOAL #2   Title pt will demo understanding of functional 2-3 sentence paragraphs (emails, etc) with rare min A    Time 3   Period Weeks   Status On-going     SLP LONG TERM GOAL #3   Title pt will use multimodal communication PRN when  verbal communication breaks down   Time 3   Period Weeks   Status On-going     SLP LONG TERM GOAL #4   Title pt will demo [facility] speech/language in 5 minutes mod complex conversation   Time 3   Period Weeks   Status On-going          Plan - 10/20/16 1238    Speech Therapy Frequency 2x / week   Duration 4 weeks   Treatment/Interventions Language facilitation;Environmental controls;Multimodal communcation approach;Internal/external aids;SLP instruction and feedback;Functional tasks   Potential to Achieve Goals Good   Potential Considerations Previous level of function   SLP Home Exercise Plan reviewed and provided   Consulted and Agree with Plan of Care Patient      Patient will benefit from skilled therapeutic intervention in order to improve the following deficits and impairments:   Aphasia    Problem List Patient Active Problem List   Diagnosis Date Noted  . Insomnia 10/10/2016  . Expressive aphasia 10/10/2016  . Fever 03/17/2016  . S/P craniotomy 03/03/2016  . Brain mass 03/02/2016   Deneise Lever, [place], CCC-SLP Speech-Language Pathologist   Aliene Altes 10/20/2016, 12:41 PM  [facility] [address] [place], [place], [address] Phone: [phone]   Fax:  [phone]   Name: Kyle Chan MRN: [id number] Date of Birth: 04-Mar-1965

## 2016-10-25 ENCOUNTER — Ambulatory Visit: Payer: 59 | Attending: Neurological Surgery | Admitting: Speech Pathology

## 2016-10-25 DIAGNOSIS — R4701 Aphasia: Secondary | ICD-10-CM | POA: Insufficient documentation

## 2016-10-25 NOTE — Therapy (Signed)
[facility] [address] [place], [place], [address] Phone: [phone]   Fax:  [phone]  Speech Language Pathology Treatment  Patient Details  Name: Kyle Chan MRN: [id number] Date of Birth: 02-Feb-1965 Referring Provider: Eustace Moore, MD  Encounter Date: 10/25/2016      End of Session - 10/25/16 0934    Visit Number 5   Number of Visits 9   Date for SLP Re-Evaluation 11/11/16   SLP Start Time 0845   SLP Stop Time  0928   SLP Time Calculation (min) 43 min   Activity Tolerance Patient tolerated treatment well      Past Medical History:  Diagnosis Date  . 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 History of cerebral meningioma    S/P  RESECTION 03-03-2016---  CAUSED EXPRESSIVE APHASIA  AND THIS CONTINUES  . Hypertension   . Lower urinary tract symptoms (LUTS)    rapaflo has helped symptoms  . Poor historian    due to expressive aphasia/  congitive impairment  . Wears dentures    upper  . Wears glasses     Past Surgical History:  Procedure Laterality Date  . CRANIOTOMY Left 03/03/2016   Procedure: LEFT CRANIOTOMY FOR BRAIN TUMOR;  Surgeon: Eustace Moore, MD;  Location: [facility];  Service: Neurosurgery;  Laterality: Left;  . CRANIOTOMY Left 03/21/2016   Procedure: IRRIGATION AND DEBRIDMENT OF CRANIOTOMY  AND EXPLORATION OF WOUND;  Surgeon: Eustace Moore, MD;  Location: [facility];  Service: Neurosurgery;  Laterality: Left;  IRRIGATION AND DEBRIDMENT OF CRANIOTOMY  AND EXPLORATION OF WOUND  . NASAL RECONSTRUCTION WITH SEPTAL REPAIR N/A 11/18/2012   Procedure: NASAL RECONSTRUCTION WITH SEPTAL REPAIR;  Surgeon: Melida Quitter, MD;  Location: [facility];  Service: ENT;  Laterality: N/A;  . PENILE BIOPSY N/A 07/11/2016   Procedure: EXCISION OF PENILE LESION;  Surgeon: Kathie Rhodes, MD;  Location: [facility];  Service: Urology;  Laterality: N/A;     There were no vitals filed for this visit.      Subjective Assessment - 10/25/16 0852    Subjective "I had to think about it" re: difficulty of homework   Currently in Pain? No/denies               ADULT SLP TREATMENT - 10/25/16 0855      General Information   Behavior/Cognition Alert;Cooperative;Pleasant mood     Treatment Provided   Treatment provided Cognitive-Linquistic     Pain Assessment   Pain Assessment No/denies pain     Cognitive-Linquistic Treatment   Treatment focused on Aphasia   Skilled Treatment Pt completed homework accurately - phrase completion and hononyms. Facilitated written expression generating sentences intially with one given word, advancing to 2 specified words with extnded time, occasional min questioning cues. As task progressed, pt generated more grammatically complex sentences. He identified and self corrected spelling errors with rare min A. Mod A for grammatical errors - verb tenses. Facilitated verbal expression and compensations for anomia generating 3-4 descriptions of an object/animal for ST to guess with extended time, usual mod questioning cues. Convergent naming of mid frequency nouns with usual min 1st letter and phonemic cues.      Assessment / Recommendations / Plan   Plan Continue with current plan of care     Progression Toward Goals   Progression toward goals Progressing toward goals  SLP Long Term Goals - 10/25/16 0934      SLP LONG TERM GOAL #1   Title pt will write 2-3 sentence notes/emails with modified independence   Time 2   Period Weeks   Status On-going     SLP LONG TERM GOAL #2   Title pt will demo understanding of functional 2-3 sentence paragraphs (emails, etc) with rare min A    Time 2   Period Weeks   Status On-going     SLP LONG TERM GOAL #3   Title pt will use multimodal communication PRN when verbal communication breaks down   Time 2   Period Weeks   Status On-going     SLP  LONG TERM GOAL #4   Title pt will demo [facility] speech/language in 5 minutes mod complex conversation   Time 2   Period Weeks   Status On-going     SLP LONG TERM GOAL #5   Title pt will demo [facility] auditory comprehension with modified independence in 5 minutes mod complex conversation   Time 2   Period Weeks   Status On-going          Plan - 10/25/16 4967    Clinical Impression Statement Pt continues with expressive and receptive aphasia - written expression at simple sentence level with occasional min A, spelling and grammar errors. Abstract language required mod questioning cues. Convergent naming with mid frequency objects with occasional min phonemic or 1st letter cues. Continue skilled ST to maximize communication for possible return to work. Pt is making progress, consider extended skilled ST if motivation and progress continue.    Speech Therapy Frequency 2x / week   Duration 4 weeks   Treatment/Interventions Language facilitation;Environmental controls;Multimodal communcation approach;Internal/external aids;SLP instruction and feedback;Functional tasks   Potential to Achieve Goals Good   Potential Considerations Previous level of function      Patient will benefit from skilled therapeutic intervention in order to improve the following deficits and impairments:   Aphasia    Problem List Patient Active Problem List   Diagnosis Date Noted  . Insomnia 10/10/2016  . Expressive aphasia 10/10/2016  . Fever 03/17/2016  . S/P craniotomy 03/03/2016  . Brain mass 03/02/2016    [place], Annye Rusk MS, CCC-SLP 10/25/2016, 9:35 AM  [facility] [address] [place], [place], [address] Phone: [phone]   Fax:  [phone]   Name: Kyle Chan MRN: [id number] Date of Birth: June 30, 1965

## 2016-10-27 ENCOUNTER — Ambulatory Visit: Payer: 59 | Admitting: Speech Pathology

## 2016-10-27 DIAGNOSIS — R4701 Aphasia: Secondary | ICD-10-CM

## 2016-10-27 NOTE — Therapy (Signed)
[facility] [address] [place], [place], [address] Phone: [phone]   Fax:  [phone]  Speech Language Pathology Treatment  Patient Details  Name: Kyle Chan MRN: [id number] Date of Birth: [birth year] Referring Provider: Eustace Moore, MD  Encounter Date: 10/27/2016      End of Session - 10/27/16 1159    Visit Number 6   Number of Visits 9   Date for SLP Re-Evaluation 11/11/16   SLP Start Time 0846   SLP Stop Time  0929   SLP Time Calculation (min) 43 min   Activity Tolerance Patient tolerated treatment well      Past Medical History:  Diagnosis Date  . 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 History of cerebral meningioma    S/P  RESECTION 03-03-2016---  CAUSED EXPRESSIVE APHASIA  AND THIS CONTINUES  . Hypertension   . Lower urinary tract symptoms (LUTS)    rapaflo has helped symptoms  . Poor historian    due to expressive aphasia/  congitive impairment  . Wears dentures    upper  . Wears glasses     Past Surgical History:  Procedure Laterality Date  . CRANIOTOMY Left 03/03/2016   Procedure: LEFT CRANIOTOMY FOR BRAIN TUMOR;  Surgeon: Eustace Moore, MD;  Location: [facility];  Service: Neurosurgery;  Laterality: Left;  . CRANIOTOMY Left 03/21/2016   Procedure: IRRIGATION AND DEBRIDMENT OF CRANIOTOMY  AND EXPLORATION OF WOUND;  Surgeon: Eustace Moore, MD;  Location: [facility];  Service: Neurosurgery;  Laterality: Left;  IRRIGATION AND DEBRIDMENT OF CRANIOTOMY  AND EXPLORATION OF WOUND  . NASAL RECONSTRUCTION WITH SEPTAL REPAIR N/A 11/18/2012   Procedure: NASAL RECONSTRUCTION WITH SEPTAL REPAIR;  Surgeon: Melida Quitter, MD;  Location: [facility];  Service: ENT;  Laterality: N/A;  . PENILE BIOPSY N/A 07/11/2016   Procedure: EXCISION OF PENILE LESION;  Surgeon: Kathie Rhodes, MD;  Location: [facility];  Service: Urology;  Laterality: N/A;     There were no vitals filed for this visit.      Subjective Assessment - 10/27/16 0848    Subjective "It was for her on Tuesday." (re: "When was your last appointment?")   Currently in Pain? Yes   Pain Score 4    Pain Location Head   Pain Orientation Left   Pain Descriptors / Indicators Aching   Pain Onset Yesterday   Aggravating Factors  unknown   Pain Relieving Factors tylenol   Multiple Pain Sites No               ADULT SLP TREATMENT - 10/27/16 0850      General Information   Behavior/Cognition Alert;Cooperative;Pleasant mood   Patient Positioning Upright in bed   Oral care provided N/A     Treatment Provided   Treatment provided Cognitive-Linquistic     Cognitive-Linquistic Treatment   Treatment focused on Aphasia   Skilled Treatment Pt completed several pages of homework; requesting SLP assistance with more complex sentence structures. Required occasional mod A to identify correct verb tenses, particularly for conditional sentences, past/future tenses. Targeted generating simple sentences describing pictures; pt required occasional min A to generate similar sentences in past/future tenses.      Assessment / Recommendations / Plan   Plan Continue with current plan of care     Progression Toward Goals   Progression toward goals Progressing toward goals  SLP Long Term Goals - 10/27/16 1201      SLP LONG TERM GOAL #1   Title pt will write 2-3 sentence notes/emails with modified independence   Time 2   Period Weeks   Status On-going     SLP LONG TERM GOAL #2   Title pt will demo understanding of functional 2-3 sentence paragraphs (emails, etc) with rare min A    Time 2   Period Weeks   Status On-going     SLP LONG TERM GOAL #3   Title pt will use multimodal communication PRN when verbal communication breaks down   Time 2   Period Weeks   Status On-going     SLP LONG TERM GOAL #4   Title pt will demo [facility] speech/language in 5  minutes mod complex conversation   Time 2   Period Weeks     SLP LONG TERM GOAL #5   Title pt will demo [facility] auditory comprehension with modified independence in 5 minutes mod complex conversation   Time 2   Period Weeks   Status On-going          Plan - 10/27/16 1159    Clinical Impression Statement Pt continues with expressive and receptive aphasia - written expression at simple sentence level with occasional min A, spelling and grammar errors. Occasional mod A for conditional sentences, past/future tense. Continue skilled ST to maximize communication for possible return to work. Pt is making progress, consider extended skilled ST if motivation and progress continue.    Speech Therapy Frequency 2x / week   Duration 4 weeks  consider extending additional 4 weeks   Treatment/Interventions Language facilitation;Environmental controls;Multimodal communcation approach;Internal/external aids;SLP instruction and feedback;Functional tasks   Potential to Achieve Goals Good   Potential Considerations Previous level of function   SLP Home Exercise Plan reviewed and provided   Consulted and Agree with Plan of Care Patient      Patient will benefit from skilled therapeutic intervention in order to improve the following deficits and impairments:   Aphasia    Problem List Patient Active Problem List   Diagnosis Date Noted  . Insomnia 10/10/2016  . Expressive aphasia 10/10/2016  . Fever 03/17/2016  . S/P craniotomy 03/03/2016  . Brain mass 03/02/2016   Deneise Lever, [place], [place] E Yaelis Scharfenberg 10/27/2016, 12:02 PM  [facility] [address] [place], [place], [address] Phone: [phone]   Fax:  [phone]   Name: ZEB RAWL MRN: [id number] Date of Birth: [birth year]

## 2016-11-02 ENCOUNTER — Ambulatory Visit: Payer: 59 | Admitting: Speech Pathology

## 2016-11-03 ENCOUNTER — Ambulatory Visit: Payer: 59 | Admitting: Speech Pathology

## 2016-11-07 ENCOUNTER — Encounter: Payer: Commercial Managed Care - HMO | Admitting: Speech Pathology

## 2016-11-14 ENCOUNTER — Ambulatory Visit: Payer: 59 | Admitting: Speech Pathology

## 2016-11-14 ENCOUNTER — Encounter: Payer: Commercial Managed Care - HMO | Admitting: Speech Pathology

## 2016-11-14 DIAGNOSIS — R4701 Aphasia: Secondary | ICD-10-CM

## 2016-11-14 NOTE — Therapy (Signed)
[facility] [address] [place], [place], [address] Phone: [phone]   Fax:  [phone]  Speech Language Pathology Treatment  Patient Details  Name: Kyle Chan MRN: [id number] Date of Birth: [birth year] Referring Provider: Eustace Moore, MD  Encounter Date: 11/14/2016      End of Session - 11/14/16 1814    Visit Number 7   Number of Visits 9   Date for SLP Re-Evaluation 11/11/16   SLP Start Time 1016   SLP Stop Time  1100   SLP Time Calculation (min) 44 min   Activity Tolerance Patient tolerated treatment well      Past Medical History:  Diagnosis Date  . 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 History of cerebral meningioma    S/P  RESECTION 03-03-2016---  CAUSED EXPRESSIVE APHASIA  AND THIS CONTINUES  . Hypertension   . Lower urinary tract symptoms (LUTS)    rapaflo has helped symptoms  . Poor historian    due to expressive aphasia/  congitive impairment  . Wears dentures    upper  . Wears glasses     Past Surgical History:  Procedure Laterality Date  . CRANIOTOMY Left 03/03/2016   Procedure: LEFT CRANIOTOMY FOR BRAIN TUMOR;  Surgeon: Eustace Moore, MD;  Location: [facility];  Service: Neurosurgery;  Laterality: Left;  . CRANIOTOMY Left 03/21/2016   Procedure: IRRIGATION AND DEBRIDMENT OF CRANIOTOMY  AND EXPLORATION OF WOUND;  Surgeon: Eustace Moore, MD;  Location: [facility];  Service: Neurosurgery;  Laterality: Left;  IRRIGATION AND DEBRIDMENT OF CRANIOTOMY  AND EXPLORATION OF WOUND  . NASAL RECONSTRUCTION WITH SEPTAL REPAIR N/A 11/18/2012   Procedure: NASAL RECONSTRUCTION WITH SEPTAL REPAIR;  Surgeon: Melida Quitter, MD;  Location: [facility];  Service: ENT;  Laterality: N/A;  . PENILE BIOPSY N/A 07/11/2016   Procedure: EXCISION OF PENILE LESION;  Surgeon: Kathie Rhodes, MD;  Location: [facility];  Service: Urology;  Laterality: N/A;     There were no vitals filed for this visit.      Subjective Assessment - 11/14/16 1024    Subjective Pt, wife, request decrease in frequency due to financial burden.   Patient is accompained by: Family member   Special Tests wife   Currently in Pain? No/denies               ADULT SLP TREATMENT - 11/14/16 1025      General Information   Behavior/Cognition Alert;Cooperative;Pleasant mood   Patient Positioning Upright in chair   Oral care provided N/A     Treatment Provided   Treatment provided Cognitive-Linquistic     Pain Assessment   Pain Assessment No/denies pain     Cognitive-Linquistic Treatment   Treatment focused on Aphasia   Skilled Treatment Pt returns after missing 2 appointments (1 cancellation, 1 no-show). He and his wife report they would like to decrease frequency due to financial burden. Provided education re: possibilities for financial assistance, community resources as pt's language impairment continues to hinder him from performing his job effectively. Today focused on multisentence reading comprehension in functional context. SLP showed pt website for Vocational Rehabilitation. Pt read short paragraphs re: purposes of vocational rehab and elligibility requirements, 80% accuracy. Pt required occasional mod A for comprehension, including questioning cues.       Assessment / Recommendations / Plan   Plan Continue with current plan of care     Progression  Toward Goals   Progression toward goals Progressing toward goals          SLP Education - 11/14/16 1814    Education provided Yes   Education Details vocational rehabilitation   Person(s) Educated Patient;Spouse   Methods Explanation   Comprehension Verbalized understanding            SLP Long Term Goals - 11/14/16 1817      SLP LONG TERM GOAL #1   Title pt will write 2-3 sentence notes/emails with modified independence   Time 2   Period Weeks   Status On-going     SLP LONG TERM  GOAL #2   Title pt will demo understanding of functional 2-3 sentence paragraphs (emails, etc) with rare min A      SLP LONG TERM GOAL #3   Title pt will use multimodal communication PRN when verbal communication breaks down   Time 2   Period Weeks   Status Achieved     SLP LONG TERM GOAL #4   Title pt will demo [facility] speech/language in 5 minutes mod complex conversation   Time 2   Period Weeks   Status Achieved     SLP LONG TERM GOAL #5   Title pt will demo [facility] auditory comprehension with modified independence in 5 minutes mod complex conversation   Time 2   Period Weeks   Status On-going     Additional Long Term Goals   Additional Long Term Goals Yes     SLP LONG TERM GOAL #6   Title Pt will verbalize understanding of community resources for aphasia and vocational rehabilitation.    Time 2   Period Weeks   Status On-going          Plan - 11/14/16 1815    Clinical Impression Statement Pt continues with expressive and receptive aphasia - written expression at simple sentence level with occasional min A, spelling and grammar errors. Occasional mod A for conditional sentences, past/future tense. Continue skilled ST to maximize communication for possible return to work. Pt is making progress, however frequency will be reduced to 1x per week at pt request due to financial burden. Goal added for community resource education.   Speech Therapy Frequency 1x /week   Duration 2 weeks   Treatment/Interventions Language facilitation;Environmental controls;Multimodal communcation approach;Internal/external aids;SLP instruction and feedback;Functional tasks   Potential to Achieve Goals Good   Potential Considerations Financial resources   SLP Home Exercise Plan reviewed and provided   Consulted and Agree with Plan of Care Patient      Patient will benefit from skilled therapeutic intervention in order to improve the following deficits and impairments:   Aphasia    Problem  List Patient Active Problem List   Diagnosis Date Noted  . Insomnia 10/10/2016  . Expressive aphasia 10/10/2016  . Fever 03/17/2016  . S/P craniotomy 03/03/2016  . Brain mass 03/02/2016   Deneise Lever, [place], [place] Speech-Language Pathologist  Aliene Altes 11/14/2016, 6:20 PM  [facility] [address] [place], [place], [address] Phone: [phone]   Fax:  [phone]   Name: Kyle Chan MRN: [id number] Date of Birth: 11-17-1965

## 2016-11-14 NOTE — Patient Instructions (Signed)
[email or web address]  Vocational Rehab office:  [place] [phone] [address] [place] [phone]

## 2016-11-17 ENCOUNTER — Encounter: Payer: 59 | Admitting: Speech Pathology

## 2016-11-21 ENCOUNTER — Encounter: Payer: 59 | Admitting: Speech Pathology

## 2016-11-24 ENCOUNTER — Encounter: Payer: 59 | Admitting: Speech Pathology

## 2016-11-28 ENCOUNTER — Encounter: Payer: 59 | Admitting: Speech Pathology

## 2016-12-01 ENCOUNTER — Ambulatory Visit: Payer: 59 | Admitting: Speech Pathology

## 2016-12-05 ENCOUNTER — Encounter: Payer: 59 | Admitting: Speech Pathology

## 2016-12-08 ENCOUNTER — Encounter: Payer: 59 | Admitting: Speech Pathology

## 2016-12-12 ENCOUNTER — Encounter: Payer: 59 | Attending: Physical Medicine & Rehabilitation | Admitting: Physical Medicine & Rehabilitation

## 2016-12-12 DIAGNOSIS — Z9889 Other specified postprocedural states: Secondary | ICD-10-CM | POA: Insufficient documentation

## 2016-12-12 DIAGNOSIS — I10 Essential (primary) hypertension: Secondary | ICD-10-CM | POA: Insufficient documentation

## 2016-12-12 DIAGNOSIS — G4700 Insomnia, unspecified: Secondary | ICD-10-CM | POA: Insufficient documentation

## 2016-12-12 DIAGNOSIS — Z87891 Personal history of nicotine dependence: Secondary | ICD-10-CM | POA: Insufficient documentation

## 2016-12-12 DIAGNOSIS — Z86011 Personal history of benign neoplasm of the brain: Secondary | ICD-10-CM | POA: Insufficient documentation

## 2016-12-12 DIAGNOSIS — R4701 Aphasia: Secondary | ICD-10-CM | POA: Insufficient documentation

## 2017-03-10 DIAGNOSIS — D329 Benign neoplasm of meninges, unspecified: Secondary | ICD-10-CM | POA: Diagnosis not present

## 2017-03-14 DIAGNOSIS — D329 Benign neoplasm of meninges, unspecified: Secondary | ICD-10-CM | POA: Diagnosis not present

## 2017-03-21 DIAGNOSIS — D329 Benign neoplasm of meninges, unspecified: Secondary | ICD-10-CM | POA: Diagnosis not present

## 2017-03-21 DIAGNOSIS — Z6837 Body mass index (BMI) 37.0-37.9, adult: Secondary | ICD-10-CM | POA: Diagnosis not present

## 2017-03-21 DIAGNOSIS — I10 Essential (primary) hypertension: Secondary | ICD-10-CM | POA: Diagnosis not present

## 2017-03-27 ENCOUNTER — Encounter: Payer: Self-pay | Admitting: Speech Pathology

## 2017-03-27 NOTE — Therapy (Signed)
SPEECH THERAPY DISCHARGE SUMMARY  Visits from Start of Care: 7  Current functional level related to goals / functional outcomes: See goals below.   Remaining deficits: At time of last visit, pt continued to demonstrate expressive and receptive aphasia, mild-moderate in severity, impacting verbal expression, auditory and reading comprehension, writing.   Education / Equipment: Emergency planning/management officer for aphasia; vocational rehabilitation  Plan: Patient agrees to discharge.  Patient goals were partially met. Patient is being discharged due to not returning since the last visit.  ?????         SLP Long Term Goals - 03/27/17 1024      SLP LONG TERM GOAL #1   Title  pt will write 2-3 sentence notes/emails with modified independence    Status  Not Met      SLP LONG TERM GOAL #2   Title  pt will demo understanding of functional 2-3 sentence paragraphs (emails, etc) with rare min A     Status  Not Met      SLP LONG TERM GOAL #3   Title  pt will use multimodal communication PRN when verbal communication breaks down    Status  Achieved      SLP LONG TERM GOAL #4   Title  pt will demo [facility] speech/language in 5 minutes mod complex conversation    Status  Achieved      SLP LONG TERM GOAL #5   Title  pt will demo [facility] auditory comprehension with modified independence in 5 minutes mod complex conversation    Status  Not Met      SLP LONG TERM GOAL #6   Title  Pt will verbalize understanding of community resources for aphasia and vocational rehabilitation.     Status  Not [facility] [address] [place], [place], [address] Phone: [phone]   Fax:  [phone]  Patient Details  Name: Kyle Chan MRN: [id number] Date of Birth: Oct 09, 1965 Referring Provider:  No ref. provider found  Encounter Date: 03/27/2017  Deneise Lever, [place], [place] 03/27/2017, 10:25  AM  [facility] [address] [place], [place], [address] Phone: [phone]   Fax:  [phone]

## 2017-04-21 DIAGNOSIS — I10 Essential (primary) hypertension: Secondary | ICD-10-CM | POA: Diagnosis not present

## 2017-04-21 DIAGNOSIS — R51 Headache: Secondary | ICD-10-CM | POA: Diagnosis not present

## 2017-04-21 DIAGNOSIS — Z0000 Encounter for general adult medical examination without abnormal findings: Secondary | ICD-10-CM | POA: Diagnosis not present

## 2017-04-21 DIAGNOSIS — Z86018 Personal history of other benign neoplasm: Secondary | ICD-10-CM | POA: Diagnosis not present

## 2017-04-21 DIAGNOSIS — Z125 Encounter for screening for malignant neoplasm of prostate: Secondary | ICD-10-CM | POA: Diagnosis not present

## 2017-04-21 DIAGNOSIS — I69320 Aphasia following cerebral infarction: Secondary | ICD-10-CM | POA: Diagnosis not present

## 2017-05-15 DIAGNOSIS — Z125 Encounter for screening for malignant neoplasm of prostate: Secondary | ICD-10-CM | POA: Diagnosis not present

## 2017-05-15 DIAGNOSIS — N401 Enlarged prostate with lower urinary tract symptoms: Secondary | ICD-10-CM | POA: Diagnosis not present

## 2017-05-15 DIAGNOSIS — R351 Nocturia: Secondary | ICD-10-CM | POA: Diagnosis not present

## 2017-06-23 ENCOUNTER — Encounter: Payer: Self-pay | Admitting: *Deleted

## 2017-06-26 ENCOUNTER — Telehealth: Payer: Self-pay | Admitting: *Deleted

## 2017-06-26 ENCOUNTER — Ambulatory Visit: Payer: BLUE CROSS/BLUE SHIELD | Admitting: Neurology

## 2017-06-26 ENCOUNTER — Encounter: Payer: Self-pay | Admitting: Neurology

## 2017-06-26 VITALS — BP 156/92 | HR 62 | Ht 69.0 in | Wt 239.0 lb

## 2017-06-26 DIAGNOSIS — Z8603 Personal history of neoplasm of uncertain behavior: Secondary | ICD-10-CM

## 2017-06-26 DIAGNOSIS — Z9889 Other specified postprocedural states: Secondary | ICD-10-CM | POA: Diagnosis not present

## 2017-06-26 DIAGNOSIS — G40209 Localization-related (focal) (partial) symptomatic epilepsy and epileptic syndromes with complex partial seizures, not intractable, without status epilepticus: Secondary | ICD-10-CM | POA: Diagnosis not present

## 2017-06-26 MED ORDER — LAMOTRIGINE 100 MG PO TBDP: 100.0000 mg | ORAL_TABLET | Freq: Two times a day (BID) | ORAL | 11 refills | Status: DC

## 2017-06-26 MED ORDER — LAMOTRIGINE 25 MG PO TABS: ORAL_TABLET | ORAL | 0 refills | Status: DC

## 2017-06-26 NOTE — Telephone Encounter (Addendum)
I faxed pt CIgna form on 06/27/17 to [phone]

## 2017-06-26 NOTE — Progress Notes (Signed)
PATIENT: Kyle Chan DOB: 02/19/1965  Chief Complaint  Patient presents with  . Headache    He is here with his wife, [name].  Reports daily headaches since having surgeries for a meningioma in 02/2016.  His pain is worse at night.  He is currently taking Temazepam 7.5mg  at bedtime which has been helping him sleep.  . Memory Loss    MMSE 27/30 - 8 animals.  He has also noticed memory loss, delayed recall and confusion since his surgeries.  Marland Kitchen PCP    Antony Contras, MD     HISTORICAL  Kyle Chan is a 52 year old male, accompanied by his wife [name], seen in refer by his primary care physician Dr. Antony Contras, initial evaluation was on June 26, 2017.  He had left frontotemporal craniotomy for resection of meningioma on March 02, 2016.  He presented with sudden onset of confusion prior to the surgery,  MRI of the brain with without contrast on March 02, 2016 prior to surgery showed large left convexity meningioma with underlying mass-effect on the left frontal and temporal lobes, adjacent edema, 6.1 x 5.9 x 4.2 cm, with 10 mm rightward midline shift at the level of the foraminal of [place].  Post surgically, he was treated with Keppra 500 mg twice a day for few months, he had no recurrent seizure, Keppra was stopped.  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, he now complains of memory loss, frequent headaches, confusion, difficulty sleeping, he also has language difficulty, this including comprehensive difficulty, and expressive aphasia, he also describes episode of suddenly lapse of memory while watching TV, could not follow up that episode, there was 3 episode of sudden onset of bowel incontinence, with mild confusion,  He now complains of daily moderate headaches, bilateral frontal, oftentimes it would be exacerbated to a more severe 7 out of 10 headache lasting for few minutes, multiple episode during the day,  he also complains of intermittent right leg arm numbness, weakness  Positive surgical MRI of the brain with without contrast in May 2019 showed no residual meningioma, 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 Bee Venom Swelling    HOME MEDICATIONS: Current Outpatient Medications  Medication Sig Dispense Refill  . amLODipine (NORVASC) 10 MG tablet Take 10 mg by mouth every morning.     Marland Kitchen aspirin EC 81 MG tablet Take 81 mg by mouth daily.    . baclofen (LIORESAL) 10 MG tablet Take 10 mg by mouth 2 (two) times daily as needed for muscle spasms.     . Omega-3 Fatty Acids (OMEGA-3 FISH OIL PO) Take 1 capsule by mouth daily.    Marland Kitchen telmisartan (MICARDIS) 80 MG tablet Take 80 mg by mouth daily.  1  . temazepam (RESTORIL) 7.5 MG capsule Take 7.5 mg by mouth at bedtime as needed for sleep.    . vitamin B-12 (CYANOCOBALAMIN) 1000 MCG tablet Take 1,000 mcg by mouth daily.     No current facility-administered medications for this visit.     PAST MEDICAL HISTORY: Past Medical History:  Diagnosis Date  . Back pain   . Benign non-nodular prostatic hyperplasia with lower urinary tract symptoms   . CVA (cerebral  vascular accident) ([facility])   . 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 History of cerebral meningioma    S/P  RESECTION 03-03-2016---  CAUSED EXPRESSIVE APHASIA  AND THIS CONTINUES  . History of diverticulitis   . History of gastroesophageal reflux (GERD)   . History of meningioma   . History of penile cancer   . Hypertension   . Lower urinary tract symptoms (LUTS)    rapaflo has helped symptoms  . Memory loss   . Overactive bladder   . Poor historian     due to expressive aphasia/  congitive impairment  . 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 Wears dentures    upper  . Wears glasses     PAST SURGICAL HISTORY: Past Surgical History:  Procedure Laterality Date  . CRANIOTOMY Left 03/03/2016   Procedure: LEFT CRANIOTOMY FOR BRAIN TUMOR;  Surgeon: Eustace Moore, MD;  Location: [facility];  Service: Neurosurgery;  Laterality: Left;  . CRANIOTOMY Left 03/21/2016   Procedure: IRRIGATION AND DEBRIDMENT OF CRANIOTOMY  AND EXPLORATION OF WOUND;  Surgeon: Eustace Moore, MD;  Location: [facility];  Service: Neurosurgery;  Laterality: Left;  IRRIGATION AND DEBRIDMENT OF CRANIOTOMY  AND EXPLORATION OF WOUND  . NASAL RECONSTRUCTION WITH SEPTAL REPAIR N/A 11/18/2012   Procedure: NASAL RECONSTRUCTION WITH SEPTAL REPAIR;  Surgeon: Melida Quitter, MD;  Location: [facility];  Service: ENT;  Laterality: N/A;  . PENILE BIOPSY N/A 07/11/2016   Procedure: EXCISION OF PENILE LESION;  Surgeon: Kathie Rhodes, MD;  Location: [facility];  Service: Urology;  Laterality: N/A;    FAMILY HISTORY: Family History  Problem Relation Age of Onset  . Hypertension Mother   . Abdominal Wall Hernia Mother   . Diabetes Mother   . Colon cancer Mother   . Hypertension Father     SOCIAL HISTORY:  Social History   Socioeconomic History  . Marital status: Married    Spouse name: Not on file  . Number of children: 0  . Years of education: 43  . Highest education level: High school graduate  Occupational History  . Occupation: currently trying to get disablility  Social Needs  . Financial resource strain: Not on file  . Food insecurity:    Worry: Not on file    Inability: Not on file  . Transportation needs:    Medical: Not on file    Non-medical: Not on file  Tobacco Use  . Smoking status: Former Smoker    Years: 27.00    Types: Cigarettes    Last attempt to quit: 07/04/2008    Years since quitting: 8.9  . Smokeless tobacco: Never Used    Substance and Sexual Activity  . Alcohol use: Yes    Comment: social - one drink every 2-3 months  . Drug use: No  . Sexual activity: Not on file  Lifestyle  . Physical activity:    Days per week: Not on file    Minutes per session: Not on file  . Stress: Not on file  Relationships  . Social connections:    Talks on phone: Not on file    Gets together: Not on file    Attends religious service: Not on file    Active member of club or organization: Not on file    Attends meetings of clubs or organizations: Not on file    Relationship status: Not on file  .  Intimate partner violence:    Fear of current or ex partner: Not on file    Emotionally abused: Not on file    Physically abused: Not on file    Forced sexual activity: Not on file  Other Topics Concern  . Not on file  Social History Narrative   Lives at home with his wife.   Right-handed.   Caffeine use:  4 cups per day.     PHYSICAL EXAM   Vitals:   06/26/17 1004  BP: (!) 156/92  Pulse: 62  Weight: 239 lb (108.4 kg)  Height: 5\' 9"  (1.753 m)    Not recorded      Body mass index is 35.29 kg/m.  PHYSICAL EXAMNIATION:  Gen: NAD, conversant, well nourised, obese, well groomed                     Cardiovascular: Regular rate rhythm, no peripheral edema, warm, nontender. Eyes: Conjunctivae clear without exudates or hemorrhage Neck: Supple, no carotid bruits. Pulmonary: Clear to auscultation bilaterally   NEUROLOGICAL EXAM:  MENTAL STATUS: MMSE - Mini Mental State Exam 06/26/2017  Orientation to time 5  Orientation to Place 5  Registration 3  Attention/ Calculation 5  Recall 1  Language- name 2 objects 2  Language- repeat 1  Language- follow 3 step command 3  Language- read & follow direction 1  Write a sentence 0  Copy design 1  Total score 27  Animal naming 8,  He has difficulty following three-step commands, with finger agnosia,  CRANIAL NERVES: CN II: Visual fields are full to confrontation.  Pupils are round equal and briskly reactive to light. CN III, IV, VI: extraocular movement are normal. No ptosis. CN V: Facial sensation is intact to pinprick in all 3 divisions bilaterally. Corneal responses are intact.  CN VII: Face is symmetric with normal eye closure and smile. CN VIII: Hearing is normal to rubbing fingers CN IX, X: Palate elevates symmetrically. Phonation is normal. CN XI: Head turning and shoulder shrug are intact CN XII: Tongue is midline with normal movements and no atrophy.  MOTOR: There is no pronator drift of out-stretched arms. Muscle bulk and tone are normal. Muscle strength is normal.  REFLEXES: Reflexes are 2+ and symmetric at the biceps, triceps, knees, and ankles. Plantar responses are flexor.  SENSORY: Intact to light touch, pinprick, positional sensation and vibratory sensation are intact in fingers and toes.  COORDINATION: Rapid alternating movements and fine finger movements are intact. There is no dysmetria on finger-to-nose and heel-knee-shin.    GAIT/STANCE: Posture is normal. Gait is steady with normal steps, base, arm swing, and turning. Heel and toe walking are normal. Tandem gait is normal.  Romberg is absent.   DIAGNOSTIC DATA (LABS, IMAGING, TESTING) - I reviewed patient records, labs, notes, testing and imaging myself where available.   ASSESSMENT AND PLAN  Kyle Chan is a 52 y.o. male   Status post large sized left convexity meningioma resection in February 2018,  Continued evidence of encephalomalacia involving left temporal lobe, and left frontal orbicular  Patient was also noted to have comprehensive, expressive aphasia, finger agnosia,  He is at a high risk for partial seizure, he does reported episode of relapse of memory, even episode of bowel incontinence, with associated confusion, he also complains of daily moderate to severe headaches  Start lamotrigine titrating to 100 mg twice a day,  EEG  Complete the paperwork  for his disability.  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 Ph.D.  [facility] [address] [place], [place] [address] Ph: [phone] Fax: [phone]  FS:FSELTR, Shanon Brow, MD

## 2017-06-27 ENCOUNTER — Ambulatory Visit: Payer: BLUE CROSS/BLUE SHIELD | Admitting: Neurology

## 2017-06-27 DIAGNOSIS — G40209 Localization-related (focal) (partial) symptomatic epilepsy and epileptic syndromes with complex partial seizures, not intractable, without status epilepticus: Secondary | ICD-10-CM | POA: Diagnosis not present

## 2017-06-27 DIAGNOSIS — Z9889 Other specified postprocedural states: Secondary | ICD-10-CM

## 2017-06-27 DIAGNOSIS — Z8603 Personal history of neoplasm of uncertain behavior: Principal | ICD-10-CM

## 2017-07-07 NOTE — Procedures (Signed)
   HISTORY: 52 years old male presented with headache, memory loss.  TECHNIQUE:  16 channel EEG was performed based on standard 10-16 international system. One channel was dedicated to EKG, which has demonstrates normal sinus rhythm of 60 beats per minutes.  Upon awakening, the posterior background activity was well-developed, in alpha range, 11 Hz, reactive to eye opening and closure.  There was no evidence of epileptiform discharge.  Photic stimulation was performed, which induced a symmetric photic driving.  Hyperventilation was performed, there was no abnormality elicit.  No sleep was achieved.  CONCLUSION: This is a  normal awake EEG.  There is no electrodiagnostic evidence of epileptiform discharge.  Marcial Pacas, M.D. Ph.D.  [facility] [place], [place] [address] Phone: [phone] Fax:      [phone]

## 2017-07-11 DIAGNOSIS — Z0289 Encounter for other administrative examinations: Secondary | ICD-10-CM

## 2017-07-23 ENCOUNTER — Other Ambulatory Visit: Payer: Self-pay | Admitting: Neurology

## 2017-08-16 DIAGNOSIS — Z125 Encounter for screening for malignant neoplasm of prostate: Secondary | ICD-10-CM | POA: Diagnosis not present

## 2017-09-06 DIAGNOSIS — Z0289 Encounter for other administrative examinations: Secondary | ICD-10-CM

## 2017-09-11 ENCOUNTER — Encounter: Payer: Self-pay | Admitting: Neurology

## 2017-09-11 ENCOUNTER — Ambulatory Visit: Payer: BLUE CROSS/BLUE SHIELD | Admitting: Neurology

## 2017-09-11 VITALS — BP 155/87 | HR 68 | Ht 69.0 in | Wt 234.5 lb

## 2017-09-11 DIAGNOSIS — Z8603 Personal history of neoplasm of uncertain behavior: Secondary | ICD-10-CM | POA: Diagnosis not present

## 2017-09-11 DIAGNOSIS — G40209 Localization-related (focal) (partial) symptomatic epilepsy and epileptic syndromes with complex partial seizures, not intractable, without status epilepticus: Secondary | ICD-10-CM | POA: Diagnosis not present

## 2017-09-11 DIAGNOSIS — Z9889 Other specified postprocedural states: Secondary | ICD-10-CM

## 2017-09-11 MED ORDER — LAMOTRIGINE 100 MG PO TABS: 100.0000 mg | ORAL_TABLET | Freq: Two times a day (BID) | ORAL | 4 refills | Status: DC

## 2017-09-11 NOTE — Progress Notes (Signed)
PATIENT: Kyle Chan DOB: 1965-11-08  Chief Complaint  Patient presents with  . Seizures/Headaches    He is here with his wife, Kyle Chan.  Says he is doing well Lamictal 100mg , BID.  No further issues with headaches.     HISTORICAL  Kyle Chan is a 52 year old male, accompanied by his wife Kyle Chan, seen in refer by his primary care physician Dr. Antony Chan, initial evaluation was on June 26, 2017.  He had left frontotemporal craniotomy for resection of meningioma on March 02, 2016.  He presented with sudden onset of confusion prior to the surgery,  MRI of the brain with without contrast on March 02, 2016 prior to surgery showed large left convexity meningioma with underlying mass-effect on the left frontal and temporal lobes, adjacent edema, 6.1 x 5.9 x 4.2 cm, with 10 mm rightward midline shift at the level of the foraminal of [place].  Post surgically, he was treated with Keppra 500 mg twice a day for few months, he had no recurrent seizure, Keppra was stopped.  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, he now complains of memory loss, frequent headaches, confusion, difficulty sleeping, he also has language difficulty, this including comprehensive difficulty, and expressive aphasia, he also describes episode of suddenly lapse of memory while watching TV, could not follow up that episode, there was 3 episode of sudden onset of bowel incontinence, with mild confusion,  He now complains of daily moderate headaches, bilateral frontal, oftentimes it would be exacerbated to a more severe 7 out of 10 headache lasting for few minutes, multiple episode during the day, he also complains of intermittent right leg arm numbness, weakness  Positive surgical MRI of the brain with without contrast in May 2019 showed no residual meningioma, encephalomalacia involving left lateral temporal lobe and left frontal  orbiculum,  UPDATE September 11 2017: He is very happy with lamotrigine 100 mg twice a day, no longer has headaches, no clinical seizure noted, And there was no significant side effect noted.  REVIEW OF SYSTEMS: Full 14 system review of systems performed and notable only for as above  ALLERGIES: Allergies  Allergen Reactions  . Bee Venom Swelling    HOME MEDICATIONS: Current Outpatient Medications  Medication Sig Dispense Refill  . amLODipine (NORVASC) 10 MG tablet Take 10 mg by mouth every morning.     Marland Kitchen aspirin EC 81 MG tablet Take 81 mg by mouth daily.    . baclofen (LIORESAL) 10 MG tablet Take 10 mg by mouth 2 (two) times daily as needed for muscle spasms.     Marland Kitchen lamoTRIgine 100 MG TBDP Take 1 tablet (100 mg total) by mouth 2 (two) times daily. 60 tablet 11  . Omega-3 Fatty Acids (OMEGA-3 FISH OIL PO) Take 1 capsule by mouth daily.    Marland Kitchen telmisartan (MICARDIS) 80 MG tablet Take 80 mg by mouth daily.  1  . temazepam (RESTORIL) 7.5 MG capsule Take 7.5 mg by mouth at bedtime as needed for sleep.    . vitamin B-12 (CYANOCOBALAMIN) 1000 MCG tablet Take 1,000 mcg by mouth daily.     No current facility-administered medications for this visit.     PAST MEDICAL HISTORY: Past Medical History:  Diagnosis Date  . Back pain   . Benign non-nodular prostatic hyperplasia with lower urinary tract symptoms   . CVA (cerebral vascular accident) ([place])   . Expressive aphasia  confusion/cognitive due to left frontal meningioma s/p  resection 03-03-2016  . History of cerebral meningioma    S/P  RESECTION 03-03-2016---  CAUSED EXPRESSIVE APHASIA  AND THIS CONTINUES  . History of diverticulitis   . History of gastroesophageal reflux (GERD)   . History of meningioma   . History of penile cancer   . Hypertension   . Lower urinary tract symptoms (LUTS)    rapaflo has helped symptoms  . Memory loss   . Overactive bladder   . Poor historian    due to expressive aphasia/  congitive impairment  .  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 Wears dentures    upper  . Wears glasses     PAST SURGICAL HISTORY: Past Surgical History:  Procedure Laterality Date  . CRANIOTOMY Left 03/03/2016   Procedure: LEFT CRANIOTOMY FOR BRAIN TUMOR;  Surgeon: Kyle Moore, MD;  Location: [facility];  Service: Neurosurgery;  Laterality: Left;  . CRANIOTOMY Left 03/21/2016   Procedure: IRRIGATION AND DEBRIDMENT OF CRANIOTOMY  AND EXPLORATION OF WOUND;  Surgeon: Kyle Moore, MD;  Location: [facility];  Service: Neurosurgery;  Laterality: Left;  IRRIGATION AND DEBRIDMENT OF CRANIOTOMY  AND EXPLORATION OF WOUND  . NASAL RECONSTRUCTION WITH SEPTAL REPAIR N/A 11/18/2012   Procedure: NASAL RECONSTRUCTION WITH SEPTAL REPAIR;  Surgeon: Kyle Quitter, MD;  Location: [facility];  Service: ENT;  Laterality: N/A;  . PENILE BIOPSY N/A 07/11/2016   Procedure: EXCISION OF PENILE LESION;  Surgeon: Kyle Rhodes, MD;  Location: [facility];  Service: Urology;  Laterality: N/A;    FAMILY HISTORY: Family History  Problem Relation Age of Onset  . Hypertension Mother   . Abdominal Wall Hernia Mother   . Diabetes Mother   . Colon cancer Mother   . Hypertension Father     SOCIAL HISTORY:  Social History   Socioeconomic History  . Marital status: Married    Spouse name: Not on file  . Number of children: 0  . Years of education: 40  . Highest education level: High school graduate  Occupational History  . Occupation: currently trying to get disablility  Social Needs  . Financial resource strain: Not on file  . Food insecurity:    Worry: Not on file    Inability: Not on file  . Transportation needs:    Medical: Not on file    Non-medical: Not on file  Tobacco Use  . Smoking status: Former Smoker    Years: 27.00    Types: Cigarettes    Last attempt to quit: 07/04/2008    Years since quitting: 9.1  . Smokeless tobacco: Never Used  Substance and Sexual Activity  . Alcohol use: Yes     Comment: social - one drink every 2-3 months  . Drug use: No  . Sexual activity: Not on file  Lifestyle  . Physical activity:    Days per week: Not on file    Minutes per session: Not on file  . Stress: Not on file  Relationships  . Social connections:    Talks on phone: Not on file    Gets together: Not on file    Attends religious service: Not on file    Active member of club or organization: Not on file    Attends meetings of clubs or organizations: Not on file    Relationship status: Not on file  . Intimate partner violence:    Fear of current or ex  partner: Not on file    Emotionally abused: Not on file    Physically abused: Not on file    Forced sexual activity: Not on file  Other Topics Concern  . Not on file  Social History Narrative   Lives at home with his wife.   Right-handed.   Caffeine use:  4 cups per day.     PHYSICAL EXAM   Vitals:   09/11/17 1003  BP: (!) 155/87  Pulse: 68  Weight: 234 lb 8 oz (106.4 kg)  Height: 5\' 9"  (1.753 m)    Not recorded      Body mass index is 34.63 kg/m.  PHYSICAL EXAMNIATION:  Gen: NAD, conversant, well nourised, obese, well groomed                     Cardiovascular: Regular rate rhythm, no peripheral edema, warm, nontender. Eyes: Conjunctivae clear without exudates or hemorrhage Neck: Supple, no carotid bruits. Pulmonary: Clear to auscultation bilaterally   NEUROLOGICAL EXAM:  MENTAL STATUS: MMSE - Mini Mental State Exam 06/26/2017  Orientation to time 5  Orientation to Place 5  Registration 3  Attention/ Calculation 5  Recall 1  Language- name 2 objects 2  Language- repeat 1  Language- follow 3 step command 3  Language- read & follow direction 1  Write a sentence 0  Copy design 1  Total score 27  Animal naming 8,  He follows two-step commands without any difficulty  CRANIAL NERVES: CN II: Visual fields are full to confrontation. Pupils are round equal and briskly reactive to light. CN III, IV, VI:  extraocular movement are normal. No ptosis. CN V: Facial sensation is intact to pinprick in all 3 divisions bilaterally. Corneal responses are intact.  CN VII: Face is symmetric with normal eye closure and smile. CN VIII: Hearing is normal to rubbing fingers CN IX, X: Palate elevates symmetrically. Phonation is normal. CN XI: Head turning and shoulder shrug are intact CN XII: Tongue is midline with normal movements and no atrophy.  MOTOR: There is no pronator drift of out-stretched arms. Muscle bulk and tone are normal. Muscle strength is normal.  REFLEXES: Reflexes are 2+ and symmetric at the biceps, triceps, knees, and ankles. Plantar responses are flexor.  SENSORY: Intact to light touch, pinprick, positional sensation and vibratory sensation are intact in fingers and toes.  COORDINATION: Rapid alternating movements and fine finger movements are intact. There is no dysmetria on finger-to-nose and heel-knee-shin.    GAIT/STANCE: Posture is normal. Gait is steady with normal steps, base, arm swing, and turning. Heel and toe walking are normal. Tandem gait is normal.  Romberg is absent.   DIAGNOSTIC DATA (LABS, IMAGING, TESTING) - I reviewed patient records, labs, notes, testing and imaging myself where available.   ASSESSMENT AND PLAN  KIJUAN GALLICCHIO is a 52 y.o. male   Status post large sized left convexity meningioma resection in February 2018,  Evidence of encephalomalacia involving left temporal lobe, and left frontal orbicular  He is at a high risk for partial seizure, he does reported episode of relapse of memory, even episode of bowel incontinence, with associated confusion, he also complains of daily moderate to severe headaches  Responded very well to lamotrigine titrating to 100 mg twice a day,  EEG was normal in June 2019    Marcial Pacas, M.D. Ph.D.  [facility] [address], [place], [place] [address] Ph: [phone] Fax:  [phone]  LP:[id number], Kyle Brow, MD

## 2017-10-10 ENCOUNTER — Other Ambulatory Visit: Payer: Self-pay | Admitting: Physical Medicine & Rehabilitation

## 2017-10-27 DIAGNOSIS — I69320 Aphasia following cerebral infarction: Secondary | ICD-10-CM | POA: Diagnosis not present

## 2017-10-27 DIAGNOSIS — I10 Essential (primary) hypertension: Secondary | ICD-10-CM | POA: Diagnosis not present

## 2017-10-27 DIAGNOSIS — Z86018 Personal history of other benign neoplasm: Secondary | ICD-10-CM | POA: Diagnosis not present

## 2017-10-27 DIAGNOSIS — Z23 Encounter for immunization: Secondary | ICD-10-CM | POA: Diagnosis not present

## 2017-10-27 DIAGNOSIS — G40209 Localization-related (focal) (partial) symptomatic epilepsy and epileptic syndromes with complex partial seizures, not intractable, without status epilepticus: Secondary | ICD-10-CM | POA: Diagnosis not present

## 2017-12-14 IMAGING — DX DG CHEST 2V
2 series · 2 of 2 positions shown · non-contrast
Comparison: None.

CLINICAL DATA: 50-year-old male with fever.

EXAM:
CHEST  2 VIEW

[chest lat]
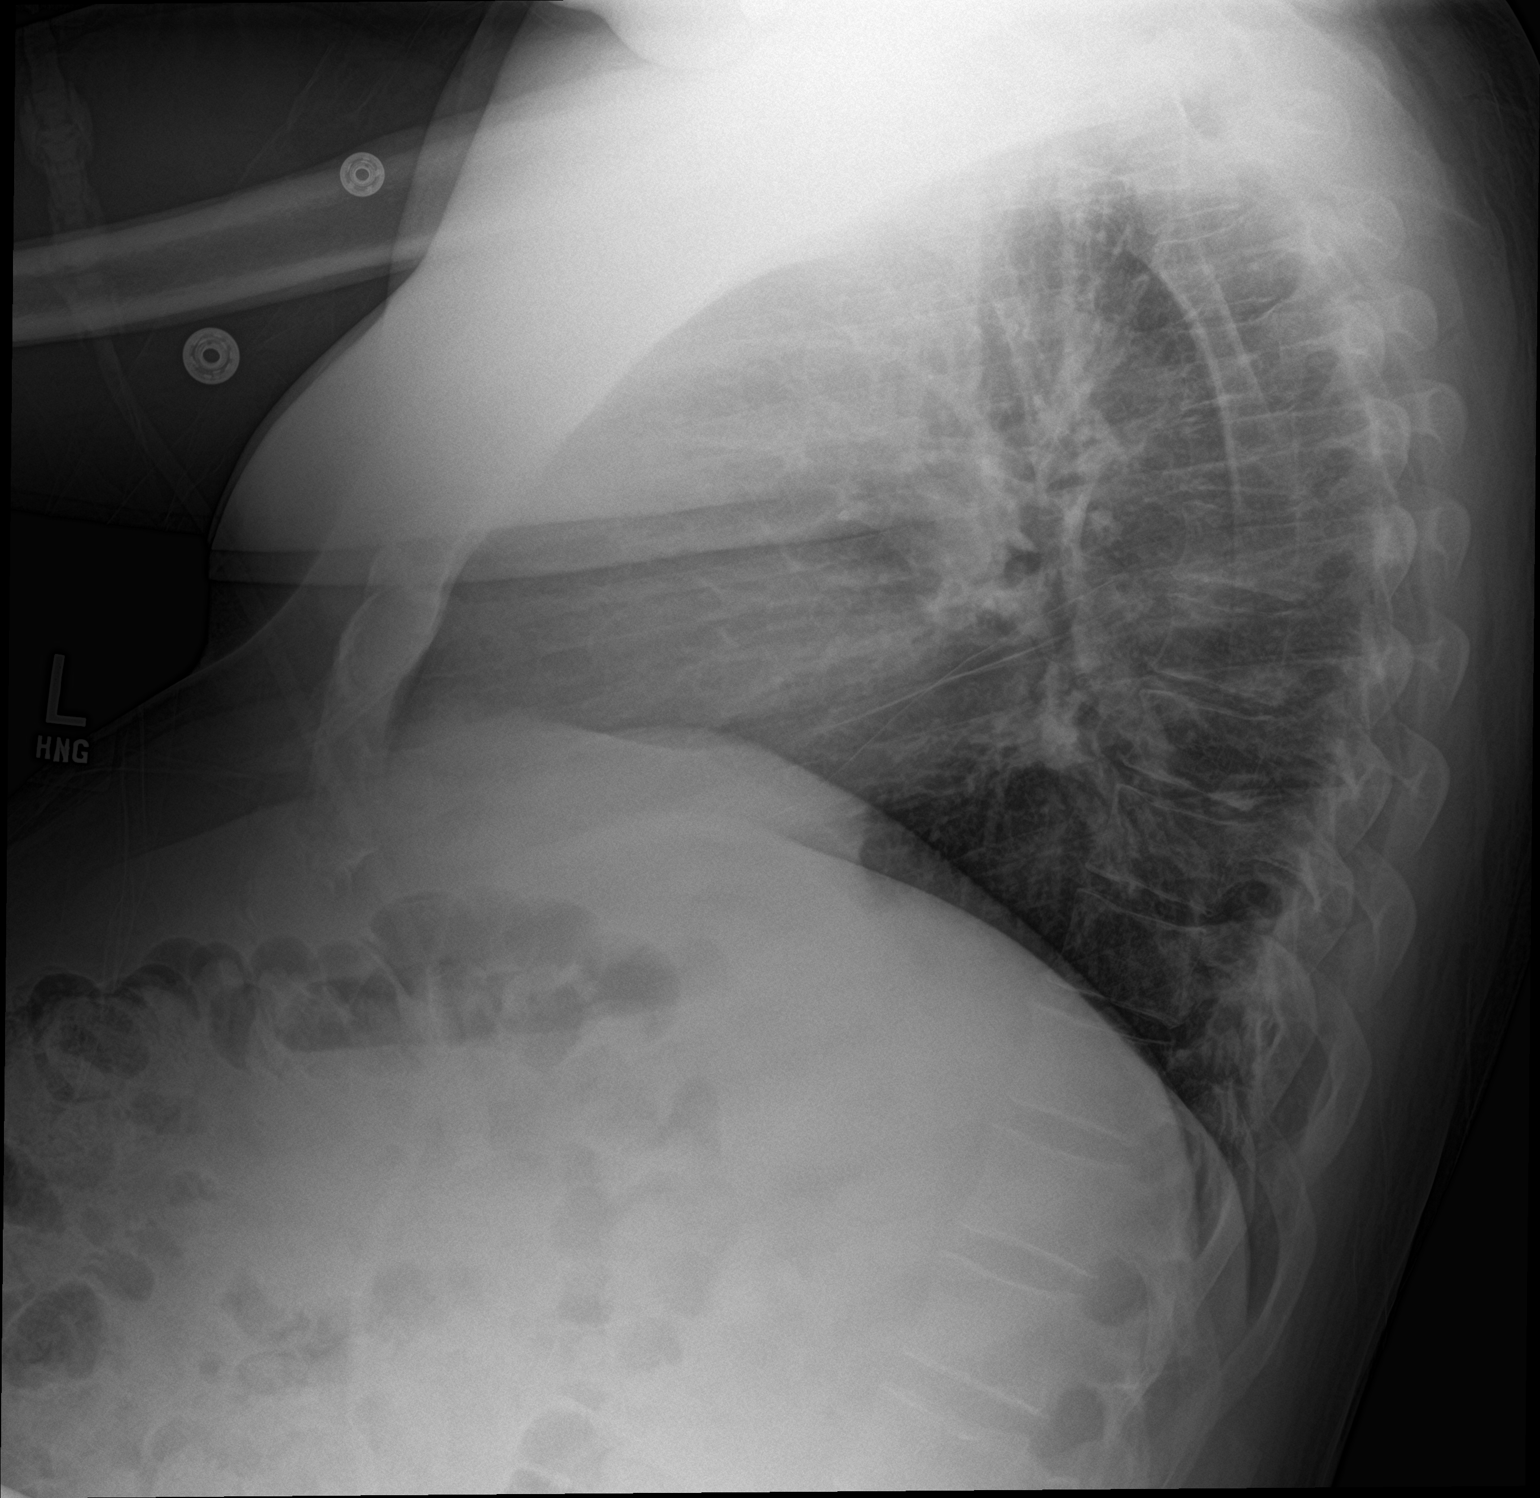

[chest ap]
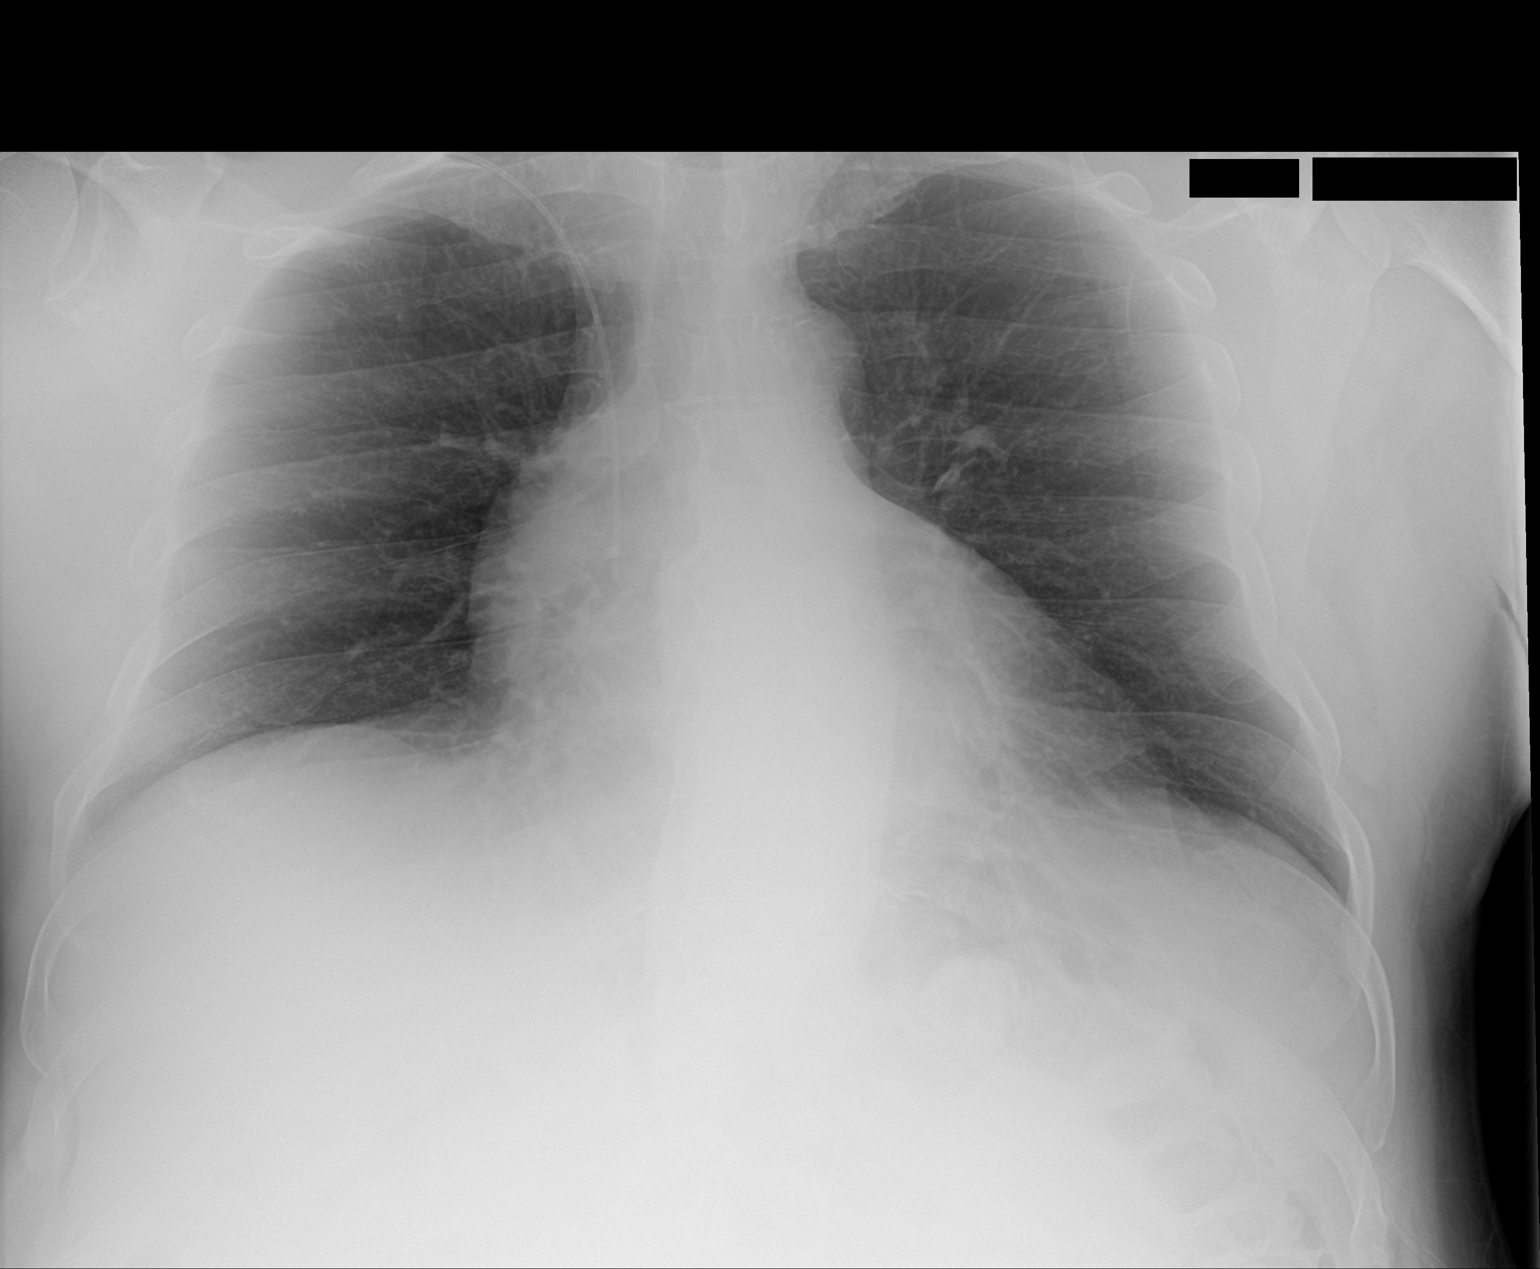

[2 of 2 positions shown; findings below may reference images not displayed]

FINDINGS: There is shallow inspiration. The lungs are clear. No pleural
effusion or pneumothorax. The cardiac silhouette is within normal
limits. Right-sided PICC with tip over right atrial silhouette. No
acute osseous pathology.
IMPRESSION: No active cardiopulmonary disease.

Right-sided PICC with tip over the right atrium.

## 2018-03-14 ENCOUNTER — Encounter: Payer: Self-pay | Admitting: Neurology

## 2018-03-14 ENCOUNTER — Ambulatory Visit: Payer: BLUE CROSS/BLUE SHIELD | Admitting: Neurology

## 2018-03-14 VITALS — BP 152/87 | HR 80 | Ht 69.0 in | Wt 243.0 lb

## 2018-03-14 DIAGNOSIS — G40209 Localization-related (focal) (partial) symptomatic epilepsy and epileptic syndromes with complex partial seizures, not intractable, without status epilepticus: Secondary | ICD-10-CM

## 2018-03-14 DIAGNOSIS — Z9889 Other specified postprocedural states: Secondary | ICD-10-CM

## 2018-03-14 DIAGNOSIS — R51 Headache: Secondary | ICD-10-CM

## 2018-03-14 DIAGNOSIS — R519 Headache, unspecified: Secondary | ICD-10-CM

## 2018-03-14 MED ORDER — NORTRIPTYLINE HCL 10 MG PO CAPS: 20.0000 mg | ORAL_CAPSULE | Freq: Every day | ORAL | 11 refills | Status: DC

## 2018-03-14 MED ORDER — SUMATRIPTAN SUCCINATE 25 MG PO TABS: 25.0000 mg | ORAL_TABLET | ORAL | 6 refills | Status: DC | PRN

## 2018-03-14 NOTE — Progress Notes (Signed)
PATIENT: Kyle Chan DOB: 1965-12-04  Chief Complaint  Patient presents with  . Seizures    He is here with his wife, Kyle Chan.  He is doing well on Lamictal 100mg , one tab BID.  No seizure activity. He is having sharp, shooting pains in his head throughout the day.  His sleep is being disrupted. He was given temazepam 7.5mg  at bedtime but it was unhelpful.     HISTORICAL  Kyle Chan is a 53 year old male, accompanied by his wife Kyle Chan, seen in refer by his primary care physician Dr. Antony Contras, initial evaluation was on June 26, 2017.  He had left frontotemporal craniotomy for resection of meningioma on March 02, 2016.  He presented with sudden onset of confusion prior to the surgery,  MRI of the brain with without contrast on March 02, 2016 prior to surgery showed large left convexity meningioma with underlying mass-effect on the left frontal and temporal lobes, adjacent edema, 6.1 x 5.9 x 4.2 cm, with 10 mm rightward midline shift at the level of the foraminal of [place].  Post surgically, he was treated with Keppra 500 mg twice a day for few months, he had no recurrent seizure, Keppra was stopped.  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, he now complains of memory loss, frequent headaches, confusion, difficulty sleeping, he also has language difficulty, this including comprehensive difficulty, and expressive aphasia, he also describes episode of suddenly lapse of memory while watching TV, could not follow up that episode, there was 3 episode of sudden onset of bowel incontinence, with mild confusion,  He now complains of daily moderate headaches, bilateral frontal, oftentimes it would be exacerbated to a more severe 7 out of 10 headache lasting for few minutes, multiple episode during the day, he also complains of intermittent right leg arm numbness, weakness  Positive surgical MRI of the brain with  without contrast in May 2019 showed no residual meningioma, encephalomalacia involving left lateral temporal lobe and left frontal orbiculum,  UPDATE September 11 2017: He is very happy with lamotrigine 100 mg twice a day, no longer has headaches, no clinical seizure noted, And there was no significant side effect noted.  UPDATE Mar 14 2018: He no longer has spells of sudden onset memory loss since lamotrigine 100mg  bid, Reported history of frequent headaches since his head injury concussion few years ago, now complains of increased headache, frequent pressure headaches, also multiple episode of sharp traveling shooting pain, he has been taking almost daily Excedrin Migraine with some help, during intense headaches he does has noise light sensitivity, movement made it worse,  He also complains of difficulty sleeping sometimes.  REVIEW OF SYSTEMS: Full 14 system review of systems performed and notable only for as above  ALLERGIES: Allergies  Allergen Reactions  . Bee Venom Swelling    HOME MEDICATIONS: Current Outpatient Medications  Medication Sig Dispense Refill  . amLODipine (NORVASC) 10 MG tablet Take 10 mg by mouth every morning.     Marland Kitchen aspirin EC 81 MG tablet Take 81 mg by mouth daily.    . baclofen (LIORESAL) 10 MG tablet Take 10 mg by mouth 2 (two) times daily as needed for muscle spasms.     Marland Kitchen lamoTRIgine (LAMICTAL) 100 MG tablet Take 1 tablet (100 mg total) by mouth 2 (two) times daily. 180 tablet 4  . Omega-3 Fatty Acids (OMEGA-3 FISH OIL PO) Take 1 capsule  by mouth daily.    Marland Kitchen telmisartan (MICARDIS) 80 MG tablet Take 80 mg by mouth daily.  1  . vitamin B-12 (CYANOCOBALAMIN) 1000 MCG tablet Take 1,000 mcg by mouth daily.     No current facility-administered medications for this visit.     PAST MEDICAL HISTORY: Past Medical History:  Diagnosis Date  . Back pain   . Benign non-nodular prostatic hyperplasia with lower urinary tract symptoms   . CVA (cerebral vascular  accident) ([place])   . 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 History of cerebral meningioma    S/P  RESECTION 03-03-2016---  CAUSED EXPRESSIVE APHASIA  AND THIS CONTINUES  . History of diverticulitis   . History of gastroesophageal reflux (GERD)   . History of meningioma   . History of penile cancer   . Hypertension   . Lower urinary tract symptoms (LUTS)    rapaflo has helped symptoms  . Memory loss   . Overactive bladder   . Poor historian    due to expressive aphasia/  congitive impairment  . 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 Wears dentures    upper  . Wears glasses     PAST SURGICAL HISTORY: Past Surgical History:  Procedure Laterality Date  . CRANIOTOMY Left 03/03/2016   Procedure: LEFT CRANIOTOMY FOR BRAIN TUMOR;  Surgeon: Eustace Moore, MD;  Location: [facility];  Service: Neurosurgery;  Laterality: Left;  . CRANIOTOMY Left 03/21/2016   Procedure: IRRIGATION AND DEBRIDMENT OF CRANIOTOMY  AND EXPLORATION OF WOUND;  Surgeon: Eustace Moore, MD;  Location: [facility];  Service: Neurosurgery;  Laterality: Left;  IRRIGATION AND DEBRIDMENT OF CRANIOTOMY  AND EXPLORATION OF WOUND  . NASAL RECONSTRUCTION WITH SEPTAL REPAIR N/A 11/18/2012   Procedure: NASAL RECONSTRUCTION WITH SEPTAL REPAIR;  Surgeon: Melida Quitter, MD;  Location: [facility];  Service: ENT;  Laterality: N/A;  . PENILE BIOPSY N/A 07/11/2016   Procedure: EXCISION OF PENILE LESION;  Surgeon: Kathie Rhodes, MD;  Location: [place];  Service: Urology;  Laterality: N/A;    FAMILY HISTORY: Family History  Problem Relation Age of Onset  . Hypertension Mother   . Abdominal Wall Hernia Mother   . Diabetes Mother   . Colon cancer Mother   . Hypertension Father     SOCIAL HISTORY:  Social History   Socioeconomic History  . Marital status: Married    Spouse name: Not on file  . Number of children: 0  . Years of education: 59  .  Highest education level: High school graduate  Occupational History  . Occupation: currently trying to get disablility  Social Needs  . Financial resource strain: Not on file  . Food insecurity:    Worry: Not on file    Inability: Not on file  . Transportation needs:    Medical: Not on file    Non-medical: Not on file  Tobacco Use  . Smoking status: Former Smoker    Years: 27.00    Types: Cigarettes    Last attempt to quit: 07/04/2008    Years since quitting: 9.6  . Smokeless tobacco: Never Used  Substance and Sexual Activity  . Alcohol use: Yes    Comment: social - one drink every 2-3 months  . Drug use: No  . Sexual activity: Not on file  Lifestyle  . Physical activity:    Days per week: Not on file  Minutes per session: Not on file  . Stress: Not on file  Relationships  . Social connections:    Talks on phone: Not on file    Gets together: Not on file    Attends religious service: Not on file    Active member of club or organization: Not on file    Attends meetings of clubs or organizations: Not on file    Relationship status: Not on file  . Intimate partner violence:    Fear of current or ex partner: Not on file    Emotionally abused: Not on file    Physically abused: Not on file    Forced sexual activity: Not on file  Other Topics Concern  . Not on file  Social History Narrative   Lives at home with his wife.   Right-handed.   Caffeine use:  4 cups per day.     PHYSICAL EXAM   Vitals:   03/14/18 0912  BP: (!) 152/87  Pulse: 80  Weight: 243 lb (110.2 kg)  Height: 5\' 9"  (1.753 m)    Not recorded      Body mass index is 35.88 kg/m.  PHYSICAL EXAMNIATION:  Gen: NAD, conversant, well nourised, obese, well groomed                     Cardiovascular: Regular rate rhythm, no peripheral edema, warm, nontender. Eyes: Conjunctivae clear without exudates or hemorrhage Neck: Supple, no carotid bruits. Pulmonary: Clear to auscultation bilaterally    NEUROLOGICAL EXAM:  MENTAL STATUS: MMSE - Mini Mental State Exam 06/26/2017  Orientation to time 5  Orientation to Place 5  Registration 3  Attention/ Calculation 5  Recall 1  Language- name 2 objects 2  Language- repeat 1  Language- follow 3 step command 3  Language- read & follow direction 1  Write a sentence 0  Copy design 1  Total score 27  Animal naming 8,  He follows two-step commands without any difficulty  CRANIAL NERVES: CN II: Visual fields are full to confrontation. Pupils are round equal and briskly reactive to light. CN III, IV, VI: extraocular movement are normal. No ptosis. CN V: Facial sensation is intact to pinprick in all 3 divisions bilaterally. Corneal responses are intact.  CN VII: Face is symmetric with normal eye closure and smile. CN VIII: Hearing is normal to rubbing fingers CN IX, X: Palate elevates symmetrically. Phonation is normal. CN XI: Head turning and shoulder shrug are intact CN XII: Tongue is midline with normal movements and no atrophy.  MOTOR: There is no pronator drift of out-stretched arms. Muscle bulk and tone are normal. Muscle strength is normal.  REFLEXES: Reflexes are 2+ and symmetric at the biceps, triceps, knees, and ankles. Plantar responses are flexor.  SENSORY: Intact to light touch, pinprick, positional sensation and vibratory sensation are intact in fingers and toes.  COORDINATION: Rapid alternating movements and fine finger movements are intact. There is no dysmetria on finger-to-nose and heel-knee-shin.    GAIT/STANCE: Posture is normal. Gait is steady with normal steps, base, arm swing, and turning. Heel and toe walking are normal. Tandem gait is normal.  Romberg is absent.   DIAGNOSTIC DATA (LABS, IMAGING, TESTING) - I reviewed patient records, labs, notes, testing and imaging myself where available.   ASSESSMENT AND PLAN  LUI BELLIS is a 53 y.o. male   Status post large sized left convexity  meningioma resection in February 2018,  Evidence of encephalomalacia involving left temporal lobe,  and left frontal orbicular  He is at a high risk for partial seizure, he does reported episode of relapse of memory, even episode of bowel incontinence, with associated confusion, he also complains of daily moderate to severe headaches  Responded very well to lamotrigine titrating to 100 mg twice a day, no longer has recurrent spells of transient memory lapse,  EEG was normal in June 2019 Frequent headaches  He has been taking frequent over-the-counter Excedrin Migraine  I advise him to take imitrex 25mg  as needed. Limit the use to less than twice each week.  Add on Nortriptyline 20mg  qhs as preventive medications  Marcial Pacas, M.D. Ph.D.  [facility] [address] [place], [place] [address] Ph: [phone] Fax: [phone]  OM:[id number], Shanon Brow, MD

## 2018-03-26 DIAGNOSIS — J329 Chronic sinusitis, unspecified: Secondary | ICD-10-CM | POA: Diagnosis not present

## 2018-05-28 DIAGNOSIS — Z0000 Encounter for general adult medical examination without abnormal findings: Secondary | ICD-10-CM | POA: Diagnosis not present

## 2018-05-28 DIAGNOSIS — J309 Allergic rhinitis, unspecified: Secondary | ICD-10-CM | POA: Diagnosis not present

## 2018-05-28 DIAGNOSIS — N529 Male erectile dysfunction, unspecified: Secondary | ICD-10-CM | POA: Diagnosis not present

## 2018-05-28 DIAGNOSIS — I69320 Aphasia following cerebral infarction: Secondary | ICD-10-CM | POA: Diagnosis not present

## 2018-05-28 DIAGNOSIS — I10 Essential (primary) hypertension: Secondary | ICD-10-CM | POA: Diagnosis not present

## 2018-05-29 ENCOUNTER — Other Ambulatory Visit: Payer: Self-pay

## 2018-05-29 ENCOUNTER — Encounter: Payer: Self-pay | Admitting: Neurology

## 2018-05-29 ENCOUNTER — Ambulatory Visit (INDEPENDENT_AMBULATORY_CARE_PROVIDER_SITE_OTHER): Payer: BLUE CROSS/BLUE SHIELD | Admitting: Neurology

## 2018-05-29 DIAGNOSIS — R51 Headache: Secondary | ICD-10-CM | POA: Diagnosis not present

## 2018-05-29 DIAGNOSIS — G40209 Localization-related (focal) (partial) symptomatic epilepsy and epileptic syndromes with complex partial seizures, not intractable, without status epilepticus: Secondary | ICD-10-CM | POA: Diagnosis not present

## 2018-05-29 DIAGNOSIS — R519 Headache, unspecified: Secondary | ICD-10-CM

## 2018-05-29 NOTE — Progress Notes (Signed)
Virtual Visit via Video Note  I connected with Kyle Chan on 05/29/18 at 11:30 AM EDT by a video enabled telemedicine application and verified that I am speaking with the correct person using two identifiers.  Location: Patient: At his place of residence Provider: At her place of residence    I discussed the limitations of evaluation and management by telemedicine and the availability of in person appointments. The patient expressed understanding and agreed to proceed.  History of Present Illness: Kyle Chan is a 53 year old male, accompanied by his wife Kyle Chan, seen in refer by his primary care physician Dr. Antony Contras, initial evaluation was on June 26, 2017.  He had left frontotemporal craniotomy for resection of meningioma on March 02, 2016.  He presented with sudden onset of confusion prior to the surgery,  MRI of the brain with without contrast on March 02, 2016 prior to surgery showed large left convexity meningioma with underlying mass-effect on the left frontal and temporal lobes, adjacent edema, 6.1 x 5.9 x 4.2 cm, with 10 mm rightward midline shift at the level of the foraminal of [place].  Post surgically, he was treated with Keppra 500 mg twice a day for few months, he had no recurrent seizure, Keppra was stopped.  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, he now complains of memory loss, frequent headaches, confusion, difficulty sleeping, he also has language difficulty, this including comprehensive difficulty, and expressive aphasia, he also describes episode of suddenly lapse of memory while watching TV, could not follow up that episode, there was 3 episode of sudden onset of bowel incontinence, with mild confusion,  He now complains of daily moderate headaches, bilateral frontal, oftentimes it would be exacerbated to a more severe 7 out of 10 headache lasting for few minutes, multiple  episode during the day, he also complains of intermittent right leg arm numbness, weakness  Positive surgical MRI of the brain with without contrast in May 2019 showed no residual meningioma, encephalomalacia involving left lateral temporal lobe and left frontal orbiculum,  UPDATE September 11 2017: He is very happy with lamotrigine 100 mg twice a day, no longer has headaches, no clinical seizure noted, And there was no significant side effect noted.  UPDATE Mar 14 2018: He no longer has spells of sudden onset memory loss since lamotrigine 100mg  bid, Reported history of frequent headaches since his head injury concussion few years ago, now complains of increased headache, frequent pressure headaches, also multiple episode of sharp traveling shooting pain, he has been taking almost daily Excedrin Migraine with some help, during intense headaches he does has noise light sensitivity, movement made it worse,  He also complains of difficulty sleeping sometimes.   Update 05/29/2018 SS:  He is currently taking Lamictal 100 mg twice daily, nortriptyline 20 mg at bedtime, Imitrex as needed. He is high risk for partial seizure given meningioma resection in February 2018, has been having transient memory lapse spells.  Since being on the Lamictal, he has not had those.  He continues to report trouble with memory, difficulty naming the names of streets, he does use a GPS for driving, has difficulty at times with conversation, following a movie or TV show story line. He wife says he is a very careful driver. His headaches have improved, is having about 1 headache a week, taking Imitrex once weekly.  This is beneficial.  No new problems or concerns.  Overall he feels things have improved.  Observations/Objective: Alert, answers questions appropriately, facial symmetry noted, Earl Gala is clear and concise, follows commands, gait is intact, able to stand from seated position independently  Assessment and Plan: 1.   Status post large sized left meningioma resection in February 2018, high risk for partial seizure, history of recurrent spells of transient memory lapse 2.  Frequent headache  He is no longer having the spells of transient memory loss, he has not had any staring off spell.  His headaches have improved in frequency, he is having 1 headache per week.  He will continue taking Lamictal 100 mg twice a day.  I will check lab work to evaluate tolerability.  He will continue taking nortriptyline 20 mg at bedtime.  He will take Imitrex as needed. He does not wish to increase the dose at this time.  We will follow his memory difficulties over time.  Follow Up Instructions: 6 months   I discussed the assessment and treatment plan with the patient. The patient was provided an opportunity to ask questions and all were answered. The patient agreed with the plan and demonstrated an understanding of the instructions.   The patient was advised to call back or seek an in-person evaluation if the symptoms worsen or if the condition fails to improve as anticipated.  I provided 20 minutes of non-face-to-face time during this encounter.   Evangeline Dakin, DNP  [facility] [address], [place], [place] [address] [phone]

## 2018-06-04 ENCOUNTER — Other Ambulatory Visit: Payer: Self-pay

## 2018-06-04 ENCOUNTER — Other Ambulatory Visit (INDEPENDENT_AMBULATORY_CARE_PROVIDER_SITE_OTHER): Payer: Self-pay

## 2018-06-04 DIAGNOSIS — G40209 Localization-related (focal) (partial) symptomatic epilepsy and epileptic syndromes with complex partial seizures, not intractable, without status epilepticus: Secondary | ICD-10-CM

## 2018-06-04 DIAGNOSIS — Z0289 Encounter for other administrative examinations: Secondary | ICD-10-CM

## 2018-06-07 ENCOUNTER — Telehealth: Payer: Self-pay | Admitting: Neurology

## 2018-06-07 LAB — COMPREHENSIVE METABOLIC PANEL
ALT: 34 IU/L (ref 0–44)
AST: 26 IU/L (ref 0–40)
Albumin/Globulin Ratio: 1.5 (ref 1.2–2.2)
Albumin: 4.4 g/dL (ref 3.8–4.9)
Alkaline Phosphatase: 102 IU/L (ref 39–117)
BUN/Creatinine Ratio: 8 — ABNORMAL LOW (ref 9–20)
BUN: 9 mg/dL (ref 6–24)
Bilirubin Total: 0.3 mg/dL (ref 0.0–1.2)
CO2: 25 mmol/L (ref 20–29)
Calcium: 9.7 mg/dL (ref 8.7–10.2)
Chloride: 101 mmol/L (ref 96–106)
Creatinine, Ser: 1.15 mg/dL (ref 0.76–1.27)
GFR calc Af Amer: 83 mL/min/{1.73_m2} (ref >59)
GFR calc non Af Amer: 72 mL/min/{1.73_m2} (ref >59)
Globulin, Total: 3 g/dL (ref 1.5–4.5)
Glucose: 116 mg/dL — ABNORMAL HIGH (ref 65–99)
Potassium: 3.8 mmol/L (ref 3.5–5.2)
Sodium: 140 mmol/L (ref 134–144)
Total Protein: 7.4 g/dL (ref 6.0–8.5)

## 2018-06-07 LAB — LAMOTRIGINE LEVEL: Lamotrigine Lvl: 7.4 ug/mL (ref 2.0–20.0)

## 2018-06-07 LAB — CBC WITH DIFFERENTIAL/PLATELET
Basophils Absolute: 0 10*3/uL (ref 0.0–0.2)
Basos: 0 %
EOS (ABSOLUTE): 0.3 10*3/uL (ref 0.0–0.4)
Eos: 3 %
Hematocrit: 46 % (ref 37.5–51.0)
Hemoglobin: 15.7 g/dL (ref 13.0–17.7)
Immature Grans (Abs): 0.1 10*3/uL (ref 0.0–0.1)
Immature Granulocytes: 1 %
Lymphocytes Absolute: 2.5 10*3/uL (ref 0.7–3.1)
Lymphs: 22 %
MCH: 31.5 pg (ref 26.6–33.0)
MCHC: 34.1 g/dL (ref 31.5–35.7)
MCV: 92 fL (ref 79–97)
Monocytes Absolute: 0.9 10*3/uL (ref 0.1–0.9)
Monocytes: 8 %
Neutrophils Absolute: 7.3 10*3/uL — ABNORMAL HIGH (ref 1.4–7.0)
Neutrophils: 66 %
Platelets: 304 10*3/uL (ref 150–450)
RBC: 4.99 x10E6/uL (ref 4.14–5.80)
RDW: 14.2 % (ref 11.6–15.4)
WBC: 11 10*3/uL — ABNORMAL HIGH (ref 3.4–10.8)

## 2018-06-07 MED ORDER — LAMOTRIGINE 100 MG PO TABS: 100.0000 mg | ORAL_TABLET | Freq: Two times a day (BID) | ORAL | 1 refills | Status: DC

## 2018-06-07 NOTE — Telephone Encounter (Signed)
Spoke to pt.  Relayed that the labs as per below.  He will contact his pcp as needed (he relayed that everytime his wbc checked was elevated even with pcp).  Lamictal level normal, will continue current dose. Refill called in.   He verbalized understanding.

## 2018-06-07 NOTE — Telephone Encounter (Signed)
Please call the patient. Lab work looks good, Lamictal level is in normal range. Continue at current dose. His WBC count was mildly high 11.0, may be developing minor illness, if feeling poorly discuss with pcp. I sent in a refill of Lamictal 100 mg twice daily.

## 2018-06-12 DIAGNOSIS — I10 Essential (primary) hypertension: Secondary | ICD-10-CM | POA: Diagnosis not present

## 2018-06-12 DIAGNOSIS — Z125 Encounter for screening for malignant neoplasm of prostate: Secondary | ICD-10-CM | POA: Diagnosis not present

## 2018-06-19 ENCOUNTER — Ambulatory Visit: Payer: BLUE CROSS/BLUE SHIELD | Admitting: Neurology

## 2018-12-11 ENCOUNTER — Other Ambulatory Visit: Payer: Self-pay | Admitting: *Deleted

## 2018-12-11 MED ORDER — LAMOTRIGINE 100 MG PO TABS: 100.0000 mg | ORAL_TABLET | Freq: Two times a day (BID) | ORAL | 1 refills | Status: DC

## 2018-12-27 ENCOUNTER — Telehealth: Payer: Self-pay | Admitting: Neurology

## 2018-12-27 NOTE — Telephone Encounter (Signed)
I was able to do a peer to peer review with his insurance physician Dr. 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

## 2018-12-27 NOTE — Telephone Encounter (Signed)
David from Dr. Rudene Re office called stating that Dr. Lavone Neri is wanting to do a peer to peer to discuss the restrictions for the pt's disability. Please advise.

## 2018-12-27 NOTE — Telephone Encounter (Signed)
Dr. Bobette Mo - neurologist in [place] who is handling the patient's disability.  His correct office number is [phone].  He is requesting to speak directly to Dr. Krista Blue concerning this patient's medical condition.

## 2019-03-28 ENCOUNTER — Other Ambulatory Visit: Payer: Self-pay | Admitting: Neurology

## 2019-04-12 ENCOUNTER — Telehealth: Payer: Self-pay | Admitting: Neurology

## 2019-04-12 NOTE — Telephone Encounter (Signed)
Kyle Chan,Kyle Chan(wife on DPR) is asking for a call from RN to discuss Disability for pt.

## 2019-04-15 NOTE — Telephone Encounter (Signed)
LMVM for pt/wife to return call.

## 2019-04-15 NOTE — Telephone Encounter (Signed)
Pt wife returned missed call

## 2019-04-15 NOTE — Telephone Encounter (Signed)
Please let patient know, by sign to my chart, have full access to his medical record,  Our office will not generate separate letters about his medical condition

## 2019-04-15 NOTE — Telephone Encounter (Signed)
I spoke to wife.  Pt has SSD, is trying to get LTD thru employer.  They have denied claim (since pt did not want to settle).  They have hired an Forensic psychologist and are wanting to know if Dr. Krista Blue and other physicians on board (requiring a letter and records for them).  Should receive request.

## 2019-04-16 NOTE — Telephone Encounter (Signed)
I called the patient's wife back. She is aware of the information below. She will ask them to send a medical release form to our office.

## 2019-04-25 ENCOUNTER — Other Ambulatory Visit: Payer: Self-pay | Admitting: Neurology

## 2019-06-06 NOTE — Progress Notes (Deleted)
PATIENT: Kyle Chan DOB: 1965/03/24  REASON FOR VISIT: follow up HISTORY FROM: patient  HISTORY OF PRESENT ILLNESS: Today 06/06/19  HISTORY  Kyle Mcniff Andersonis a 54 year old male, accompanied by his wife [name], seen in refer by his primary care physician Dr. Antony Contras, initial evaluation was on June 26, 2017.  He had left frontotemporal craniotomy for resection of meningioma on March 02, 2016. He presented with sudden onset of confusion prior to the surgery,  MRI of the brain with without contrast on March 02, 2016 prior to surgery showed large left convexity meningioma with underlying mass-effect on the left frontal and temporal lobes, adjacent edema, 6.1 x 5.9 x 4.2 cm, with 10 mm rightward midline shift at the level of the foraminal of [place].  Post surgically, he was treated with Keppra 500 mg twice a day for few months, he had no recurrent seizure, Keppra was stopped.  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, he now complains of memory loss, frequent headaches, confusion, difficulty sleeping, he also has language difficulty, this including comprehensive difficulty, and expressive aphasia, he also describes episode of suddenly lapse of memory while watching TV, could not follow up that episode, there was 3 episode of sudden onset of bowel incontinence, with mild confusion,  He now complains of daily moderate headaches, bilateral frontal, oftentimes it would be exacerbated to a more severe 7 out of 10 headache lasting for few minutes, multiple episode during the day, he also complains of intermittent right leg arm numbness, weakness  Positive surgical MRI of the brain with without contrast in May 2019 showed no residual meningioma, encephalomalacia involving left lateral temporal lobe and left frontal orbiculum,  UPDATE September 11 2017: He is very happy with lamotrigine 100 mg twice a day, no longer  has headaches, no clinical seizure noted, And there was no significant side effect noted.  UPDATE Mar 14 2018: He no longer has spells of sudden onset memory loss since lamotrigine 100mg  bid, Reported history of frequent headaches since his head injury concussion few years ago, now complains of increased headache, frequent pressure headaches, also multiple episode of sharp traveling shooting pain, he has been taking almost daily Excedrin Migraine with some help, during intense headaches he does has noise light sensitivity, movement made it worse,  He also complains of difficulty sleeping sometimes.  Update 05/29/2018 SS:  He is currently taking Lamictal 100 mg twice daily, nortriptyline 20 mg at bedtime, Imitrex as needed. He is high risk for partial seizure given meningioma resection in February 2018, has been having transient memory lapse spells.  Since being on the Lamictal, he has not had those.  He continues to report trouble with memory, difficulty naming the names of streets, he does use a GPS for driving, has difficulty at times with conversation, following a movie or TV show story line. He wife says he is a very careful driver. His headaches have improved, is having about 1 headache a week, taking Imitrex once weekly.  This is beneficial.  No new problems or concerns.  Overall he feels things have improved.  Update Jun 10, 2019 SS:   REVIEW OF SYSTEMS: Out of a complete 14 system review of symptoms, the patient complains only of the following symptoms, and all other reviewed systems are negative.  ALLERGIES: Allergies  Allergen Reactions  . Bee Venom Swelling    HOME MEDICATIONS: Outpatient Medications Prior to  Visit  Medication Sig Dispense Refill  . amLODipine (NORVASC) 10 MG tablet Take 10 mg by mouth every morning.     Marland Kitchen aspirin EC 81 MG tablet Take 81 mg by mouth daily.    . baclofen (LIORESAL) 10 MG tablet Take 10 mg by mouth 2 (two) times daily as needed for muscle  spasms.     Marland Kitchen lamoTRIgine (LAMICTAL) 100 MG tablet Take 1 tablet (100 mg total) by mouth 2 (two) times daily. 180 tablet 1  . nortriptyline (PAMELOR) 10 MG capsule TAKE 2 CAPSULES BY MOUTH AT BEDTIME 60 capsule 1  . Omega-3 Fatty Acids (OMEGA-3 FISH OIL PO) Take 1 capsule by mouth daily.    . SUMAtriptan (IMITREX) 25 MG tablet TAKE ONE TABLET BY MOUTH EVERY 2 HOURS AS NEEDED FOR MIGRAINE. MAY REPEAT IN 2 HOURS IF HEADACHE PERSISITS OR RECURS. 9 tablet 0  . telmisartan (MICARDIS) 80 MG tablet Take 80 mg by mouth daily.  1  . vitamin B-12 (CYANOCOBALAMIN) 1000 MCG tablet Take 1,000 mcg by mouth daily.     No facility-administered medications prior to visit.    PAST MEDICAL HISTORY: Past Medical History:  Diagnosis Date  . Back pain   . Benign non-nodular prostatic hyperplasia with lower urinary tract symptoms   . CVA (cerebral vascular accident) ([place])   . 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 History of cerebral meningioma    S/P  RESECTION 03-03-2016---  CAUSED EXPRESSIVE APHASIA  AND THIS CONTINUES  . History of diverticulitis   . History of gastroesophageal reflux (GERD)   . History of meningioma   . History of penile cancer   . Hypertension   . Lower urinary tract symptoms (LUTS)    rapaflo has helped symptoms  . Memory loss   . Overactive bladder   . Poor historian    due to expressive aphasia/  congitive impairment  . 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 Wears dentures    upper  . Wears glasses     PAST SURGICAL HISTORY: Past Surgical History:  Procedure Laterality Date  . CRANIOTOMY Left 03/03/2016   Procedure: LEFT CRANIOTOMY FOR BRAIN TUMOR;  Surgeon: Eustace Moore, MD;  Location: [facility];  Service: Neurosurgery;  Laterality: Left;  . CRANIOTOMY Left 03/21/2016   Procedure: IRRIGATION AND DEBRIDMENT OF CRANIOTOMY  AND EXPLORATION OF WOUND;  Surgeon: Eustace Moore, MD;  Location: [facility];   Service: Neurosurgery;  Laterality: Left;  IRRIGATION AND DEBRIDMENT OF CRANIOTOMY  AND EXPLORATION OF WOUND  . NASAL RECONSTRUCTION WITH SEPTAL REPAIR N/A 11/18/2012   Procedure: NASAL RECONSTRUCTION WITH SEPTAL REPAIR;  Surgeon: Melida Quitter, MD;  Location: [facility];  Service: ENT;  Laterality: N/A;  . PENILE BIOPSY N/A 07/11/2016   Procedure: EXCISION OF PENILE LESION;  Surgeon: Kathie Rhodes, MD;  Location: [facility];  Service: Urology;  Laterality: N/A;    FAMILY HISTORY: Family History  Problem Relation Age of Onset  . Hypertension Mother   . Abdominal Wall Hernia Mother   . Diabetes Mother   . Colon cancer Mother   . Hypertension Father     SOCIAL HISTORY: Social History   Socioeconomic History  . Marital status: Married    Spouse name: Not on file  . Number of children: 0  . Years of education: 72  . Highest education level: High school graduate  Occupational History  . Occupation:  currently trying to get disablility  Tobacco Use  . Smoking status: Former Smoker    Years: 27.00    Types: Cigarettes    Quit date: 07/04/2008    Years since quitting: 10.9  . Smokeless tobacco: Never Used  Substance and Sexual Activity  . Alcohol use: Yes    Comment: social - one drink every 2-3 months  . Drug use: No  . Sexual activity: Not on file  Other Topics Concern  . Not on file  Social History Narrative   Lives at home with his wife.   Right-handed.   Caffeine use:  4 cups per day.   Social Determinants of Health   Financial Resource Strain:   . Difficulty of Paying Living Expenses:   Food Insecurity:   . Worried About Charity fundraiser in the Last Year:   . Arboriculturist in the Last Year:   Transportation Needs:   . Film/video editor (Medical):   Marland Kitchen Lack of Transportation (Non-Medical):   Physical Activity:   . Days of Exercise per Week:   . Minutes of Exercise per Session:   Stress:   . Feeling of Stress :   Social Connections:   .  Frequency of Communication with Friends and Family:   . Frequency of Social Gatherings with Friends and Family:   . Attends Religious Services:   . Active Member of Clubs or Organizations:   . Attends Archivist Meetings:   Marland Kitchen Marital Status:   Intimate Partner Violence:   . Fear of Current or Ex-Partner:   . Emotionally Abused:   Marland Kitchen Physically Abused:   . Sexually Abused:       PHYSICAL EXAM  There were no vitals filed for this visit. There is no height or weight on file to calculate BMI.  Generalized: Well developed, in no acute distress   Neurological examination  Mentation: Alert oriented to time, place, history taking. Follows all commands speech and language fluent Cranial nerve II-XII: Pupils were equal round reactive to light. Extraocular movements were full, visual field were full on confrontational test. Facial sensation and strength were normal. Uvula tongue midline. Head turning and shoulder shrug  were normal and symmetric. Motor: The motor testing reveals 5 over 5 strength of all 4 extremities. Good symmetric motor tone is noted throughout.  Sensory: Sensory testing is intact to soft touch on all 4 extremities. No evidence of extinction is noted.  Coordination: Cerebellar testing reveals good finger-nose-finger and heel-to-shin bilaterally.  Gait and station: Gait is normal. Tandem gait is normal. Romberg is negative. No drift is seen.  Reflexes: Deep tendon reflexes are symmetric and normal bilaterally.   DIAGNOSTIC DATA (LABS, IMAGING, TESTING) - I reviewed patient records, labs, notes, testing and imaging myself where available.  Lab Results  Component Value Date   WBC 11.0 (H) 06/04/2018   HGB 15.7 06/04/2018   HCT 46.0 06/04/2018   MCV 92 06/04/2018   PLT 304 06/04/2018      Component Value Date/Time   NA 140 06/04/2018 1118   K 3.8 06/04/2018 1118   CL 101 06/04/2018 1118   CO2 25 06/04/2018 1118   GLUCOSE 116 (H) 06/04/2018 1118   GLUCOSE  119 (H) 07/11/2016 0649   BUN 9 06/04/2018 1118   CREATININE 1.15 06/04/2018 1118   CALCIUM 9.7 06/04/2018 1118   PROT 7.4 06/04/2018 1118   ALBUMIN 4.4 06/04/2018 1118   AST 26 06/04/2018 1118   ALT 34 06/04/2018  [id number] 06/04/2018 1118   BILITOT 0.3 06/04/2018 1118   GFRNONAA 72 06/04/2018 1118   GFRAA 83 06/04/2018 1118   No results found for: CHOL, HDL, LDLCALC, LDLDIRECT, TRIG, CHOLHDL No results found for: HGBA1C No results found for: VITAMINB12 No results found for: TSH    ASSESSMENT AND PLAN 54 y.o. 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 here with:  1.  Status post large sized left meningioma resection in February 2018, high risk for partial seizure, history of recurrent spells of transient memory lapse 2.  Frequent headache   I spent 15 minutes with the patient. 50% of this time was spent   Butler Denmark, [place], DNP 06/06/2019, 4:01 PM [facility] [address] [place], [place] [address] [phone]

## 2019-06-10 ENCOUNTER — Ambulatory Visit: Payer: BLUE CROSS/BLUE SHIELD | Admitting: Neurology

## 2019-06-10 ENCOUNTER — Encounter: Payer: Self-pay | Admitting: Neurology

## 2019-06-14 ENCOUNTER — Other Ambulatory Visit: Payer: Self-pay | Admitting: Neurology

## 2019-06-18 ENCOUNTER — Other Ambulatory Visit: Payer: Self-pay | Admitting: *Deleted

## 2019-06-18 MED ORDER — SUMATRIPTAN SUCCINATE 25 MG PO TABS: ORAL_TABLET | ORAL | 0 refills | Status: DC

## 2019-07-08 ENCOUNTER — Other Ambulatory Visit: Payer: Self-pay | Admitting: Neurology

## 2019-07-22 ENCOUNTER — Encounter: Payer: Self-pay | Admitting: Neurology

## 2019-07-22 ENCOUNTER — Ambulatory Visit: Payer: Medicare (Managed Care) | Admitting: Neurology

## 2019-07-22 VITALS — BP 161/96 | HR 68 | Ht 67.0 in | Wt 235.0 lb

## 2019-07-22 DIAGNOSIS — R519 Headache, unspecified: Secondary | ICD-10-CM | POA: Diagnosis not present

## 2019-07-22 DIAGNOSIS — G40209 Localization-related (focal) (partial) symptomatic epilepsy and epileptic syndromes with complex partial seizures, not intractable, without status epilepticus: Secondary | ICD-10-CM

## 2019-07-22 MED ORDER — NORTRIPTYLINE HCL 10 MG PO CAPS: 20.0000 mg | ORAL_CAPSULE | Freq: Every day | ORAL | 3 refills | Status: DC

## 2019-07-22 MED ORDER — LAMOTRIGINE 100 MG PO TABS: 100.0000 mg | ORAL_TABLET | Freq: Two times a day (BID) | ORAL | 3 refills | Status: DC

## 2019-07-22 MED ORDER — SUMATRIPTAN SUCCINATE 25 MG PO TABS: ORAL_TABLET | ORAL | 3 refills | Status: DC

## 2019-07-22 NOTE — Progress Notes (Signed)
PATIENT: Kyle Chan DOB: 10-17-1965  REASON FOR VISIT: follow up HISTORY FROM: patient  HISTORY OF PRESENT ILLNESS: Today 07/22/19  HISTORY Kyle Boakye Andersonis a 54 year old male, accompanied by his wife [name], seen in refer by his primary care physician Dr. Antony Contras, initial evaluation was on June 26, 2017.  He had left frontotemporal craniotomy for resection of meningioma on March 02, 2016. He presented with sudden onset of confusion prior to the surgery,  MRI of the brain with without contrast on March 02, 2016 prior to surgery showed large left convexity meningioma with underlying mass-effect on the left frontal and temporal lobes, adjacent edema, 6.1 x 5.9 x 4.2 cm, with 10 mm rightward midline shift at the level of the foraminal of [place].  Post surgically, he was treated with Keppra 500 mg twice a day for few months, he had no recurrent seizure, Keppra was stopped.  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, he now complains of memory loss, frequent headaches, confusion, difficulty sleeping, he also has language difficulty, this including comprehensive difficulty, and expressive aphasia, he also describes episode of suddenly lapse of memory while watching TV, could not follow up that episode, there was 3 episode of sudden onset of bowel incontinence, with mild confusion,  He now complains of daily moderate headaches, bilateral frontal, oftentimes it would be exacerbated to a more severe 7 out of 10 headache lasting for few minutes, multiple episode during the day, he also complains of intermittent right leg arm numbness, weakness  Positive surgical MRI of the brain with without contrast in May 2019 showed no residual meningioma, encephalomalacia involving left lateral temporal lobe and left frontal orbiculum,  UPDATE September 11 2017: He is very happy with lamotrigine 100 mg twice a day, no longer  has headaches, no clinical seizure noted, And there was no significant side effect noted.  UPDATE Mar 14 2018: He no longer has spells of sudden onset memory loss since lamotrigine 100mg  bid, Reported history of frequent headaches since his head injury concussion few years ago, now complains of increased headache, frequent pressure headaches, also multiple episode of sharp traveling shooting pain, he has been taking almost daily Excedrin Migraine with some help, during intense headaches he does has noise light sensitivity, movement made it worse,  He also complains of difficulty sleeping sometimes.  Update 05/29/2018 SS:  He is currently taking Lamictal 100 mg twice daily, nortriptyline 20 mg at bedtime, Imitrex as needed. He is high risk for partial seizure given meningioma resection in February 2018, has been having transient memory lapse spells.  Since being on the Lamictal, he has not had those.  He continues to report trouble with memory, difficulty naming the names of streets, he does use a GPS for driving, has difficulty at times with conversation, following a movie or TV show story line. He wife says he is a very careful driver. His headaches have improved, is having about 1 headache a week, taking Imitrex once weekly.  This is beneficial.  No new problems or concerns.  Overall he feels things have improved.   Update July 22, 2019 SS: Here today for follow-up unaccompanied, remains on Lamictal 100 mg twice a day, nortriptyline 20 mg at bedtime, Imitrex as needed.  Twice a month, may have significant headache, will take Imitrex with excellent benefit.  Does note, does not take nortriptyline consistently nightly.  Seems to be tolerating  medications.  Is now approved for disability, continues to have difficulty with memory secondary to meningioma resection.  He drives, lives with his wife.  Manual BP 150/90. No new neuro symptoms.  No episode of seizure, previously reported as episode of lapse of  memory and confusion.  REVIEW OF SYSTEMS: Out of a complete 14 system review of symptoms, the patient complains only of the following symptoms, and all other reviewed systems are negative.  Seizure  ALLERGIES: Allergies  Allergen Reactions  . Bee Venom Swelling    HOME MEDICATIONS: Outpatient Medications Prior to Visit  Medication Sig Dispense Refill  . amLODipine (NORVASC) 10 MG tablet Take 10 mg by mouth every morning.     Marland Kitchen aspirin EC 81 MG tablet Take 81 mg by mouth daily.    . Omega-3 Fatty Acids (OMEGA-3 FISH OIL PO) Take 1 capsule by mouth daily.    Marland Kitchen telmisartan (MICARDIS) 80 MG tablet Take 80 mg by mouth daily.  1  . lamoTRIgine (LAMICTAL) 100 MG tablet Take 1 tablet by mouth twice daily 180 tablet 1  . nortriptyline (PAMELOR) 10 MG capsule TAKE 2 CAPSULES BY MOUTH AT BEDTIME 60 capsule 1  . SUMAtriptan (IMITREX) 25 MG tablet Take 1 tab at onset of migraine.  May repeat in 2 hrs, if needed.  Max dose: 2 tabs/day. This is a 30 day prescription. 9 tablet 0  . baclofen (LIORESAL) 10 MG tablet Take 10 mg by mouth 2 (two) times daily as needed for muscle spasms.     . vitamin B-12 (CYANOCOBALAMIN) 1000 MCG tablet Take 1,000 mcg by mouth daily.     No facility-administered medications prior to visit.    PAST MEDICAL HISTORY: Past Medical History:  Diagnosis Date  . Back pain   . Benign non-nodular prostatic hyperplasia with lower urinary tract symptoms   . CVA (cerebral vascular accident) ([facility])   . 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 History of cerebral meningioma    S/P  RESECTION 03-03-2016---  CAUSED EXPRESSIVE APHASIA  AND THIS CONTINUES  . History of diverticulitis   . History of gastroesophageal reflux (GERD)   . History of meningioma   . History of penile cancer   . Hypertension   . Lower urinary tract symptoms (LUTS)    rapaflo has helped symptoms  . Memory loss   . Overactive bladder   . Poor  historian    due to expressive aphasia/  congitive impairment  . 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 Wears dentures    upper  . Wears glasses     PAST SURGICAL HISTORY: Past Surgical History:  Procedure Laterality Date  . CRANIOTOMY Left 03/03/2016   Procedure: LEFT CRANIOTOMY FOR BRAIN TUMOR;  Surgeon: Eustace Moore, MD;  Location: [facility];  Service: Neurosurgery;  Laterality: Left;  . CRANIOTOMY Left 03/21/2016   Procedure: IRRIGATION AND DEBRIDMENT OF CRANIOTOMY  AND EXPLORATION OF WOUND;  Surgeon: Eustace Moore, MD;  Location: [facility];  Service: Neurosurgery;  Laterality: Left;  IRRIGATION AND DEBRIDMENT OF CRANIOTOMY  AND EXPLORATION OF WOUND  . NASAL RECONSTRUCTION WITH SEPTAL REPAIR N/A 11/18/2012   Procedure: NASAL RECONSTRUCTION WITH SEPTAL REPAIR;  Surgeon: Melida Quitter, MD;  Location: [facility];  Service: ENT;  Laterality: N/A;  . PENILE BIOPSY N/A 07/11/2016   Procedure: EXCISION OF PENILE LESION;  Surgeon: Kathie Rhodes, MD;  Location: [facility];  Service: Urology;  Laterality: N/A;    FAMILY HISTORY: Family History  Problem Relation Age of Onset  . Hypertension Mother   . Abdominal Wall Hernia Mother   . Diabetes Mother   . Colon cancer Mother   . Hypertension Father     SOCIAL HISTORY: Social History   Socioeconomic History  . Marital status: Married    Spouse name: Not on file  . Number of children: 0  . Years of education: 44  . Highest education level: High school graduate  Occupational History  . Occupation: currently trying to get disablility  Tobacco Use  . Smoking status: Former Smoker    Years: 27.00    Types: Cigarettes    Quit date: 07/04/2008    Years since quitting: 11.0  . Smokeless tobacco: Never Used  Vaping Use  . Vaping Use: Never used  Substance and Sexual Activity  . Alcohol use: Yes    Comment: social - one drink every 2-3 months  . Drug use: No  . Sexual activity: Not on file  Other Topics Concern   . Not on file  Social History Narrative   Lives at home with his wife.   Right-handed.   Caffeine use:  4 cups per day.   Social Determinants of Health   Financial Resource Strain:   . Difficulty of Paying Living Expenses:   Food Insecurity:   . Worried About Charity fundraiser in the Last Year:   . Arboriculturist in the Last Year:   Transportation Needs:   . Film/video editor (Medical):   Marland Kitchen Lack of Transportation (Non-Medical):   Physical Activity:   . Days of Exercise per Week:   . Minutes of Exercise per Session:   Stress:   . Feeling of Stress :   Social Connections:   . Frequency of Communication with Friends and Family:   . Frequency of Social Gatherings with Friends and Family:   . Attends Religious Services:   . Active Member of Clubs or Organizations:   . Attends Archivist Meetings:   Marland Kitchen Marital Status:   Intimate Partner Violence:   . Fear of Current or Ex-Partner:   . Emotionally Abused:   Marland Kitchen Physically Abused:   . Sexually Abused:    PHYSICAL EXAM  Vitals:   07/22/19 0814 07/22/19 0818  BP: (!) 170/93 (!) 161/96  Pulse: 71 68  Weight: 235 lb (106.6 kg)   Height: 5\' 7"  (1.702 m)    Body mass index is 36.81 kg/m.  Generalized: Well developed, in no acute distress   Neurological examination  Mentation: Alert oriented to time, place, history taking. Follows all commands speech and language fluent, mild word finding difficulty Cranial nerve II-XII: Pupils were equal round reactive to light. Extraocular movements were full, visual field were full on confrontational test. Facial sensation and strength were normal. Head turning and shoulder shrug were normal and symmetric. Motor: The motor testing reveals 5 over 5 strength of all 4 extremities. Good symmetric motor tone is noted throughout.  Sensory: Sensory testing is intact to soft touch on all 4 extremities. No evidence of extinction is noted.  Coordination: Cerebellar testing reveals good  finger-nose-finger and heel-to-shin bilaterally.  Gait and station: Gait is normal. Tandem gait is normal. Romberg is negative. No drift is seen.  Reflexes: Deep tendon reflexes are symmetric and normal bilaterally.   DIAGNOSTIC DATA (LABS, IMAGING, TESTING) - I reviewed patient records, labs, notes, testing and imaging myself  where available.  Lab Results  Component Value Date   WBC 11.0 (H) 06/04/2018   HGB 15.7 06/04/2018   HCT 46.0 06/04/2018   MCV 92 06/04/2018   PLT 304 06/04/2018      Component Value Date/Time   NA 140 06/04/2018 1118   K 3.8 06/04/2018 1118   CL 101 06/04/2018 1118   CO2 25 06/04/2018 1118   GLUCOSE 116 (H) 06/04/2018 1118   GLUCOSE 119 (H) 07/11/2016 0649   BUN 9 06/04/2018 1118   CREATININE 1.15 06/04/2018 1118   CALCIUM 9.7 06/04/2018 1118   PROT 7.4 06/04/2018 1118   ALBUMIN 4.4 06/04/2018 1118   AST 26 06/04/2018 1118   ALT 34 06/04/2018 1118   ALKPHOS 102 06/04/2018 1118   BILITOT 0.3 06/04/2018 1118   GFRNONAA 72 06/04/2018 1118   GFRAA 83 06/04/2018 1118   No results found for: CHOL, HDL, LDLCALC, LDLDIRECT, TRIG, CHOLHDL No results found for: HGBA1C No results found for: VITAMINB12 No results found for: TSH    ASSESSMENT AND PLAN 54 y.o. 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 here with:  1.  Status post large sided left meningioma resection in February 2018, high risk for partial seizure, history of recurrent spells of transient memory lapse -Evidence  of encephalomalacia involving left temporal lobe and left frontal orbicular -High risk for partial seizure (previously, episode of memory lapse, even bowel incontinence, with associated confusion) -Denies any recent episode of seizure -Has responded well to lamotrigine, continue lamotrigine 100 mg twice a day -EEG was normal in June 2019 -Check routine blood work today, including Lamictal level  2.  Frequent headache -Overall, doing well, report of 2 significant headaches a month, good benefit with Imitrex -Continue nortriptyline 20 mg at bedtime (take nightly for best result of headache prevention) -Continue Imitrex 25 mg as needed for acute headache -No longer taking frequent over-the-counter medication -Follow-up in 6 months or sooner if needed  I spent 30 minutes of face-to-face and non-face-to-face time with patient.  This included previsit chart review, lab review, study review, order entry, electronic health record documentation, patient education.  Butler Denmark, AGNP-C, DNP 07/22/2019, 9:05 AM [facility] [address], [place], [place] [address] [phone]

## 2019-07-22 NOTE — Patient Instructions (Signed)
Continue current medications  Check blood work today  See you back in 6 months   

## 2019-07-23 LAB — LAMOTRIGINE LEVEL: Lamotrigine Lvl: 4 ug/mL (ref 2.0–20.0)

## 2019-07-23 LAB — CBC WITH DIFFERENTIAL/PLATELET
Basophils Absolute: 0 10*3/uL (ref 0.0–0.2)
Basos: 0 %
EOS (ABSOLUTE): 0.3 10*3/uL (ref 0.0–0.4)
Eos: 3 %
Hematocrit: 46.8 % (ref 37.5–51.0)
Hemoglobin: 16.2 g/dL (ref 13.0–17.7)
Immature Grans (Abs): 0 10*3/uL (ref 0.0–0.1)
Immature Granulocytes: 0 %
Lymphocytes Absolute: 1.8 10*3/uL (ref 0.7–3.1)
Lymphs: 19 %
MCH: 31.8 pg (ref 26.6–33.0)
MCHC: 34.6 g/dL (ref 31.5–35.7)
MCV: 92 fL (ref 79–97)
Monocytes Absolute: 0.8 10*3/uL (ref 0.1–0.9)
Monocytes: 8 %
Neutrophils Absolute: 6.5 10*3/uL (ref 1.4–7.0)
Neutrophils: 70 %
Platelets: 242 10*3/uL (ref 150–450)
RBC: 5.09 x10E6/uL (ref 4.14–5.80)
RDW: 13.9 % (ref 11.6–15.4)
WBC: 9.4 10*3/uL (ref 3.4–10.8)

## 2019-07-23 LAB — COMPREHENSIVE METABOLIC PANEL
ALT: 31 IU/L (ref 0–44)
AST: 23 IU/L (ref 0–40)
Albumin/Globulin Ratio: 1.6 (ref 1.2–2.2)
Albumin: 4.7 g/dL (ref 3.8–4.9)
Alkaline Phosphatase: 100 IU/L (ref 48–121)
BUN/Creatinine Ratio: 12 (ref 9–20)
BUN: 12 mg/dL (ref 6–24)
Bilirubin Total: 0.5 mg/dL (ref 0.0–1.2)
CO2: 24 mmol/L (ref 20–29)
Calcium: 9.6 mg/dL (ref 8.7–10.2)
Chloride: 104 mmol/L (ref 96–106)
Creatinine, Ser: 1.01 mg/dL (ref 0.76–1.27)
GFR calc Af Amer: 96 mL/min/{1.73_m2} (ref >59)
GFR calc non Af Amer: 83 mL/min/{1.73_m2} (ref >59)
Globulin, Total: 2.9 g/dL (ref 1.5–4.5)
Glucose: 120 mg/dL — ABNORMAL HIGH (ref 65–99)
Potassium: 4.3 mmol/L (ref 3.5–5.2)
Sodium: 141 mmol/L (ref 134–144)
Total Protein: 7.6 g/dL (ref 6.0–8.5)

## 2019-07-25 ENCOUNTER — Telehealth: Payer: Self-pay

## 2019-07-25 NOTE — Telephone Encounter (Addendum)
Attempted to call the patient without success.  LM on the VM for the patient to call back re: recent results.  **If the patient calls back please relay the message below from Butler Denmark NP  ----- Message from Suzzanne Cloud, NP sent at 07/23/2019  3:52 PM EDT ----- Labs show no significant abnormality.

## 2019-07-30 NOTE — Telephone Encounter (Signed)
Pt has called back in response to voicemail left, the message Labs show no significant abnormality.  was relayed to him.  Pt had no questions. FYI no call back requested.

## 2019-09-10 NOTE — Progress Notes (Signed)
I have reviewed and agreed above plan. 

## 2019-11-01 ENCOUNTER — Other Ambulatory Visit: Payer: Self-pay | Admitting: Physician Assistant

## 2019-11-01 ENCOUNTER — Ambulatory Visit
Admission: RE | Admit: 2019-11-01 | Discharge: 2019-11-01 | Disposition: A | Discharge status: Home / Self Care | Payer: Medicare (Managed Care) | Source: Ambulatory Visit | Attending: Physician Assistant | Admitting: Physician Assistant

## 2019-11-01 ENCOUNTER — Other Ambulatory Visit: Payer: Self-pay

## 2019-11-01 DIAGNOSIS — M25562 Pain in left knee: Secondary | ICD-10-CM

## 2020-01-02 ENCOUNTER — Ambulatory Visit: Payer: Medicare (Managed Care) | Admitting: Neurology

## 2020-01-02 ENCOUNTER — Encounter: Payer: Self-pay | Admitting: Neurology

## 2020-01-02 VITALS — BP 157/88 | HR 84 | Ht 67.0 in | Wt 232.0 lb

## 2020-01-02 DIAGNOSIS — Z9889 Other specified postprocedural states: Secondary | ICD-10-CM

## 2020-01-02 DIAGNOSIS — R519 Headache, unspecified: Secondary | ICD-10-CM

## 2020-01-02 DIAGNOSIS — G40209 Localization-related (focal) (partial) symptomatic epilepsy and epileptic syndromes with complex partial seizures, not intractable, without status epilepticus: Secondary | ICD-10-CM | POA: Diagnosis not present

## 2020-01-02 MED ORDER — LAMOTRIGINE 100 MG PO TABS
100.0000 mg | ORAL_TABLET | Freq: Two times a day (BID) | ORAL | 3 refills | Status: DC
Start: 2020-01-02 — End: 2021-01-20

## 2020-01-02 MED ORDER — SUMATRIPTAN SUCCINATE 50 MG PO TABS
ORAL_TABLET | ORAL | 11 refills | Status: DC
Start: 2020-01-02 — End: 2021-01-11

## 2020-01-02 NOTE — Progress Notes (Signed)
HISTORY OF PRESENT ILLNESS: Kyle Chan a 54 year old male, accompanied by his wife Kyle Chan, seen in refer by his primary care physician Dr. Antony Contras, initial evaluation was on June 26, 2017.  He had left frontotemporal craniotomy for resection of meningioma on March 02, 2016. He presented with sudden onset of confusion prior to the surgery,  MRI of the brain with without contrast on March 02, 2016 prior to surgery showed large left convexity meningioma with underlying mass-effect on the left frontal and temporal lobes, adjacent edema, 6.1 x 5.9 x 4.2 cm, with 10 mm rightward midline shift at the level of the foraminal of [place].  Post surgically, he was treated with Keppra 500 mg twice a day for few months, he had no recurrent seizure, Keppra was stopped.  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, he now complains of memory loss, frequent headaches, confusion, difficulty sleeping, he also has language difficulty, this including comprehensive difficulty, and expressive aphasia, he also describes episode of suddenly lapse of memory while watching TV, could not follow up that episode, there was 3 episode of sudden onset of bowel incontinence, with mild confusion,  He now complains of daily moderate headaches, bilateral frontal, oftentimes it would be exacerbated to a more severe 7 out of 10 headache lasting for few minutes, multiple episode during the day, he also complains of intermittent right leg arm numbness, weakness  Positive surgical MRI of the brain with without contrast in May 2019 showed no residual meningioma, encephalomalacia involving left lateral temporal lobe and left frontal orbiculum,  UPDATE September 11 2017: He is very happy with lamotrigine 100 mg twice a day, no longer has headaches, no clinical seizure noted, And there was no significant side effect noted.  UPDATE Mar 14 2018: He no  longer has spells of sudden onset memory loss since lamotrigine 100mg  bid, Reported history of frequent headaches since his head injury concussion few years ago, now complains of increased headache, frequent pressure headaches, also multiple episode of sharp traveling shooting pain, he has been taking almost daily Excedrin Migraine with some help, during intense headaches he does has noise light sensitivity, movement made it worse,  He also complains of difficulty sleeping sometimes.  UPDATE Jan 02 2020: He is overall doing very well, tolerating lamotrigine 100 mg twice a day, no longer have seizure, his migraine is under excellent control, Imitrex 25 mg as needed works well most of the time, but still take a couple hours for his headache to go away  Laboratory evaluation in June 2021: Lamotrigine level 4.0, normal CMP, glucose of 120, CBC, hemoglobin 15.7  REVIEW OF SYSTEMS: Out of a complete 14 system review of symptoms, the patient complains only of the following symptoms, and all other reviewed systems are negative.  Seizure  ALLERGIES: Allergies  Allergen Reactions  . Bee Venom Swelling    HOME MEDICATIONS: Outpatient Medications Prior to Visit  Medication Sig Dispense Refill  . amLODipine (NORVASC) 10 MG tablet Take 10 mg by mouth every morning.     Marland Kitchen aspirin EC 81 MG tablet Take 81 mg by mouth daily.    Marland Kitchen lamoTRIgine (LAMICTAL) 100 MG tablet Take 1 tablet (100 mg total) by mouth 2 (two) times daily. 180 tablet 3  . nortriptyline (PAMELOR) 10 MG capsule Take 2 capsules (20 mg total) by mouth at bedtime. 180 capsule 3  . Omega-3 Fatty Acids (OMEGA-3 FISH  OIL PO) Take 1 capsule by mouth daily.    . SUMAtriptan (IMITREX) 25 MG tablet Take 1 tab at onset of migraine.  May repeat in 2 hrs, if needed.  Max dose: 2 tabs/day. This is a 30 day prescription. 9 tablet 3  . telmisartan (MICARDIS) 80 MG tablet Take 80 mg by mouth daily.  1   No facility-administered medications prior to  visit.    PAST MEDICAL HISTORY: Past Medical History:  Diagnosis Date  . Back pain   . Benign non-nodular prostatic hyperplasia with lower urinary tract symptoms   . CVA (cerebral vascular accident) ([facility])   . 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 History of cerebral meningioma    S/P  RESECTION 03-03-2016---  CAUSED EXPRESSIVE APHASIA  AND THIS CONTINUES  . History of diverticulitis   . History of gastroesophageal reflux (GERD)   . History of meningioma   . History of penile cancer   . Hypertension   . Lower urinary tract symptoms (LUTS)    rapaflo has helped symptoms  . Memory loss   . Overactive bladder   . Poor historian    due to expressive aphasia/  congitive impairment  . 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 Wears dentures    upper  . Wears glasses     PAST SURGICAL HISTORY: Past Surgical History:  Procedure Laterality Date  . CRANIOTOMY Left 03/03/2016   Procedure: LEFT CRANIOTOMY FOR BRAIN TUMOR;  Surgeon: Eustace Moore, MD;  Location: [facility];  Service: Neurosurgery;  Laterality: Left;  . CRANIOTOMY Left 03/21/2016   Procedure: IRRIGATION AND DEBRIDMENT OF CRANIOTOMY  AND EXPLORATION OF WOUND;  Surgeon: Eustace Moore, MD;  Location: [facility];  Service: Neurosurgery;  Laterality: Left;  IRRIGATION AND DEBRIDMENT OF CRANIOTOMY  AND EXPLORATION OF WOUND  . NASAL RECONSTRUCTION WITH SEPTAL REPAIR N/A 11/18/2012   Procedure: NASAL RECONSTRUCTION WITH SEPTAL REPAIR;  Surgeon: Melida Quitter, MD;  Location: [facility];  Service: ENT;  Laterality: N/A;  . PENILE BIOPSY N/A 07/11/2016   Procedure: EXCISION OF PENILE LESION;  Surgeon: Kathie Rhodes, MD;  Location: [facility];  Service: Urology;  Laterality: N/A;    FAMILY HISTORY: Family History  Problem Relation Age of Onset  . Hypertension Mother   . Abdominal Wall Hernia Mother   . Diabetes Mother   . Colon cancer Mother   .  Hypertension Father     SOCIAL HISTORY: Social History   Socioeconomic History  . Marital status: Married    Spouse name: Not on file  . Number of children: 0  . Years of education: 72  . Highest education level: High school graduate  Occupational History  . Occupation: currently trying to get disablility  Tobacco Use  . Smoking status: Former Smoker    Years: 27.00    Types: Cigarettes    Quit date: 07/04/2008    Years since quitting: 11.5  . Smokeless tobacco: Never Used  Vaping Use  . Vaping Use: Never used  Substance and Sexual Activity  . Alcohol use: Yes    Comment: social - one drink every 2-3 months  . Drug use: No  . Sexual activity: Not on file  Other Topics Concern  . Not on file  Social History Narrative   Lives at home with his wife.   Right-handed.   Caffeine use:  4 cups per day.  Social Determinants of Health   Financial Resource Strain: Not on file  Food Insecurity: Not on file  Transportation Needs: Not on file  Physical Activity: Not on file  Stress: Not on file  Social Connections: Not on file  Intimate Partner Violence: Not on file   PHYSICAL EXAM  Vitals:   01/02/20 0733  BP: (!) 157/88  Pulse: 84  Weight: 232 lb (105.2 kg)  Height: 5\' 7"  (1.702 m)   Body mass index is 36.34 kg/m.   PHYSICAL EXAMNIATION:  Gen: NAD, conversant, well nourised, well groomed                     Cardiovascular: Regular rate rhythm, no peripheral edema, warm, nontender. Eyes: Conjunctivae clear without exudates or hemorrhage Neck: Supple, no carotid bruits. Pulmonary: Clear to auscultation bilaterally   NEUROLOGICAL EXAM:  MENTAL STATUS: Speech/Cognition: Awake, alert, normal speech, oriented to history taking and casual conversation.  CRANIAL NERVES: CN II: Visual fields are full to confrontation.  Pupils are round equal and briskly reactive to light. CN III, IV, VI: extraocular movement are normal. No ptosis. CN V: Facial sensation is intact  to light touch. CN VII: Face is symmetric with normal eye closure and smile. CN VIII: Hearing is normal to casual conversation CN IX, X: Palate elevates symmetrically. Phonation is normal. CN XI: Head turning and shoulder shrug are intact CN XII: Tongue is midline with normal movements and no atrophy.  MOTOR: Muscle bulk and tone are normal. Muscle strength is normal.  REFLEXES: Reflexes are 2  and symmetric at the biceps, triceps, knees and ankles. Plantar responses are flexor.  SENSORY: Intact to light touch, pinprick, positional and vibratory sensation at fingers and toes.  COORDINATION: There is no trunk or limb ataxia.    GAIT/STANCE: Posture is normal. Gait is steady with normal steps, base, arm swing and turning.    DIAGNOSTIC DATA (LABS, IMAGING, TESTING) - I reviewed patient records, labs, notes, testing and imaging myself where available.  Lab Results  Component Value Date   WBC 9.4 07/22/2019   HGB 16.2 07/22/2019   HCT 46.8 07/22/2019   MCV 92 07/22/2019   PLT 242 07/22/2019      Component Value Date/Time   NA 141 07/22/2019 0904   K 4.3 07/22/2019 0904   CL 104 07/22/2019 0904   CO2 24 07/22/2019 0904   GLUCOSE 120 (H) 07/22/2019 0904   GLUCOSE 119 (H) 07/11/2016 0649   BUN 12 07/22/2019 0904   CREATININE 1.01 07/22/2019 0904   CALCIUM 9.6 07/22/2019 0904   PROT 7.6 07/22/2019 0904   ALBUMIN 4.7 07/22/2019 0904   AST 23 07/22/2019 0904   ALT 31 07/22/2019 0904   ALKPHOS 100 07/22/2019 0904   BILITOT 0.5 07/22/2019 0904   GFRNONAA 83 07/22/2019 0904   GFRAA 96 07/22/2019 0904    ASSESSMENT AND PLAN 54 y.o. year old male    Status post large sided left meningioma resection in February 2018, high risk for partial seizure, history of recurrent spells of transient memory lapse  Evidence of encephalomalacia involving left temporal lobe and left frontal orbicular  High risk for partial seizure (previously, episode of memory lapse, even bowel  incontinence, with associated confusion)  Denies any recent episode of seizure  Has responded well to lamotrigine, continue lamotrigine 100 mg twice a day  EEG was normal in June 2019  Migraine  Doing well, only occasionally headache,  Continue nortriptyline 20 mg at bedtime  Increase  Imitrex to 50 mg as needed  Marcial Pacas, M.D. Ph.D.  [facility] [place], [place] [address] Phone: [phone] Fax:      [phone]

## 2020-01-21 ENCOUNTER — Ambulatory Visit: Payer: Medicare (Managed Care) | Admitting: Neurology

## 2020-06-04 DIAGNOSIS — J309 Allergic rhinitis, unspecified: Secondary | ICD-10-CM | POA: Diagnosis not present

## 2020-06-04 DIAGNOSIS — G4700 Insomnia, unspecified: Secondary | ICD-10-CM | POA: Diagnosis not present

## 2020-06-04 DIAGNOSIS — K141 Geographic tongue: Secondary | ICD-10-CM | POA: Diagnosis not present

## 2020-06-04 DIAGNOSIS — I10 Essential (primary) hypertension: Secondary | ICD-10-CM | POA: Diagnosis not present

## 2020-06-04 DIAGNOSIS — I69320 Aphasia following cerebral infarction: Secondary | ICD-10-CM | POA: Diagnosis not present

## 2020-06-04 DIAGNOSIS — G43909 Migraine, unspecified, not intractable, without status migrainosus: Secondary | ICD-10-CM | POA: Diagnosis not present

## 2020-06-04 DIAGNOSIS — Z86018 Personal history of other benign neoplasm: Secondary | ICD-10-CM | POA: Diagnosis not present

## 2020-08-26 ENCOUNTER — Other Ambulatory Visit: Payer: Self-pay | Admitting: Emergency Medicine

## 2020-08-26 MED ORDER — NORTRIPTYLINE HCL 10 MG PO CAPS
20.0000 mg | ORAL_CAPSULE | Freq: Every day | ORAL | 3 refills | Status: DC
Start: 2020-08-26 — End: 2021-01-20

## 2020-09-08 DIAGNOSIS — Z86010 Personal history of colonic polyps: Secondary | ICD-10-CM | POA: Diagnosis not present

## 2020-09-08 DIAGNOSIS — D120 Benign neoplasm of cecum: Secondary | ICD-10-CM | POA: Diagnosis not present

## 2020-09-10 DIAGNOSIS — D120 Benign neoplasm of cecum: Secondary | ICD-10-CM | POA: Diagnosis not present

## 2020-11-06 DIAGNOSIS — I69320 Aphasia following cerebral infarction: Secondary | ICD-10-CM | POA: Diagnosis not present

## 2020-11-06 DIAGNOSIS — G4700 Insomnia, unspecified: Secondary | ICD-10-CM | POA: Diagnosis not present

## 2020-11-06 DIAGNOSIS — Z86018 Personal history of other benign neoplasm: Secondary | ICD-10-CM | POA: Diagnosis not present

## 2020-11-06 DIAGNOSIS — G43909 Migraine, unspecified, not intractable, without status migrainosus: Secondary | ICD-10-CM | POA: Diagnosis not present

## 2020-11-06 DIAGNOSIS — J309 Allergic rhinitis, unspecified: Secondary | ICD-10-CM | POA: Diagnosis not present

## 2020-11-06 DIAGNOSIS — K141 Geographic tongue: Secondary | ICD-10-CM | POA: Diagnosis not present

## 2020-11-06 DIAGNOSIS — I10 Essential (primary) hypertension: Secondary | ICD-10-CM | POA: Diagnosis not present

## 2020-12-22 DIAGNOSIS — R430 Anosmia: Secondary | ICD-10-CM | POA: Diagnosis not present

## 2020-12-22 DIAGNOSIS — J329 Chronic sinusitis, unspecified: Secondary | ICD-10-CM | POA: Diagnosis not present

## 2020-12-22 DIAGNOSIS — Z87891 Personal history of nicotine dependence: Secondary | ICD-10-CM | POA: Diagnosis not present

## 2020-12-22 DIAGNOSIS — J342 Deviated nasal septum: Secondary | ICD-10-CM | POA: Diagnosis not present

## 2020-12-22 DIAGNOSIS — R0981 Nasal congestion: Secondary | ICD-10-CM | POA: Diagnosis not present

## 2021-01-04 ENCOUNTER — Ambulatory Visit: Payer: Medicare (Managed Care) | Admitting: Neurology

## 2021-01-11 ENCOUNTER — Other Ambulatory Visit: Payer: Self-pay | Admitting: Neurology

## 2021-01-11 NOTE — Telephone Encounter (Signed)
Rx refilled.

## 2021-01-19 NOTE — Progress Notes (Signed)
HISTORY OF PRESENT ILLNESS: Kyle Chan is a 55 year old male, accompanied by his wife [name], seen in refer by his primary care physician Dr. Antony Contras, initial evaluation was on June 26, 2017.   He had left frontotemporal craniotomy for resection of meningioma on March 02, 2016.  He presented with sudden onset of confusion prior to the surgery,   MRI of the brain with without contrast on March 02, 2016 prior to surgery showed large left convexity meningioma with underlying mass-effect on the left frontal and temporal lobes, adjacent edema, 6.1 x 5.9 x 4.2 cm, with 10 mm rightward midline shift at the level of the foraminal of [place].   Post surgically, he was treated with Keppra 500 mg twice a day for few months, he had no recurrent seizure, Keppra was stopped.   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, he now complains of memory loss, frequent headaches, confusion, difficulty sleeping, he also has language difficulty, this including comprehensive difficulty, and expressive aphasia, he also describes episode of suddenly lapse of memory while watching TV, could not follow up that episode, there was 3 episode of sudden onset of bowel incontinence, with mild confusion,   He now complains of daily moderate headaches, bilateral frontal, oftentimes it would be exacerbated to a more severe 7 out of 10 headache lasting for few minutes, multiple episode during the day, he also complains of intermittent right leg arm numbness, weakness   Positive surgical MRI of the brain with without contrast in May 2019 showed no residual meningioma, encephalomalacia involving left lateral temporal lobe and left frontal orbiculum,   UPDATE September 11 2017: He is very happy with lamotrigine 100 mg twice a day, no longer has headaches, no clinical seizure noted, And there was no significant side effect noted.   UPDATE Mar 14 2018: He no  longer has spells of sudden onset memory loss since lamotrigine 100mg  bid, Reported history of frequent headaches since his head injury concussion few years ago, now complains of increased headache, frequent pressure headaches, also multiple episode of sharp traveling shooting pain, he has been taking almost daily Excedrin Migraine with some help, during intense headaches he does has noise light sensitivity, movement made it worse,   He also complains of difficulty sleeping sometimes.  UPDATE Jan 02 2020: He is overall doing very well, tolerating lamotrigine 100 mg twice a day, no longer have seizure, his migraine is under excellent control, Imitrex 25 mg as needed works well most of the time, but still take a couple hours for his headache to go away  Laboratory evaluation in June 2021: Lamotrigine level 4.0, normal CMP, glucose of 120, CBC, hemoglobin 15.7  Update January 20, 2021 SS: No seizures since last seen remains on Lamictal 100 mg twice daily, tolerating well. Still has sharp pains mostly on right side, could be left. Mild to moderate, will stay for few minutes, if doesn't take anything will stay, takes Imitrex with usually good benefit. Didn't know he could repeat dosing. Overall symptoms stable. Lifelong trouble sleeping. On nortriptyline. CMP reviewed okay from PCP Oct 2022. Urinary frequency issues, need to follow up urology. Drives short distance.  REVIEW OF SYSTEMS: Out of a complete 14 system review of symptoms, the patient complains only of the following symptoms, and all other reviewed systems are negative.  See hpi  ALLERGIES: Allergies  Allergen Reactions   Bee Venom Swelling  HOME MEDICATIONS: Outpatient Medications Prior to Visit  Medication Sig Dispense Refill   amLODipine (NORVASC) 10 MG tablet Take 10 mg by mouth every morning.      aspirin EC 81 MG tablet Take 81 mg by mouth daily.     lamoTRIgine (LAMICTAL) 100 MG tablet Take 1 tablet (100 mg total) by mouth 2  (two) times daily. 180 tablet 3   nortriptyline (PAMELOR) 10 MG capsule Take 2 capsules (20 mg total) by mouth at bedtime. 180 capsule 3   Omega-3 Fatty Acids (OMEGA-3 FISH OIL PO) Take 1 capsule by mouth daily.     SUMAtriptan (IMITREX) 50 MG tablet TAKE 1 TABLET BY MOUTH AT ONSET OF MIGRAINE. MAY REPEAT IN 2 HOURS IF NEEDED. MAXIMUM DAILY DOSE OF 2 TABLETS**30 DAYS SUPPLY** 9 tablet 11   telmisartan (MICARDIS) 80 MG tablet Take 80 mg by mouth daily.  1   No facility-administered medications prior to visit.    PAST MEDICAL HISTORY: Past Medical History:  Diagnosis Date   Back pain    Benign non-nodular prostatic hyperplasia with lower urinary tract symptoms    CVA (cerebral vascular accident) ([place])    Expressive aphasia    confusion/cognitive due to left frontal meningioma s/p  resection 03-03-2016   History of cerebral meningioma    S/P  RESECTION 03-03-2016---  CAUSED EXPRESSIVE APHASIA  AND THIS CONTINUES   History of diverticulitis    History of gastroesophageal reflux (GERD)    History of meningioma    History of penile cancer    Hypertension    Lower urinary tract symptoms (LUTS)    rapaflo has helped symptoms   Memory loss    Overactive bladder    Poor historian    due to expressive aphasia/  congitive impairment   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    Wears dentures    upper   Wears glasses     PAST SURGICAL HISTORY: Past Surgical History:  Procedure Laterality Date   CRANIOTOMY Left 03/03/2016   Procedure: LEFT CRANIOTOMY FOR BRAIN TUMOR;  Surgeon: Eustace Moore, MD;  Location: [facility];  Service: Neurosurgery;  Laterality: Left;   CRANIOTOMY Left 03/21/2016   Procedure: IRRIGATION AND DEBRIDMENT OF CRANIOTOMY  AND EXPLORATION OF WOUND;  Surgeon: Eustace Moore, MD;  Location: [facility];  Service: Neurosurgery;  Laterality: Left;  IRRIGATION AND DEBRIDMENT OF CRANIOTOMY  AND EXPLORATION OF WOUND   NASAL RECONSTRUCTION WITH SEPTAL REPAIR N/A 11/18/2012    Procedure: NASAL RECONSTRUCTION WITH SEPTAL REPAIR;  Surgeon: Melida Quitter, MD;  Location: [facility];  Service: ENT;  Laterality: N/A;   PENILE BIOPSY N/A 07/11/2016   Procedure: EXCISION OF PENILE LESION;  Surgeon: Kathie Rhodes, MD;  Location: [facility];  Service: Urology;  Laterality: N/A;    FAMILY HISTORY: Family History  Problem Relation Age of Onset   Hypertension Mother    Abdominal Wall Hernia Mother    Diabetes Mother    Colon cancer Mother    Hypertension Father     SOCIAL HISTORY: Social History   Socioeconomic History   Marital status: Married    Spouse name: Not on file   Number of children: 0   Years of education: 12   Highest education level: High school graduate  Occupational History   Occupation: currently trying to get disablility  Tobacco Use   Smoking status: Former    Years: 27.00    Types: Cigarettes    Quit date: 07/04/2008  Years since quitting: 12.5   Smokeless tobacco: Never  Vaping Use   Vaping Use: Never used  Substance and Sexual Activity   Alcohol use: Yes    Comment: social - one drink every 2-3 months   Drug use: No   Sexual activity: Not on file  Other Topics Concern   Not on file  Social History Narrative   Lives at home with his wife.   Right-handed.   Caffeine use:  4 cups per day.   Social Determinants of Health   Financial Resource Strain: Not on file  Food Insecurity: Not on file  Transportation Needs: Not on file  Physical Activity: Not on file  Stress: Not on file  Social Connections: Not on file  Intimate Partner Violence: Not on file   PHYSICAL EXAM  Vitals:   01/20/21 0908  BP: (!) 145/87  Pulse: 83  Weight: 232 lb (105.2 kg)  Height: 5\' 7"  (1.702 m)    Body mass index is 36.34 kg/m.  Physical Exam  General: The patient is alert and cooperative at the time of the examination.  Well appearing, good historian.  Skin: No significant peripheral edema is noted.  Neurologic Exam  Mental  status: The patient is alert and oriented x 3 at the time of the examination. The patient has apparent normal recent and remote memory, with an apparently normal attention span and concentration ability.  Cranial nerves: Facial symmetry is present. Speech is normal, no aphasia or dysarthria is noted. Extraocular movements are full. Visual fields are full.  Motor: The patient has good strength in all 4 extremities.  Sensory examination: Soft touch sensation is symmetric on the face, arms, and legs.  Coordination: The patient has good finger-nose-finger and heel-to-shin bilaterally.  Gait and station: The patient has a normal gait. Tandem gait is normal.   Reflexes: Deep tendon reflexes are symmetric.    DIAGNOSTIC DATA (LABS, IMAGING, TESTING) - I reviewed patient records, labs, notes, testing and imaging myself where available.  Lab Results  Component Value Date   WBC 9.4 07/22/2019   HGB 16.2 07/22/2019   HCT 46.8 07/22/2019   MCV 92 07/22/2019   PLT 242 07/22/2019      Component Value Date/Time   NA 141 07/22/2019 0904   K 4.3 07/22/2019 0904   CL 104 07/22/2019 0904   CO2 24 07/22/2019 0904   GLUCOSE 120 (H) 07/22/2019 0904   GLUCOSE 119 (H) 07/11/2016 0649   BUN 12 07/22/2019 0904   CREATININE 1.01 07/22/2019 0904   CALCIUM 9.6 07/22/2019 0904   PROT 7.6 07/22/2019 0904   ALBUMIN 4.7 07/22/2019 0904   AST 23 07/22/2019 0904   ALT 31 07/22/2019 0904   ALKPHOS 100 07/22/2019 0904   BILITOT 0.5 07/22/2019 0904   GFRNONAA 83 07/22/2019 0904   GFRAA 96 07/22/2019 0904    ASSESSMENT AND PLAN 55 y.o. year old male    Status post large sided left meningioma resection in February 2018, high risk for partial seizure, history of recurrent spells of transient memory lapse  -No recurrent spells, doing well on Lamictal -Continue Lamictal 100 mg twice a day -Evidence of encephalomalacia involving left temporal lobe and left frontal orbicular, last imaging was 2018 -High  risk for partial seizure (previously, episode of memory lapse, even bowel incontinence, with associated confusion) -EEG was normal in June 2019  Migraine  -Stable, occasional headache -Continue nortriptyline 20 mg at bedtime -Continue Imitrex 50 mg as needed, can repeat in 2 hours  if needed -Follow-up in 1 year or sooner if needed, call for any new or worsening symptoms  Butler Denmark, Laqueta Jean, DNP  [facility] [address], [place], [place] [address] [phone]

## 2021-01-20 ENCOUNTER — Encounter: Payer: Self-pay | Admitting: Neurology

## 2021-01-20 ENCOUNTER — Ambulatory Visit: Payer: Medicare Other | Admitting: Neurology

## 2021-01-20 VITALS — BP 145/87 | HR 83 | Ht 67.0 in | Wt 232.0 lb

## 2021-01-20 DIAGNOSIS — Z9889 Other specified postprocedural states: Secondary | ICD-10-CM | POA: Diagnosis not present

## 2021-01-20 DIAGNOSIS — R519 Headache, unspecified: Secondary | ICD-10-CM

## 2021-01-20 DIAGNOSIS — Z8603 Personal history of neoplasm of uncertain behavior: Secondary | ICD-10-CM

## 2021-01-20 DIAGNOSIS — G40209 Localization-related (focal) (partial) symptomatic epilepsy and epileptic syndromes with complex partial seizures, not intractable, without status epilepticus: Secondary | ICD-10-CM | POA: Diagnosis not present

## 2021-01-20 MED ORDER — LAMOTRIGINE 100 MG PO TABS: 100.0000 mg | ORAL_TABLET | Freq: Two times a day (BID) | ORAL | 4 refills | Status: DC

## 2021-01-20 MED ORDER — NORTRIPTYLINE HCL 10 MG PO CAPS: 20.0000 mg | ORAL_CAPSULE | Freq: Every day | ORAL | 3 refills | Status: DC

## 2021-01-20 NOTE — Patient Instructions (Signed)
Great to see you today! Continue current medications See you back in 1 year

## 2021-01-26 ENCOUNTER — Other Ambulatory Visit: Payer: Self-pay | Admitting: Neurology

## 2021-01-26 NOTE — Progress Notes (Signed)
Chart reviewed, agree above plan ?

## 2021-05-05 DIAGNOSIS — I10 Essential (primary) hypertension: Secondary | ICD-10-CM | POA: Diagnosis not present

## 2021-05-05 DIAGNOSIS — Z136 Encounter for screening for cardiovascular disorders: Secondary | ICD-10-CM | POA: Diagnosis not present

## 2021-05-05 DIAGNOSIS — R972 Elevated prostate specific antigen [PSA]: Secondary | ICD-10-CM | POA: Diagnosis not present

## 2021-05-05 DIAGNOSIS — Z13220 Encounter for screening for lipoid disorders: Secondary | ICD-10-CM | POA: Diagnosis not present

## 2021-05-07 DIAGNOSIS — N529 Male erectile dysfunction, unspecified: Secondary | ICD-10-CM | POA: Diagnosis not present

## 2021-05-07 DIAGNOSIS — I10 Essential (primary) hypertension: Secondary | ICD-10-CM | POA: Diagnosis not present

## 2021-05-07 DIAGNOSIS — J309 Allergic rhinitis, unspecified: Secondary | ICD-10-CM | POA: Diagnosis not present

## 2021-05-07 DIAGNOSIS — G40209 Localization-related (focal) (partial) symptomatic epilepsy and epileptic syndromes with complex partial seizures, not intractable, without status epilepticus: Secondary | ICD-10-CM | POA: Diagnosis not present

## 2021-05-07 DIAGNOSIS — G4700 Insomnia, unspecified: Secondary | ICD-10-CM | POA: Diagnosis not present

## 2021-05-07 DIAGNOSIS — R972 Elevated prostate specific antigen [PSA]: Secondary | ICD-10-CM | POA: Diagnosis not present

## 2021-05-07 DIAGNOSIS — N400 Enlarged prostate without lower urinary tract symptoms: Secondary | ICD-10-CM | POA: Diagnosis not present

## 2021-05-07 DIAGNOSIS — Z0000 Encounter for general adult medical examination without abnormal findings: Secondary | ICD-10-CM | POA: Diagnosis not present

## 2021-05-07 DIAGNOSIS — I69320 Aphasia following cerebral infarction: Secondary | ICD-10-CM | POA: Diagnosis not present

## 2021-05-31 DIAGNOSIS — R3915 Urgency of urination: Secondary | ICD-10-CM | POA: Diagnosis not present

## 2021-05-31 DIAGNOSIS — N401 Enlarged prostate with lower urinary tract symptoms: Secondary | ICD-10-CM | POA: Diagnosis not present

## 2021-05-31 DIAGNOSIS — R972 Elevated prostate specific antigen [PSA]: Secondary | ICD-10-CM | POA: Diagnosis not present

## 2021-06-02 DIAGNOSIS — H5210 Myopia, unspecified eye: Secondary | ICD-10-CM | POA: Diagnosis not present

## 2021-06-02 DIAGNOSIS — M25562 Pain in left knee: Secondary | ICD-10-CM | POA: Diagnosis not present

## 2021-07-02 DIAGNOSIS — N138 Other obstructive and reflux uropathy: Secondary | ICD-10-CM | POA: Diagnosis not present

## 2021-07-02 DIAGNOSIS — N410 Acute prostatitis: Secondary | ICD-10-CM | POA: Diagnosis not present

## 2021-07-02 DIAGNOSIS — N401 Enlarged prostate with lower urinary tract symptoms: Secondary | ICD-10-CM | POA: Diagnosis not present

## 2021-07-02 DIAGNOSIS — N411 Chronic prostatitis: Secondary | ICD-10-CM | POA: Diagnosis not present

## 2021-07-02 DIAGNOSIS — R972 Elevated prostate specific antigen [PSA]: Secondary | ICD-10-CM | POA: Diagnosis not present

## 2021-08-16 DIAGNOSIS — M1712 Unilateral primary osteoarthritis, left knee: Secondary | ICD-10-CM | POA: Diagnosis not present

## 2021-08-23 DIAGNOSIS — M25562 Pain in left knee: Secondary | ICD-10-CM | POA: Diagnosis not present

## 2021-08-26 DIAGNOSIS — M25562 Pain in left knee: Secondary | ICD-10-CM | POA: Diagnosis not present

## 2021-09-06 ENCOUNTER — Other Ambulatory Visit: Payer: Self-pay

## 2021-09-06 MED ORDER — NORTRIPTYLINE HCL 10 MG PO CAPS: 20.0000 mg | ORAL_CAPSULE | Freq: Every day | ORAL | 1 refills | Status: DC

## 2021-09-06 NOTE — Progress Notes (Signed)
Rx refilled.

## 2021-09-21 DIAGNOSIS — U071 COVID-19: Secondary | ICD-10-CM | POA: Diagnosis not present

## 2021-10-06 DIAGNOSIS — S83242A Other tear of medial meniscus, current injury, left knee, initial encounter: Secondary | ICD-10-CM | POA: Diagnosis not present

## 2021-10-13 ENCOUNTER — Telehealth: Payer: Self-pay

## 2021-10-13 NOTE — Telephone Encounter (Signed)
Received medical clearance from [facility] on pt's up coming left knee scope PMM/Chondroplasty.  Last visit with our office 01/14/2021

## 2021-10-13 NOTE — Telephone Encounter (Signed)
I have faxed medical clearance to # [phone], confirmation received.

## 2021-10-13 NOTE — Telephone Encounter (Signed)
Surgical clearance was signed.  Last seen in December 2022.  Doing well on Lamictal 100 mg twice a day, no recent seizures.  Advised to allow him to take his seizure medicine on the day of the surgery.

## 2021-10-18 NOTE — Telephone Encounter (Signed)
Wife is asking if the fax from [facility] can be sent to pt's provider again, they have not received the fax from Judson Roch, NP Just yet.

## 2021-10-18 NOTE — Telephone Encounter (Signed)
Faxed again today.

## 2021-11-08 DIAGNOSIS — I69320 Aphasia following cerebral infarction: Secondary | ICD-10-CM | POA: Diagnosis not present

## 2021-11-08 DIAGNOSIS — G43909 Migraine, unspecified, not intractable, without status migrainosus: Secondary | ICD-10-CM | POA: Diagnosis not present

## 2021-11-08 DIAGNOSIS — N529 Male erectile dysfunction, unspecified: Secondary | ICD-10-CM | POA: Diagnosis not present

## 2021-11-08 DIAGNOSIS — R4589 Other symptoms and signs involving emotional state: Secondary | ICD-10-CM | POA: Diagnosis not present

## 2021-11-08 DIAGNOSIS — G40209 Localization-related (focal) (partial) symptomatic epilepsy and epileptic syndromes with complex partial seizures, not intractable, without status epilepticus: Secondary | ICD-10-CM | POA: Diagnosis not present

## 2021-11-08 DIAGNOSIS — I10 Essential (primary) hypertension: Secondary | ICD-10-CM | POA: Diagnosis not present

## 2021-11-08 DIAGNOSIS — N400 Enlarged prostate without lower urinary tract symptoms: Secondary | ICD-10-CM | POA: Diagnosis not present

## 2021-11-08 DIAGNOSIS — G4700 Insomnia, unspecified: Secondary | ICD-10-CM | POA: Diagnosis not present

## 2021-11-08 DIAGNOSIS — K141 Geographic tongue: Secondary | ICD-10-CM | POA: Diagnosis not present

## 2021-11-09 DIAGNOSIS — S83232A Complex tear of medial meniscus, current injury, left knee, initial encounter: Secondary | ICD-10-CM | POA: Diagnosis not present

## 2021-11-09 DIAGNOSIS — M94262 Chondromalacia, left knee: Secondary | ICD-10-CM | POA: Diagnosis not present

## 2021-11-09 DIAGNOSIS — G8918 Other acute postprocedural pain: Secondary | ICD-10-CM | POA: Diagnosis not present

## 2021-11-16 DIAGNOSIS — M25562 Pain in left knee: Secondary | ICD-10-CM | POA: Diagnosis not present

## 2021-11-16 DIAGNOSIS — M25662 Stiffness of left knee, not elsewhere classified: Secondary | ICD-10-CM | POA: Diagnosis not present

## 2021-11-17 ENCOUNTER — Telehealth: Payer: Self-pay | Admitting: Neurology

## 2021-11-17 NOTE — Telephone Encounter (Signed)
Attempted to call pt, LVM for call back  °

## 2021-11-17 NOTE — Telephone Encounter (Signed)
Pt wife is calling. States Pt needs a sooner appointment because he is very confuse and forget things really easy. She stated she thinks he needs a scan done asap. She is requesting a call back from nurse.

## 2021-11-23 NOTE — Telephone Encounter (Signed)
Attempted to call pt, LVM for call back  °

## 2021-12-13 ENCOUNTER — Ambulatory Visit: Payer: Medicare HMO | Admitting: Neurology

## 2021-12-13 ENCOUNTER — Encounter: Payer: Self-pay | Admitting: Neurology

## 2021-12-13 VITALS — BP 152/94 | HR 82 | Ht 67.0 in | Wt 226.0 lb

## 2021-12-13 DIAGNOSIS — Z8603 Personal history of neoplasm of uncertain behavior: Secondary | ICD-10-CM | POA: Diagnosis not present

## 2021-12-13 DIAGNOSIS — R799 Abnormal finding of blood chemistry, unspecified: Secondary | ICD-10-CM | POA: Diagnosis not present

## 2021-12-13 DIAGNOSIS — G40209 Localization-related (focal) (partial) symptomatic epilepsy and epileptic syndromes with complex partial seizures, not intractable, without status epilepticus: Secondary | ICD-10-CM | POA: Diagnosis not present

## 2021-12-13 DIAGNOSIS — Z9889 Other specified postprocedural states: Secondary | ICD-10-CM

## 2021-12-13 DIAGNOSIS — R7989 Other specified abnormal findings of blood chemistry: Secondary | ICD-10-CM | POA: Diagnosis not present

## 2021-12-13 MED ORDER — LAMOTRIGINE 100 MG PO TABS: 150.0000 mg | ORAL_TABLET | Freq: Two times a day (BID) | ORAL | 3 refills | Status: DC

## 2021-12-13 NOTE — Progress Notes (Signed)
Chief Complaint  Patient presents with   New Patient (Initial Visit)    RM 12 with wife  NX Kyle Chan/Kyle Chan/[facility] @ Kyle Haste MD [phone]/memory issues/ Reports memory has decline in last few months, c/o 4-5 headaches a month       ASSESSMENT AND PLAN  Kyle Chan is a 56 y.o. male    Status post large sided left meningioma resection in February 2018, high risk for partial seizure, history of recurrent spells of transient memory lapse Worsening memory loss  MoCA examination is only 11 out of 30 today  Patient is at high risk for recurrent partial seizure,  Repeat EEG,  Discussed with patient and his wife, they also desire MRI of the brain with without contrast  Laboratory evaluations including lamotrigine level  Increase lamotrigine to 150 mg twice a day  Lengthy discussion with patient and his wife, with him at high risk for recurrent seizure, worsening memory loss, 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   MEDICAL HISTORY:  Kyle Chan is a 56 year old male, accompanied by his wife Kyle Chan, seen in refer by his primary care physician Dr. Antony Chan, initial evaluation was on June 26, 2017.   He had left frontotemporal craniotomy for resection of meningioma on March 02, 2016.  He presented with sudden onset of confusion prior to the surgery,   MRI of the brain with without contrast on March 02, 2016 prior to surgery showed large left convexity meningioma with underlying mass-effect on the left frontal and temporal lobes, adjacent edema, 6.1 x 5.9 x 4.2 cm, with 10 mm rightward midline shift at the level of the foraminal of [place].   Post surgically, he was treated with Keppra  500 mg twice a day for few months, he had no recurrent seizure, Keppra was stopped.   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, he now complains of memory loss, frequent headaches, confusion, difficulty sleeping, he also has language difficulty, this including comprehensive difficulty, and expressive aphasia, he also describes episode of suddenly lapse of memory while watching TV, could not follow up that episode, there was 3 episode of sudden onset of bowel incontinence, with mild confusion,   He now complains of daily moderate headaches, bilateral frontal, oftentimes it would be exacerbated to a more severe 7 out of 10 headache lasting for few minutes, multiple episode during the day, he also complains of intermittent right leg arm numbness, weakness   Positive surgical MRI of the brain with without contrast in May 2019 showed no residual meningioma, encephalomalacia involving left lateral temporal lobe and left frontal orbiculum,   UPDATE September 11 2017: He is very happy with lamotrigine 100 mg twice a day, no longer has headaches, no clinical seizure noted, And there was no significant side effect noted.   UPDATE Mar 14 2018: He no longer has spells of sudden onset memory loss since lamotrigine '100mg'$  bid, Reported history of frequent headaches since his head injury concussion few years ago, now complains of increased headache, frequent pressure headaches, also multiple episode of sharp traveling shooting pain, he has been taking almost daily Excedrin Migraine with some help, during intense headaches he does  has noise light sensitivity, movement made it worse,   He also complains of difficulty sleeping sometimes.   UPDATE Jan 02 2020: He is overall doing very well, tolerating lamotrigine 100 mg twice a day, no longer have seizure, his migraine is under excellent control, Imitrex 25 mg as needed works well most of the time, but still  take a couple hours for his headache to go away   Laboratory evaluation in June 2021: Lamotrigine level 4.0, normal CMP, glucose of 120, CBC, hemoglobin 15.7  UPDATE Dec 13 2021: He is with his wife at visit, taking lamtorigine '100mg'$  bid, occasionally forget, has to go back to take medications.  His wife is very concerned about his increased memory loss, frequent confusion spells, she gives example of November 17, 2021, she called him to check on him as a routine, patient was at gas station, confused, does not know how to pump the gas, left the gas station.  He also often get confused about directions while driving, wife said sometimes he forgot his password, do not know how to log into his account which he manages frequently    PHYSICAL EXAM:   Vitals:   12/13/21 1103  BP: (!) 152/94  Pulse: 82  Weight: 226 lb (102.5 kg)  Height: '5\' 7"'$  (1.702 m)     Body mass index is 35.4 kg/m.  PHYSICAL EXAMNIATION:  Gen: NAD, conversant, well nourised, well groomed                     Cardiovascular: Regular rate rhythm, no peripheral edema, warm, nontender. Eyes: Conjunctivae clear without exudates or hemorrhage Neck: Supple, no carotid bruits. Pulmonary: Clear to auscultation bilaterally   NEUROLOGICAL EXAM:  MENTAL STATUS: Speech/cognition: Awake, alert, oriented to history taking and casual conversation     12/13/2021   11:09 AM  Montreal Cognitive Assessment   Visuospatial/ Executive (0/5) 1  Naming (0/3) 1  Attention: Read list of digits (0/2) 1  Attention: Read list of letters (0/1) 1  Attention: Serial 7 subtraction starting at 100 (0/3) 1  Language: Repeat phrase (0/2) 1  Language : Fluency (0/1) 0  Abstraction (0/2) 1  Delayed Recall (0/5) 0  Orientation (0/6) 3  Total 10  Adjusted Score (based on education) 11    CRANIAL NERVES: CN II: Visual fields are full to confrontation. Pupils are round equal and briskly reactive to light. CN III, IV, VI: extraocular  movement are normal. No ptosis. CN V: Facial sensation is intact to light touch CN VII: Face is symmetric with normal eye closure  CN VIII: Hearing is normal to causal conversation. CN IX, X: Phonation is normal. CN XI: Head turning and shoulder shrug are intact  MOTOR: There is no pronator drift of out-stretched arms. Muscle bulk and tone are normal. Muscle strength is normal.  REFLEXES: Reflexes are 2+ and symmetric at the biceps, triceps, knees, and ankles. Plantar responses are flexor.  SENSORY: Intact to light touch, pinprick and vibratory sensation are intact in fingers and toes.  COORDINATION: There is no trunk or limb dysmetria noted.  GAIT/STANCE: Posture is normal. Gait is steady with normal  steps  REVIEW OF SYSTEMS:  Full 14 system review of systems performed and notable only for as above All other review of systems were negative.   ALLERGIES: Allergies  Allergen Reactions   Bee Venom Swelling    HOME MEDICATIONS: Current Outpatient Medications  Medication Sig Dispense Refill   amLODipine (NORVASC) 10 MG tablet Take  10 mg by mouth every morning.      aspirin EC 81 MG tablet Take 81 mg by mouth daily.     lamoTRIgine (LAMICTAL) 100 MG tablet Take 1 tablet (100 mg total) by mouth 2 (two) times daily. 180 tablet 4   nortriptyline (PAMELOR) 10 MG capsule Take 2 capsules (20 mg total) by mouth at bedtime. 180 capsule 1   Omega-3 Fatty Acids (OMEGA-3 FISH OIL PO) Take 1 capsule by mouth daily.     SUMAtriptan (IMITREX) 50 MG tablet TAKE 1 TABLET BY MOUTH AT ONSET OF MIGRAINE. MAY REPEAT IN 2 HOURS IF NEEDED. MAXIMUM DAILY DOSE OF 2 TABLETS**30 DAYS SUPPLY** 9 tablet 11   telmisartan (MICARDIS) 80 MG tablet Take 80 mg by mouth daily.  1   No current facility-administered medications for this visit.    PAST MEDICAL HISTORY: Past Medical History:  Diagnosis Date   Back pain    Benign non-nodular prostatic hyperplasia with lower urinary tract symptoms    CVA  (cerebral vascular accident) ([place])    Expressive aphasia    confusion/cognitive due to left frontal meningioma s/p  resection 03-03-2016   History of cerebral meningioma    S/P  RESECTION 03-03-2016---  CAUSED EXPRESSIVE APHASIA  AND THIS CONTINUES   History of diverticulitis    History of gastroesophageal reflux (GERD)    History of meningioma    History of penile cancer    Hypertension    Lower urinary tract symptoms (LUTS)    rapaflo has helped symptoms   Memory loss    Overactive bladder    Poor historian    due to expressive aphasia/  congitive impairment   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    Wears dentures    upper   Wears glasses     PAST SURGICAL HISTORY: Past Surgical History:  Procedure Laterality Date   CRANIOTOMY Left 03/03/2016   Procedure: LEFT CRANIOTOMY FOR BRAIN TUMOR;  Surgeon: Eustace Moore, MD;  Location: [facility];  Service: Neurosurgery;  Laterality: Left;   CRANIOTOMY Left 03/21/2016   Procedure: IRRIGATION AND DEBRIDMENT OF CRANIOTOMY  AND EXPLORATION OF WOUND;  Surgeon: Eustace Moore, MD;  Location: [facility];  Service: Neurosurgery;  Laterality: Left;  IRRIGATION AND DEBRIDMENT OF CRANIOTOMY  AND EXPLORATION OF WOUND   NASAL RECONSTRUCTION WITH SEPTAL REPAIR N/A 11/18/2012   Procedure: NASAL RECONSTRUCTION WITH SEPTAL REPAIR;  Surgeon: Melida Quitter, MD;  Location: [facility];  Service: ENT;  Laterality: N/A;   PENILE BIOPSY N/A 07/11/2016   Procedure: EXCISION OF PENILE LESION;  Surgeon: Kathie Rhodes, MD;  Location: [facility];  Service: Urology;  Laterality: N/A;    FAMILY HISTORY: Family History  Problem Relation Age of Onset   Hypertension Mother    Abdominal Wall Hernia Mother    Diabetes Mother    Colon cancer Mother    Hypertension Father     SOCIAL HISTORY: Social History   Socioeconomic History   Marital status: Married    Spouse name: Not on file   Number of children: 0   Years of education: 12   Highest  education level: High school graduate  Occupational History   Occupation: currently trying to get disablility  Tobacco Use   Smoking status: Former    Years: 27.00    Types: Cigarettes    Quit date: 07/04/2008    Years since quitting: 13.4   Smokeless tobacco: Never  Vaping Use   Vaping Use:  Never used  Substance and Sexual Activity   Alcohol use: Yes    Comment: social - one drink every 2-3 months   Drug use: No   Sexual activity: Not on file  Other Topics Concern   Not on file  Social History Narrative   Lives at home with his wife.   Right-handed.   Caffeine use:  4 cups per day.   Social Determinants of Health   Financial Resource Strain: Not on file  Food Insecurity: Not on file  Transportation Needs: Not on file  Physical Activity: Not on file  Stress: Not on file  Social Connections: Not on file  Intimate Partner Violence: Not on file      Marcial Pacas, M.D. Ph.D.  [facility] [address], [place], [place] [address] Ph: [phone] Fax: [phone]  CC:  Kyle Contras, MD [address] [place],  [place] [address]  Kyle Contras, MD

## 2021-12-14 ENCOUNTER — Telehealth: Payer: Self-pay | Admitting: Neurology

## 2021-12-14 NOTE — Telephone Encounter (Signed)
Kyle Chan: [phone] exp. 12/14/21-01/13/22 sent to GI [phone]

## 2021-12-15 LAB — HIV ANTIBODY (ROUTINE TESTING W REFLEX): HIV Screen 4th Generation wRfx: NONREACTIVE

## 2021-12-15 LAB — CBC WITH DIFFERENTIAL
Basophils Absolute: 0 10*3/uL (ref 0.0–0.2)
Basos: 0 %
EOS (ABSOLUTE): 0.2 10*3/uL (ref 0.0–0.4)
Eos: 2 %
Hematocrit: 47.3 % (ref 37.5–51.0)
Hemoglobin: 16.4 g/dL (ref 13.0–17.7)
Immature Grans (Abs): 0.1 10*3/uL (ref 0.0–0.1)
Immature Granulocytes: 1 %
Lymphocytes Absolute: 2 10*3/uL (ref 0.7–3.1)
Lymphs: 21 %
MCH: 31.6 pg (ref 26.6–33.0)
MCHC: 34.7 g/dL (ref 31.5–35.7)
MCV: 91 fL (ref 79–97)
Monocytes Absolute: 0.8 10*3/uL (ref 0.1–0.9)
Monocytes: 9 %
Neutrophils Absolute: 6.4 10*3/uL (ref 1.4–7.0)
Neutrophils: 67 %
RBC: 5.19 x10E6/uL (ref 4.14–5.80)
RDW: 13.4 % (ref 11.6–15.4)
WBC: 9.5 10*3/uL (ref 3.4–10.8)

## 2021-12-15 LAB — RPR: RPR Ser Ql: NONREACTIVE

## 2021-12-15 LAB — COMPREHENSIVE METABOLIC PANEL
ALT: 29 IU/L (ref 0–44)
AST: 28 IU/L (ref 0–40)
Albumin/Globulin Ratio: 1.9 (ref 1.2–2.2)
Albumin: 5 g/dL — ABNORMAL HIGH (ref 3.8–4.9)
Alkaline Phosphatase: 88 IU/L (ref 44–121)
BUN/Creatinine Ratio: 9 (ref 9–20)
BUN: 11 mg/dL (ref 6–24)
Bilirubin Total: 0.5 mg/dL (ref 0.0–1.2)
CO2: 24 mmol/L (ref 20–29)
Calcium: 10.1 mg/dL (ref 8.7–10.2)
Chloride: 101 mmol/L (ref 96–106)
Creatinine, Ser: 1.17 mg/dL (ref 0.76–1.27)
Globulin, Total: 2.7 g/dL (ref 1.5–4.5)
Glucose: 99 mg/dL (ref 70–99)
Potassium: 3.6 mmol/L (ref 3.5–5.2)
Sodium: 142 mmol/L (ref 134–144)
Total Protein: 7.7 g/dL (ref 6.0–8.5)
eGFR: 72 mL/min/{1.73_m2} (ref >59)

## 2021-12-15 LAB — VITAMIN B12: Vitamin B-12: 616 pg/mL (ref 232–1245)

## 2021-12-15 LAB — LAMOTRIGINE LEVEL: Lamotrigine Lvl: 4.9 ug/mL (ref 2.0–20.0)

## 2021-12-15 LAB — TSH: TSH: 1.02 u[IU]/mL (ref 0.450–4.500)

## 2021-12-22 ENCOUNTER — Telehealth: Payer: Self-pay

## 2021-12-22 NOTE — Telephone Encounter (Signed)
I called the pt and advised of lab results. He verbalized understanding and appreciation for the call. He will keep EEG as scheduled.

## 2021-12-22 NOTE — Telephone Encounter (Signed)
-----   Message from Marcial Pacas, MD sent at 12/20/2021  4:12 PM EST ----- Please call patient laboratory evaluation showed no significant abnormalities.

## 2021-12-23 ENCOUNTER — Ambulatory Visit: Payer: Medicare HMO | Admitting: Neurology

## 2021-12-23 DIAGNOSIS — G40209 Localization-related (focal) (partial) symptomatic epilepsy and epileptic syndromes with complex partial seizures, not intractable, without status epilepticus: Secondary | ICD-10-CM

## 2021-12-23 DIAGNOSIS — Z8603 Personal history of neoplasm of uncertain behavior: Secondary | ICD-10-CM

## 2021-12-23 DIAGNOSIS — Z9889 Other specified postprocedural states: Secondary | ICD-10-CM

## 2021-12-29 NOTE — Procedures (Signed)
   HISTORY: 56 year old male, history of left hemisphere meningioma resection  TECHNIQUE:  This is a routine 16 channel EEG recording with one channel devoted to a limited EKG recording.  It was performed during wakefulness, drowsiness and asleep.  Hyperventilation and photic stimulation were performed as activating procedures.  There are minimum muscle and movement artifact noted.  Upon maximum arousal, posterior dominant waking rhythm consistent of rhythmic alpha range activity.. Activities are symmetric over the bilateral posterior derivations and attenuated with eye opening.  Hyperventilation produced mild/moderate buildup with higher amplitude and the slower activities noted.  Photic stimulation did not alter the tracing.  During EEG recording, patient developed drowsiness and no deep stage of sleep was achieved  During EEG recording, there was no epileptiform discharge noted.  EKG demonstrate sinus rhythm, with heart rate of 64 bpm  CONCLUSION: This is a  normal awake EEG.  There is no electrodiagnostic evidence of epileptiform discharge.  Marcial Pacas, M.D. Ph.D.  [facility] [place], [place] [address] Phone: [phone] Fax:      [phone]

## 2021-12-31 ENCOUNTER — Ambulatory Visit
Admission: RE | Admit: 2021-12-31 | Discharge: 2021-12-31 | Disposition: A | Discharge status: Home / Self Care | Payer: Medicare HMO | Source: Ambulatory Visit | Attending: Neurology | Admitting: Neurology

## 2021-12-31 DIAGNOSIS — Z9889 Other specified postprocedural states: Secondary | ICD-10-CM

## 2021-12-31 DIAGNOSIS — G40209 Localization-related (focal) (partial) symptomatic epilepsy and epileptic syndromes with complex partial seizures, not intractable, without status epilepticus: Secondary | ICD-10-CM

## 2021-12-31 MED ORDER — GADOPICLENOL 0.5 MMOL/ML IV SOLN
10.0000 mL | Freq: Once | INTRAVENOUS | Status: AC | PRN
  Administered 2021-12-31: 10 mL via INTRAVENOUS

## 2022-01-04 ENCOUNTER — Telehealth: Payer: Self-pay | Admitting: Neurology

## 2022-01-04 NOTE — Telephone Encounter (Signed)
Please call patient, MRI of the brain showed evidence of scar from previous brain surgery, he is at high risk for recurrent seizure and worsening memory loss  Emphasized importance of compliance with his lamotrigine IMPRESSION: This MRI of the brain with and without contrast shows the following: Remote left craniotomy for meningioma resection.  There is associated left temporal and frontal gliosis/encephalomalacia with resultant left lateral ventricle enlargement.  This appears unchanged compared to the 03/14/2017 MRI.  There is no abnormal enhancement concerning for tumor recurrence. Minimal chronic microvascular ischemic changes. No acute findings.  Normal enhancement pattern.

## 2022-01-05 NOTE — Telephone Encounter (Signed)
I called the pt and updated on MRI result. He verbalized understanding and appreciation for the call. He verbally agreed to continue his lamotrigine and will call with any seizure activity.

## 2022-01-06 DIAGNOSIS — R310 Gross hematuria: Secondary | ICD-10-CM | POA: Diagnosis not present

## 2022-01-06 DIAGNOSIS — R972 Elevated prostate specific antigen [PSA]: Secondary | ICD-10-CM | POA: Diagnosis not present

## 2022-01-12 DIAGNOSIS — R310 Gross hematuria: Secondary | ICD-10-CM | POA: Diagnosis not present

## 2022-01-25 NOTE — Progress Notes (Unsigned)
Patient: Kyle Chan Date of Birth: [birth year]  Reason for Visit: Follow up History from: Patient, wife  Primary Neurologist: Krista Blue  ASSESSMENT AND PLAN 57 y.o. year old male   1.  Status post large sided left meningioma resection February 2018 2.  High risk for partial seizure, history of recurrent spells of transient memory lapse 3.  Worsening memory loss 4.  Migraine headaches -Continue Lamictal 150 mg twice a day, today moca was 22/30, improved from 11/30 -Continue nortriptyline 20 mg, 2 capsules at bedtime for migraine prevention, Imitrex 50 mg as needed for acute headache -Have recommended against driving, consider Namenda as trial at next visit if memory continues to be an issue -Follow-up in 6 months or sooner if needed  HISTORY  Kyle Chan is a 57 year old male, accompanied by his wife Kyle Chan, seen in refer by his primary care physician Dr. Antony Contras, initial evaluation was on June 26, 2017.   He had left frontotemporal craniotomy for resection of meningioma on March 02, 2016.  He presented with sudden onset of confusion prior to the surgery,   MRI of the brain with without contrast on March 02, 2016 prior to surgery showed large left convexity meningioma with underlying mass-effect on the left frontal and temporal lobes, adjacent edema, 6.1 x 5.9 x 4.2 cm, with 10 mm rightward midline shift at the level of the foraminal of [place].   Post surgically, he was treated with Keppra 500 mg twice a day for few months, he had no recurrent seizure, Keppra was stopped.   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, he now complains of memory loss, frequent headaches, confusion, difficulty sleeping, he also has language difficulty, this including comprehensive difficulty, and expressive aphasia, he also describes episode of suddenly lapse of memory while watching TV, could not follow up that episode, there was 3  episode of sudden onset of bowel incontinence, with mild confusion,   He now complains of daily moderate headaches, bilateral frontal, oftentimes it would be exacerbated to a more severe 7 out of 10 headache lasting for few minutes, multiple episode during the day, he also complains of intermittent right leg arm numbness, weakness   Positive surgical MRI of the brain with without contrast in May 2019 showed no residual meningioma, encephalomalacia involving left lateral temporal lobe and left frontal orbiculum,   UPDATE September 11 2017: He is very happy with lamotrigine 100 mg twice a day, no longer has headaches, no clinical seizure noted, And there was no significant side effect noted.   UPDATE Mar 14 2018: He no longer has spells of sudden onset memory loss since lamotrigine '100mg'$  bid, Reported history of frequent headaches since his head injury concussion few years ago, now complains of increased headache, frequent pressure headaches, also multiple episode of sharp traveling shooting pain, he has been taking almost daily Excedrin Migraine with some help, during intense headaches he does has noise light sensitivity, movement made it worse,   He also complains of difficulty sleeping sometimes.   UPDATE Jan 02 2020: He is overall doing very well, tolerating lamotrigine 100 mg twice a day, no longer have seizure, his migraine is under excellent control, Imitrex 25 mg as needed works well most of the time, but still take a couple hours for his headache to go away   Laboratory evaluation in June 2021: Lamotrigine level 4.0, normal CMP, glucose of 120, CBC, hemoglobin  15.7   UPDATE Dec 13 2021: He is with his wife at visit, taking lamtorigine '100mg'$  bid, occasionally forget, has to go back to take medications.   His wife is very concerned about his increased memory loss, frequent confusion spells, she gives example of November 17, 2021, she called him to check on him as a routine, patient was at gas  station, confused, does not know how to pump the gas, left the gas station.   He also often get confused about directions while driving, wife said sometimes he forgot his password, do not know how to log into his account which he manages frequently  Update January 25, 2022 SS: Here with his wife, doing well on higher dose Lamictal 150 mg twice daily. Since increasing, mood is better, not getting as frustrated with memory challenges. 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 We have recommended against driving. Migraines doing well, still on nortriptyline, takes Imitrex PRN, usually takes 1-2 times a month. Trouble remembering passwords for bank accounts, word finding, he goes get frustrated when he can't remember.   Labs 12/13/21 CBC, CMP, TSH, B12, RPR were unremarkable.  Lamictal level 4.9.  EEG was normal.  MRI of the brain with and without contrast showed encephalomalacia to the left temporal and frontal area.  High risk for recurrent seizure.  REVIEW OF SYSTEMS: Out of a complete 14 system review of symptoms, the patient complains only of the following symptoms, and all other reviewed systems are negative.  See HPI  ALLERGIES: Allergies  Allergen Reactions   Bee Venom Swelling    HOME MEDICATIONS: Outpatient Medications Prior to Visit  Medication Sig Dispense Refill   amLODipine (NORVASC) 10 MG tablet Take 10 mg by mouth every morning.      aspirin EC 81 MG tablet Take 81 mg by mouth daily.     lamoTRIgine (LAMICTAL) 100 MG tablet Take 1.5 tablets (150 mg total) by mouth 2 (two) times daily. 270 tablet 3   Omega-3 Fatty Acids (OMEGA-3 FISH OIL PO) Take 1 capsule by mouth daily.     telmisartan (MICARDIS) 80 MG tablet Take 80 mg by mouth daily.  1   nortriptyline (PAMELOR) 10 MG capsule Take 2 capsules (20 mg total) by mouth at bedtime. 180 capsule 1   SUMAtriptan (IMITREX) 50 MG tablet TAKE 1 TABLET BY MOUTH AT ONSET OF MIGRAINE. MAY REPEAT IN 2 HOURS IF NEEDED. MAXIMUM  DAILY DOSE OF 2 TABLETS**30 DAYS SUPPLY** 9 tablet 11   lamoTRIgine (LAMICTAL) 100 MG tablet Take 1 tablet (100 mg total) by mouth 2 (two) times daily. 180 tablet 4   No facility-administered medications prior to visit.    PAST MEDICAL HISTORY: Past Medical History:  Diagnosis Date   Back pain    Benign non-nodular prostatic hyperplasia with lower urinary tract symptoms    CVA (cerebral vascular accident) ([place])    Expressive aphasia    confusion/cognitive due to left frontal meningioma s/p  resection 03-03-2016   History of cerebral meningioma    S/P  RESECTION 03-03-2016---  CAUSED EXPRESSIVE APHASIA  AND THIS CONTINUES   History of diverticulitis    History of gastroesophageal reflux (GERD)    History of meningioma    History of penile cancer    Hypertension    Lower urinary tract symptoms (LUTS)    rapaflo has helped symptoms   Memory loss    Overactive bladder    Poor historian    due to expressive  aphasia/  congitive impairment   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    Wears dentures    upper   Wears glasses     PAST SURGICAL HISTORY: Past Surgical History:  Procedure Laterality Date   CRANIOTOMY Left 03/03/2016   Procedure: LEFT CRANIOTOMY FOR BRAIN TUMOR;  Surgeon: Eustace Moore, MD;  Location: [place];  Service: Neurosurgery;  Laterality: Left;   CRANIOTOMY Left 03/21/2016   Procedure: IRRIGATION AND DEBRIDMENT OF CRANIOTOMY  AND EXPLORATION OF WOUND;  Surgeon: Eustace Moore, MD;  Location: [place];  Service: Neurosurgery;  Laterality: Left;  IRRIGATION AND DEBRIDMENT OF CRANIOTOMY  AND EXPLORATION OF WOUND   NASAL RECONSTRUCTION WITH SEPTAL REPAIR N/A 11/18/2012   Procedure: NASAL RECONSTRUCTION WITH SEPTAL REPAIR;  Surgeon: Melida Quitter, MD;  Location: [place];  Service: ENT;  Laterality: N/A;   PENILE BIOPSY N/A 07/11/2016   Procedure: EXCISION OF PENILE LESION;  Surgeon: Kathie Rhodes, MD;  Location: [facility];  Service: Urology;   Laterality: N/A;    FAMILY HISTORY: Family History  Problem Relation Age of Onset   Hypertension Mother    Abdominal Wall Hernia Mother    Diabetes Mother    Colon cancer Mother    Hypertension Father     SOCIAL HISTORY: Social History   Socioeconomic History   Marital status: Married    Spouse name: Not on file   Number of children: 0   Years of education: 12   Highest education level: High school graduate  Occupational History   Occupation: currently trying to get disablility  Tobacco Use   Smoking status: Former    Years: 27.00    Types: Cigarettes    Quit date: 07/04/2008    Years since quitting: 13.5   Smokeless tobacco: Never  Vaping Use   Vaping Use: Never used  Substance and Sexual Activity   Alcohol use: Yes    Comment: social - one drink every 2-3 months   Drug use: No   Sexual activity: Not on file  Other Topics Concern   Not on file  Social History Narrative   Lives at home with his wife.   Right-handed.   Caffeine use:  4 cups per day.   Social Determinants of Health   Financial Resource Strain: Not on file  Food Insecurity: Not on file  Transportation Needs: Not on file  Physical Activity: Not on file  Stress: Not on file  Social Connections: Not on file  Intimate Partner Violence: Not on file    PHYSICAL EXAM  Vitals:   01/26/22 0947  BP: (!) 155/82  Pulse: 73  Weight: 222 lb 8 oz (100.9 kg)  Height: '5\' 7"'$  (1.702 m)   Body mass index is 34.85 kg/m.  Generalized: Well developed, in no acute distress  Neurological examination  Mentation: Alert oriented to time, place, history taking. Follows all commands speech and language fluent Cranial nerve II-XII: Pupils were equal round reactive to light. Extraocular movements were full, visual field were full on confrontational test. Facial sensation and strength were normal.  Head turning and shoulder shrug  were normal and symmetric. Motor: The motor testing reveals 5 over 5 strength of all  4 extremities. Good symmetric motor tone is noted throughout.  Sensory: Sensory testing is intact to soft touch on all 4 extremities. No evidence of extinction is noted.  Coordination: Cerebellar testing reveals good finger-nose-finger and heel-to-shin bilaterally.  Gait and station: Gait is normal.  Reflexes: Deep tendon reflexes are symmetric and normal bilaterally.   DIAGNOSTIC DATA (LABS, IMAGING, TESTING) - I reviewed patient records, labs, notes, testing and imaging myself where available.  Lab Results  Component Value Date   WBC 9.5 12/13/2021   HGB 16.4 12/13/2021   HCT 47.3 12/13/2021   MCV 91 12/13/2021   PLT 242 07/22/2019      Component Value Date/Time   NA 142 12/13/2021 1148   K 3.6 12/13/2021 1148   CL 101 12/13/2021 1148   CO2 24 12/13/2021 1148   GLUCOSE 99 12/13/2021 1148   GLUCOSE 119 (H) 07/11/2016 0649   BUN 11 12/13/2021 1148   CREATININE 1.17 12/13/2021 1148   CALCIUM 10.1 12/13/2021 1148   PROT 7.7 12/13/2021 1148   ALBUMIN 5.0 (H) 12/13/2021 1148   AST 28 12/13/2021 1148   ALT 29 12/13/2021 1148   ALKPHOS 88 12/13/2021 1148   BILITOT 0.5 12/13/2021 1148   GFRNONAA 83 07/22/2019 0904   GFRAA 96 07/22/2019 0904   No results found for: "CHOL", "HDL", "LDLCALC", "LDLDIRECT", "TRIG", "CHOLHDL" No results found for: "HGBA1C" Lab Results  Component Value Date   VITAMINB12 616 12/13/2021   Lab Results  Component Value Date   TSH 1.020 12/13/2021    Butler Denmark, AGNP-C, DNP 01/26/2022, 10:18 AM [facility] [address] [place], [place] [address] [phone]

## 2022-01-26 ENCOUNTER — Ambulatory Visit: Payer: Medicare HMO | Admitting: Neurology

## 2022-01-26 ENCOUNTER — Encounter: Payer: Self-pay | Admitting: Neurology

## 2022-01-26 VITALS — BP 155/82 | HR 73 | Ht 67.0 in | Wt 222.5 lb

## 2022-01-26 DIAGNOSIS — R519 Headache, unspecified: Secondary | ICD-10-CM

## 2022-01-26 DIAGNOSIS — G40209 Localization-related (focal) (partial) symptomatic epilepsy and epileptic syndromes with complex partial seizures, not intractable, without status epilepticus: Secondary | ICD-10-CM | POA: Diagnosis not present

## 2022-01-26 MED ORDER — NORTRIPTYLINE HCL 10 MG PO CAPS: 20.0000 mg | ORAL_CAPSULE | Freq: Every day | ORAL | 1 refills | Status: DC

## 2022-01-26 MED ORDER — SUMATRIPTAN SUCCINATE 50 MG PO TABS: ORAL_TABLET | ORAL | 11 refills | Status: DC

## 2022-01-26 NOTE — Patient Instructions (Signed)
Continue Lamictal 150 mg twice a day Continue to monitor for any seizure activity We have recommended against driving Follow-up in 6 months

## 2022-02-03 ENCOUNTER — Other Ambulatory Visit: Payer: Self-pay | Admitting: Neurology

## 2022-02-07 NOTE — Progress Notes (Signed)
Chart reviewed, agree above plan ?

## 2022-02-24 DIAGNOSIS — N35013 Post-traumatic anterior urethral stricture: Secondary | ICD-10-CM | POA: Diagnosis not present

## 2022-03-01 ENCOUNTER — Other Ambulatory Visit: Payer: Self-pay | Admitting: Urology

## 2022-03-17 ENCOUNTER — Encounter (HOSPITAL_BASED_OUTPATIENT_CLINIC_OR_DEPARTMENT_OTHER): Payer: Self-pay | Admitting: Urology

## 2022-03-21 ENCOUNTER — Encounter (HOSPITAL_BASED_OUTPATIENT_CLINIC_OR_DEPARTMENT_OTHER): Payer: Self-pay | Admitting: Urology

## 2022-03-21 NOTE — Progress Notes (Signed)
Spoke w/ via phone for pre-op interview--- pt's wife, [name] (pt is poor historian due to cognitive issue's and has expressive aphasia Lab needs dos---- [employer], ekg              Lab results------ no COVID test -----patient states asymptomatic no test needed Arrive at ------- 0900 NPO after MN w/ exception sips of wate w/ meds Med rec completed Medications to take morning of surgery ----- lamictal , norvasc Diabetic medication ----- n/a Patient instructed no nail polish to be worn day of surgery Patient instructed to bring photo id and insurance card day of surgery Patient aware to have Driver (ride ) / caregiver    for 24 hours after surgery -- wife Patient Special Instructions -----  n/a Pre-Op special Istructions -----  pt's wife to pre-op due to pt being poor historian , he has cognitive issue / expressive aphasia  Patient verbalized understanding of instructions that were given at this phone interview. Patient denies shortness of breath, chest pain, fever, cough at this phone interview.

## 2022-03-21 NOTE — H&P (Signed)
Office Visit Report     02/24/2022   --------------------------------------------------------------------------------   Elmer Picker. Ruane  MRN: [id number]  DOB: 01-09-1966, 57 year old Male  SSN:    PRIMARY CARE:  Antony Contras, MD  PRIMARY CARE FAX:  [phone]  REFERRING:  Antony Contras, MD  PROVIDER:  Ellison Hughs, M.D.  LOCATION:  [facility] [phone]     --------------------------------------------------------------------------------   CC/HPI: CC: Elevated PSA   HPI: Mr. Vicary is a 57 year old male referred by Dr Moreen Fowler for evaluation of an elevated PSA value. Hx of squamous cell carcinoma of the penis, s/p local excision with Dr. Karsten Ro in 2018.   Last PSA: 8.2 (12/2021), 4.4 (04/2021). Negative prostate biopsy on 07/02/2021. No personal/family hx of prostate cancer   -Long hx of LUTS-- hx of brain tumor s/p resection in 2018  -From a urinary standpoint, the patient reports a weak force of stream and does not feel like he is adequately emptying his bladder. He also has episodes of urgency and urinary frequency, which is bothersome to him. He denies any prior history of UTIs, dysuria or hematuria.   01/06/2022:  Patient presents to clinic with wife at his side with complaints of gross hematuria. He continues on daily silodosin and sildenafil as needed. Today, he complains of 1 episode of painless gross hematuria with passage of clots 2 days ago. He is otherwise at baseline urinary function. He denies dysuria, suprapubic discomfort, fever/chills/sweats, flank pain, nausea/vomiting. He has had 1 prior episode associated with prostate biopsy. Of note, he is a former 25-year smoker, having quit 13 years ago.   02/24/2022: The patient is here today for cystoscopy to complete his hematuria evaluation. Above history noted. The patient reports stabilization of his urinary symptoms and denies interval episodes of hematuria.       ALLERGIES: Bee stings seasonal  allergies    MEDICATIONS: Hydrochlorothiazide 12.5 mg tablet  Rapaflo 4 mg capsule  Tamsulosin Hcl 0.4 mg capsule 1 capsule PO Daily  Amlodipine Besylate  Aspirin Ec 81 mg tablet, delayed release  Imitrex 25 mg tablet  Lamotrigine 150 mg tablet  Levofloxacin 750 mg tablet 1 tablet PO As Directed  Nortriptyline Hcl 10 mg capsule  Sildenafil Citrate 20 mg tablet  Telmisartan 80 mg tablet 1 tablet PO Daily     GU PSH: Exc H-f-nk-sp Mlg+marg 3.1-4 - 2018 Locm 300-'399Mg'$ /Ml Iodine,1Ml - 01/12/2022 Prostate Needle Biopsy - 07/02/2021       PSH Notes: Removal of meningioma 02/2016.  Colonoscopy, tubular adenoma 08/2020   NON-GU PSH: Brain surgery Colonoscopy Nose Surgery (Unspecified) Surgical Pathology, Gross And Microscopic Examination For Prostate Needle - 07/02/2021     GU PMH: Gross hematuria - 01/12/2022, - 01/06/2022 Elevated PSA - 07/02/2021, - 05/31/2021 BPH w/LUTS - 05/31/2021, While he does have some BPH by exam he does have moderate voiding symptoms., - 2018 Urinary Urgency - 05/31/2021 Encounter for Prostate Cancer screening - 2019, He has not had a DRE in the past year and it appears his last PSA was 2 years ago so a screening PSA will be obtained today. His prostate was noted to be benign to examination., - 2018 Disorder of male genital organs, unspecified, While these lesions could be condyloma I'm concerned about neoplasm and have discussed with the patient that either way these should be removed. I did not recommend cryotherapy as I would not have pathology so I have recommended excisional biopsy. I went over the procedure in detail including the  incision used, the risks and, patient's, the alternatives, the outpatient nature of the procedure as well as the probability of success and the anticipated postoperative course. He understands and has elected to proceed with this. - 2018 Nocturia, He has primarily obstructive voiding symptoms with nocturia 3-4 times, hesitancy,  intermittency but also has some frequency. We discussed a trial of alpha blockade therapy today and I am worried to initiate a trial of Rapaflo 8 mg and have him let me know how this has worked or him. - 2018      PMH Notes: History of stroke.   NON-GU PMH: Hypertension Seizure disorder Stroke/TIA    FAMILY HISTORY: Acute CVA (cerebrovascular accident) - Brother Colon Cancer - Mother Diabetes - Mother Hypertension - Father, Mother Kidney Failure - Sister   SOCIAL HISTORY: Marital Status: Married Preferred Language: English; Ethnicity: Not Hispanic Or Latino; Race: Black or African American Current Smoking Status: Patient does not smoke anymore.   Tobacco Use Assessment Completed: Used Tobacco in last 30 days? Has never drank.  Drinks 1 caffeinated drink per day.    REVIEW OF SYSTEMS:    GU Review Male:   Patient denies frequent urination, hard to postpone urination, burning/ pain with urination, get up at night to urinate, leakage of urine, stream starts and stops, trouble starting your stream, have to strain to urinate , erection problems, and penile pain.  Gastrointestinal (Upper):   Patient denies nausea, vomiting, and indigestion/ heartburn.  Gastrointestinal (Lower):   Patient denies diarrhea and constipation.  Constitutional:   Patient denies fever, night sweats, weight loss, and fatigue.  Skin:   Patient denies skin rash/ lesion and itching.  Eyes:   Patient denies blurred vision and double vision.  Ears/ Nose/ Throat:   Patient denies sore throat and sinus problems.  Hematologic/Lymphatic:   Patient denies swollen glands and easy bruising.  Cardiovascular:   Patient denies leg swelling and chest pains.  Respiratory:   Patient denies cough and shortness of breath.  Endocrine:   Patient denies excessive thirst.  Musculoskeletal:   Patient denies back pain and joint pain.  Neurological:   Patient denies headaches and dizziness.  Psychologic:   Patient denies depression and  anxiety.   VITAL SIGNS:      02/24/2022 11:28 AM  Weight 210 lb / 95.25 kg  BP 173/90 mmHg  Pulse 85 /min  Temperature 97.7 F / 36.5 C   Complexity of Data:   01/06/22 05/31/21 05/05/21 08/16/17 05/15/17 06/14/16  PSA  Total PSA 8.20 ng/mL 4.33 ng/mL 4.4 ng/ml 2.31 ng/mL 2.18 ng/mL 1.53 ng/dl  Free PSA 0.30 ng/mL       % Free PSA 4 % PSA         PROCEDURES:         Flexible Cystoscopy - 52000  Risks, benefits, and some of the potential complications of the procedure were discussed at length with the patient including infection, bleeding, voiding discomfort, urinary retention, fever, chills, sepsis, and others. All questions were answered. Informed consent was obtained. Antibiotic prophylaxis was given. Sterile technique and intraurethral analgesia were used.  Meatus:  Normal size. Normal location. Normal condition.  Urethra:  Moderate bulbar stricture.      The lower urinary tract was carefully examined. The procedure was well-tolerated and without complications. Antibiotic instructions were given. Instructions were given to call the office immediately for bloody urine, difficulty urinating, urinary retention, painful or frequent urination, fever, chills, nausea, vomiting or other illness. The patient stated that  he understood these instructions and would comply with them.         Urinalysis w/Scope Dipstick Dipstick Cont'd Micro  Color: Yellow Bilirubin: Neg mg/dL WBC/hpf: 0 - 5/hpf  Appearance: Clear Ketones: Neg mg/dL RBC/hpf: NS (Not Seen)  Specific Gravity: 1.025 Blood: Neg ery/uL Bacteria: Rare (0-9/hpf)  pH: 6.0 Protein: Neg mg/dL Cystals: NS (Not Seen)  Glucose: Neg mg/dL Urobilinogen: 0.2 mg/dL Casts: NS (Not Seen)    Nitrites: Neg Trichomonas: Not Present    Leukocyte Esterase: 2+ leu/uL Mucous: Not Present      Epithelial Cells: NS (Not Seen)      Yeast: NS (Not Seen)      Sperm: Not Present    ASSESSMENT:      ICD-10 Details  1 GU:   Anterior urethral stricture  - N35.013 Undiagnosed New Problem     PLAN:           Schedule Return Visit/Planned Activity: Next Available Appointment - Schedule Surgery          Document Letter(s):  Created for Patient: Clinical Summary         Notes:    -Flexible cystoscopy in the office today revealed a bulbar urethral stricture that I was unable to bypass with the flexible cystoscope. The area of stricture appeared to be less than 1 cm and fairly soft and composition.  -Plan for cystoscopy with Optilume urethral dilation in the coming weeks. Risk, benefits and alternatives discussed.

## 2022-03-22 ENCOUNTER — Other Ambulatory Visit: Payer: Self-pay

## 2022-03-22 ENCOUNTER — Ambulatory Visit (HOSPITAL_BASED_OUTPATIENT_CLINIC_OR_DEPARTMENT_OTHER): Payer: Medicare HMO | Admitting: Anesthesiology

## 2022-03-22 ENCOUNTER — Encounter (HOSPITAL_BASED_OUTPATIENT_CLINIC_OR_DEPARTMENT_OTHER): Payer: Self-pay | Admitting: Urology

## 2022-03-22 ENCOUNTER — Ambulatory Visit (HOSPITAL_BASED_OUTPATIENT_CLINIC_OR_DEPARTMENT_OTHER)
Admission: RE | Admit: 2022-03-22 | Discharge: 2022-03-22 | Disposition: A | Discharge status: Home / Self Care | Payer: Medicare HMO | Attending: Urology | Admitting: Urology

## 2022-03-22 ENCOUNTER — Encounter (HOSPITAL_BASED_OUTPATIENT_CLINIC_OR_DEPARTMENT_OTHER)
Admission: RE | Disposition: A | Discharge status: Home / Self Care | Payer: Self-pay | Source: Home / Self Care | Attending: Urology

## 2022-03-22 DIAGNOSIS — Z09 Encounter for follow-up examination after completed treatment for conditions other than malignant neoplasm: Secondary | ICD-10-CM | POA: Insufficient documentation

## 2022-03-22 DIAGNOSIS — N35912 Unspecified bulbous urethral stricture, male: Secondary | ICD-10-CM

## 2022-03-22 DIAGNOSIS — Z87891 Personal history of nicotine dependence: Secondary | ICD-10-CM | POA: Insufficient documentation

## 2022-03-22 DIAGNOSIS — I10 Essential (primary) hypertension: Secondary | ICD-10-CM | POA: Diagnosis not present

## 2022-03-22 DIAGNOSIS — N35919 Unspecified urethral stricture, male, unspecified site: Secondary | ICD-10-CM | POA: Diagnosis not present

## 2022-03-22 DIAGNOSIS — Z01818 Encounter for other preprocedural examination: Secondary | ICD-10-CM

## 2022-03-22 DIAGNOSIS — R310 Gross hematuria: Secondary | ICD-10-CM | POA: Insufficient documentation

## 2022-03-22 DIAGNOSIS — R972 Elevated prostate specific antigen [PSA]: Secondary | ICD-10-CM | POA: Insufficient documentation

## 2022-03-22 HISTORY — PX: CYSTOSCOPY WITH URETHRAL DILATATION: SHX5125

## 2022-03-22 HISTORY — DX: Localization-related (focal) (partial) symptomatic epilepsy and epileptic syndromes with complex partial seizures, not intractable, without status epilepticus: G40.209

## 2022-03-22 HISTORY — DX: Other complications of anesthesia, initial encounter: T88.59XA

## 2022-03-22 HISTORY — DX: Unspecified urethral stricture, male, unspecified site: N35.919

## 2022-03-22 LAB — POCT I-STAT, CHEM 8
BUN: 9 mg/dL (ref 6–20)
Calcium, Ion: 1.25 mmol/L (ref 1.15–1.40)
Chloride: 101 mmol/L (ref 98–111)
Creatinine, Ser: 0.9 mg/dL (ref 0.61–1.24)
Glucose, Bld: 120 mg/dL — ABNORMAL HIGH (ref 70–99)
HCT: 46 % (ref 39.0–52.0)
Hemoglobin: 15.6 g/dL (ref 13.0–17.0)
Potassium: 3.4 mmol/L — ABNORMAL LOW (ref 3.5–5.1)
Sodium: 141 mmol/L (ref 135–145)
TCO2: 24 mmol/L (ref 22–32)

## 2022-03-22 SURGERY — CYSTOSCOPY, WITH URETHRAL DILATION
Anesthesia: General | Site: Urethra

## 2022-03-22 MED ORDER — FENTANYL CITRATE (PF) 100 MCG/2ML IJ SOLN
INTRAMUSCULAR | Status: AC
  Filled 2022-03-22: qty 2

## 2022-03-22 MED ORDER — EPHEDRINE SULFATE (PRESSORS) 50 MG/ML IJ SOLN
INTRAMUSCULAR | Status: DC | PRN
  Administered 2022-03-22 (×5): 5 mg via INTRAVENOUS

## 2022-03-22 MED ORDER — PROPOFOL 10 MG/ML IV BOLUS
INTRAVENOUS | Status: DC | PRN
  Administered 2022-03-22: 200 mg via INTRAVENOUS

## 2022-03-22 MED ORDER — LACTATED RINGERS IV SOLN: INTRAVENOUS | Status: DC

## 2022-03-22 MED ORDER — ACETAMINOPHEN 500 MG PO TABS
1000.0000 mg | ORAL_TABLET | Freq: Once | ORAL | Status: AC
  Administered 2022-03-22: 1000 mg via ORAL

## 2022-03-22 MED ORDER — ONDANSETRON HCL 4 MG/2ML IJ SOLN
INTRAMUSCULAR | Status: DC | PRN
  Administered 2022-03-22: 4 mg via INTRAVENOUS

## 2022-03-22 MED ORDER — DEXAMETHASONE SODIUM PHOSPHATE 10 MG/ML IJ SOLN
INTRAMUSCULAR | Status: DC | PRN
  Administered 2022-03-22: 5 mg via INTRAVENOUS

## 2022-03-22 MED ORDER — CEFAZOLIN SODIUM-DEXTROSE 2-4 GM/100ML-% IV SOLN
2.0000 g | INTRAVENOUS | Status: AC
  Administered 2022-03-22: 2 g via INTRAVENOUS

## 2022-03-22 MED ORDER — FENTANYL CITRATE (PF) 100 MCG/2ML IJ SOLN
INTRAMUSCULAR | Status: DC | PRN
  Administered 2022-03-22: 50 ug via INTRAVENOUS
  Administered 2022-03-22: 25 ug via INTRAVENOUS
  Administered 2022-03-22: 50 ug via INTRAVENOUS

## 2022-03-22 MED ORDER — BACLOFEN 10 MG PO TABS: 5.0000 mg | ORAL_TABLET | Freq: Three times a day (TID) | ORAL | 1 refills | Status: AC | PRN

## 2022-03-22 MED ORDER — CEFAZOLIN SODIUM-DEXTROSE 2-4 GM/100ML-% IV SOLN
INTRAVENOUS | Status: AC
  Filled 2022-03-22: qty 100

## 2022-03-22 MED ORDER — PHENYLEPHRINE 80 MCG/ML (10ML) SYRINGE FOR IV PUSH (FOR BLOOD PRESSURE SUPPORT)
PREFILLED_SYRINGE | INTRAVENOUS | Status: DC | PRN
  Administered 2022-03-22 (×4): 80 ug via INTRAVENOUS

## 2022-03-22 MED ORDER — ACETAMINOPHEN 500 MG PO TABS
ORAL_TABLET | ORAL | Status: AC
  Filled 2022-03-22: qty 2

## 2022-03-22 MED ORDER — FENTANYL CITRATE (PF) 100 MCG/2ML IJ SOLN: 25.0000 ug | INTRAMUSCULAR | Status: DC | PRN

## 2022-03-22 MED ORDER — STERILE WATER FOR IRRIGATION IR SOLN
Status: DC | PRN
  Administered 2022-03-22: 500 mL

## 2022-03-22 MED ORDER — MIDAZOLAM HCL 2 MG/2ML IJ SOLN
INTRAMUSCULAR | Status: AC
  Filled 2022-03-22: qty 2

## 2022-03-22 MED ORDER — LIDOCAINE 2% (20 MG/ML) 5 ML SYRINGE
INTRAMUSCULAR | Status: DC | PRN
  Administered 2022-03-22: 60 mg via INTRAVENOUS

## 2022-03-22 MED ORDER — SODIUM CHLORIDE 0.9 % IR SOLN
Status: DC | PRN
  Administered 2022-03-22: 3000 mL

## 2022-03-22 SURGICAL SUPPLY — 26 items
BAG DRAIN URO-CYSTO SKYTR STRL (DRAIN) ×1 IMPLANT
BAG DRN RND TRDRP ANRFLXCHMBR (UROLOGICAL SUPPLIES) ×1
BAG DRN UROCATH (DRAIN) ×1
BAG URINE DRAIN 2000ML AR STRL (UROLOGICAL SUPPLIES) ×1 IMPLANT
BALLN OPTILUME DCB 30X3X75 (BALLOONS) ×1
BALLOON OPTILUME DCB 30X3X75 (BALLOONS) IMPLANT
CATH FOLEY 2WAY SLVR  5CC 14FR (CATHETERS) ×1
CATH FOLEY 2WAY SLVR 5CC 14FR (CATHETERS) IMPLANT
CATH ROBINSON RED A/P 14FR (CATHETERS) IMPLANT
CATH SET URETHRAL DILATOR (CATHETERS) IMPLANT
CLOTH BEACON ORANGE TIMEOUT ST (SAFETY) ×1 IMPLANT
ELECT REM PT RETURN 9FT ADLT (ELECTROSURGICAL) ×1
ELECTRODE REM PT RTRN 9FT ADLT (ELECTROSURGICAL) ×1 IMPLANT
GLOVE BIO SURGEON STRL SZ 6.5 (GLOVE) IMPLANT
GLOVE BIO SURGEON STRL SZ7.5 (GLOVE) ×1 IMPLANT
GLOVE SURG SS PI 6.5 STRL IVOR (GLOVE) IMPLANT
GOWN STRL REUS W/ TWL LRG LVL3 (GOWN DISPOSABLE) IMPLANT
GOWN STRL REUS W/TWL LRG LVL3 (GOWN DISPOSABLE) ×4 IMPLANT
GOWN STRL REUS W/TWL XL LVL3 (GOWN DISPOSABLE) IMPLANT
HOLDER FOLEY CATH W/STRAP (MISCELLANEOUS) ×1 IMPLANT
IV NS IRRIG 3000ML ARTHROMATIC (IV SOLUTION) IMPLANT
KIT TURNOVER CYSTO (KITS) ×1 IMPLANT
MANIFOLD NEPTUNE II (INSTRUMENTS) ×1 IMPLANT
PACK CYSTO (CUSTOM PROCEDURE TRAY) ×1 IMPLANT
SLEEVE SCD COMPRESS KNEE MED (STOCKING) ×1 IMPLANT
TUBE CONNECTING 12X1/4 (SUCTIONS) ×1 IMPLANT

## 2022-03-22 NOTE — Op Note (Signed)
Operative Note  Preoperative diagnosis:  1.  1 cm bulbar urethral stricture 2.  History of gross hematuria  Postoperative diagnosis: 1.  1.5 cm bulbar urethral stricture  Procedure(s): 1.  Cystoscopy with Optilume urethral stricture dilation  Surgeon: Ellison Hughs, MD  Assistants:  None  Anesthesia:  General  Complications:  None  EBL: Less than 5 mL  Specimens: 1.  None  Drains/Catheters: 1.  14 French Foley catheter with 10 mL of sterile water in the balloon  Intraoperative findings:   1.5 cm bulbar urethral stricture No intravesical abnormalities  Indication:  Kyle Chan is a 57 y.o. male with bulbar urethral stricture causing progressively worsening lower urinary tract symptoms.  He has been consented for the above procedures, voices understanding and wishes to proceed.  Description of procedure:  After informed consent was obtained, the patient was brought to the operating room and general LMA anesthesia was administered. The patient was then placed in the dorsolithotomy position and prepped and draped in the usual sterile fashion. A timeout was performed. A 21 French rigid cystoscope was then inserted into the urethral meatus and advanced until his bulbar urethral stricture was identified.  A Glidewire was then advanced through the aperture of the urethral stricture and into the bladder.  Amplatz dilators were then used to dilate the stricture starting at 14 French and progressing up to 22 [place], and 2 [place] increments.  The rigid cystoscope was then advanced into the bladder and no intravesical abnormalities were identified.  The area of stricture was measured to be approximately 1.5 cm.  The Optilume balloon dilator was then advanced over the wire and into position within the area of stricture.  The balloon was then dilated up to 10 atm of pressure and left in place for 5 minutes.  After 5 minutes, the rigid cystoscope was removed.  The balloon was deflated  and removed over the wire.  A 14 French Foley catheter was then advanced over the Glidewire and into the bladder.  The Foley catheter was then placed to gravity drainage.  He tolerated the procedure well and was transferred to the postanesthesia in stable condition.  Plan: Follow-up in 5 days for an office catheter removal

## 2022-03-22 NOTE — Anesthesia Preprocedure Evaluation (Addendum)
Anesthesia Evaluation  Patient identified by MRN, date of birth, ID band Patient awake    Reviewed: Allergy & Precautions, NPO status , Patient's Chart, lab work & pertinent test results  Airway Mallampati: II  TM Distance: >3 FB Neck ROM: Full    Dental no notable dental hx.    Pulmonary former smoker   Pulmonary exam normal breath sounds clear to auscultation       Cardiovascular hypertension, Pt. on medications Normal cardiovascular exam Rhythm:Regular Rate:Normal     Neuro/Psych  Headaches, Seizures -, Well Controlled,  Meningioma s/p resection now with expressive aphasia  negative psych ROS   GI/Hepatic Neg liver ROS,GERD  ,,  Endo/Other  negative endocrine ROS    Renal/GU negative Renal ROS  negative genitourinary   Musculoskeletal negative musculoskeletal ROS (+)    Abdominal   Peds  Hematology negative hematology ROS (+)   Anesthesia Other Findings   Reproductive/Obstetrics                             Anesthesia Physical Anesthesia Plan  ASA: 3  Anesthesia Plan: General   Post-op Pain Management: Tylenol PO (pre-op)*   Induction: Intravenous  PONV Risk Score and Plan: 2 and Ondansetron and Dexamethasone  Airway Management Planned: LMA  Additional Equipment:   Intra-op Plan:   Post-operative Plan: Extubation in OR  Informed Consent: I have reviewed the patients History and Physical, chart, labs and discussed the procedure including the risks, benefits and alternatives for the proposed anesthesia with the patient or authorized representative who has indicated his/her understanding and acceptance.     Dental advisory given  Plan Discussed with: CRNA  Anesthesia Plan Comments:        Anesthesia Quick Evaluation

## 2022-03-22 NOTE — Discharge Instructions (Addendum)
  Post Anesthesia Home Care Instructions  Activity: Get plenty of rest for the remainder of the day. A responsible individual must stay with you for 24 hours following the procedure.  For the next 24 hours, DO NOT: -Drive a car -Paediatric nurse -Drink alcoholic beverages -Take any medication unless instructed by your physician -Make any legal decisions or sign important papers.  Meals: Start with liquid foods such as gelatin or soup. Progress to regular foods as tolerated. Avoid greasy, spicy, heavy foods. If nausea and/or vomiting occur, drink only clear liquids until the nausea and/or vomiting subsides. Call your physician if vomiting continues.  Special Instructions/Symptoms: Your throat may feel dry or sore from the anesthesia or the breathing tube placed in your throat during surgery. If this causes discomfort, gargle with warm salt water. The discomfort should disappear within 24 hours.  No acetaminophen/Tylenol until after 3:45 pm today if needed.

## 2022-03-22 NOTE — Transfer of Care (Signed)
Immediate Anesthesia Transfer of Care Note  Patient: Kyle Chan  Procedure(s) Performed: CYSTOSCOPY WITH OPTILUME URETHRAL DILATATION (Urethra)  Patient Location: PACU  Anesthesia Type:General  Level of Consciousness: drowsy and patient cooperative  Airway & Oxygen Therapy: Patient Spontanous Breathing and Patient connected to face mask oxygen  Post-op Assessment: Report given to RN and Post -op Vital signs reviewed and stable  Post vital signs: Reviewed and stable  Last Vitals:  Vitals Value Taken Time  BP 141/77 03/22/22 1215  Temp 36.7 C 03/22/22 1152  Pulse 64 03/22/22 1216  Resp 15 03/22/22 1216  SpO2 91 % 03/22/22 1216  Vitals shown include unvalidated device data.  Last Pain:  Vitals:   03/22/22 1152  TempSrc:   PainSc: 0-No pain      Patients Stated Pain Goal: 8 ([id number])  Complications: No notable events documented.

## 2022-03-22 NOTE — Anesthesia Postprocedure Evaluation (Signed)
Anesthesia Post Note  Patient: Kyle Chan  Procedure(s) Performed: CYSTOSCOPY WITH OPTILUME URETHRAL DILATATION (Urethra)     Patient location during evaluation: PACU Anesthesia Type: General Level of consciousness: awake and alert Pain management: pain level controlled Vital Signs Assessment: post-procedure vital signs reviewed and stable Respiratory status: spontaneous breathing, nonlabored ventilation, respiratory function stable and patient connected to nasal cannula oxygen Cardiovascular status: blood pressure returned to baseline and stable Postop Assessment: no apparent nausea or vomiting Anesthetic complications: no  No notable events documented.  Last Vitals:  Vitals:   03/22/22 1215 03/22/22 1230  BP: (!) 141/77 (!) 140/76  Pulse: 62 66  Resp: 16 14  Temp:  36.6 C  SpO2: 92% 93%    Last Pain:  Vitals:   03/22/22 1230  TempSrc:   PainSc: 0-No pain                 Natasia Sanko L Reiley Bertagnolli

## 2022-03-22 NOTE — Anesthesia Procedure Notes (Signed)
Procedure Name: LMA Insertion Date/Time: 03/22/2022 11:11 AM  Performed by: Clearnce Sorrel, CRNAPre-anesthesia Checklist: Patient identified, Emergency Drugs available, Suction available and Patient being monitored Patient Re-evaluated:Patient Re-evaluated prior to induction Oxygen Delivery Method: Circle System Utilized Preoxygenation: Pre-oxygenation with 100% oxygen Induction Type: IV induction Ventilation: Mask ventilation without difficulty LMA: LMA inserted LMA Size: 5.0 Number of attempts: 1 Airway Equipment and Method: Bite block Placement Confirmation: positive ETCO2 Tube secured with: Tape Dental Injury: Teeth and Oropharynx as per pre-operative assessment

## 2022-03-23 ENCOUNTER — Encounter (HOSPITAL_BASED_OUTPATIENT_CLINIC_OR_DEPARTMENT_OTHER): Payer: Self-pay | Admitting: Urology

## 2022-03-29 DIAGNOSIS — N35013 Post-traumatic anterior urethral stricture: Secondary | ICD-10-CM | POA: Diagnosis not present

## 2022-04-05 DIAGNOSIS — R310 Gross hematuria: Secondary | ICD-10-CM | POA: Diagnosis not present

## 2022-04-05 DIAGNOSIS — N35011 Post-traumatic bulbous urethral stricture: Secondary | ICD-10-CM | POA: Diagnosis not present

## 2022-05-02 DIAGNOSIS — Z86018 Personal history of other benign neoplasm: Secondary | ICD-10-CM | POA: Diagnosis not present

## 2022-05-02 DIAGNOSIS — I69320 Aphasia following cerebral infarction: Secondary | ICD-10-CM | POA: Diagnosis not present

## 2022-05-02 DIAGNOSIS — N400 Enlarged prostate without lower urinary tract symptoms: Secondary | ICD-10-CM | POA: Diagnosis not present

## 2022-05-02 DIAGNOSIS — G4700 Insomnia, unspecified: Secondary | ICD-10-CM | POA: Diagnosis not present

## 2022-05-02 DIAGNOSIS — G40209 Localization-related (focal) (partial) symptomatic epilepsy and epileptic syndromes with complex partial seizures, not intractable, without status epilepticus: Secondary | ICD-10-CM | POA: Diagnosis not present

## 2022-05-02 DIAGNOSIS — I10 Essential (primary) hypertension: Secondary | ICD-10-CM | POA: Diagnosis not present

## 2022-05-02 DIAGNOSIS — G43909 Migraine, unspecified, not intractable, without status migrainosus: Secondary | ICD-10-CM | POA: Diagnosis not present

## 2022-05-02 DIAGNOSIS — J309 Allergic rhinitis, unspecified: Secondary | ICD-10-CM | POA: Diagnosis not present

## 2022-05-02 DIAGNOSIS — N529 Male erectile dysfunction, unspecified: Secondary | ICD-10-CM | POA: Diagnosis not present

## 2022-06-02 ENCOUNTER — Ambulatory Visit: Payer: Medicare HMO | Admitting: Neurology

## 2022-06-02 ENCOUNTER — Encounter: Payer: Self-pay | Admitting: Neurology

## 2022-06-02 VITALS — BP 140/80 | Ht 67.0 in | Wt 214.0 lb

## 2022-06-02 DIAGNOSIS — G40209 Localization-related (focal) (partial) symptomatic epilepsy and epileptic syndromes with complex partial seizures, not intractable, without status epilepticus: Secondary | ICD-10-CM | POA: Diagnosis not present

## 2022-06-02 DIAGNOSIS — Z9889 Other specified postprocedural states: Secondary | ICD-10-CM

## 2022-06-02 NOTE — Progress Notes (Signed)
ASSESSMENT AND PLAN 57 y.o. year old male   1 Status post large sided left meningioma resection in February 2018 2.  High risk for partial seizure, history of recurrent spells of transient memory lapse 3.  Worsening memory loss 4.  Migraine headaches   Doing weill on Lamictal 150 mg twice a day  Also on Nortriptyline 10mg  2 tabs qhs as migraine prevention  Imitrex 50 mg prn for abortive treatment   DIAGNOSTIC DATA (LABS, IMAGING, TESTING) - I reviewed patient records, labs, notes, testing and imaging myself where available.  HISTORY  Kyle Chan is a 57 year old male, accompanied by his wife Kyle Chan, seen in refer by his primary care physician Dr. Tally Joe, initial evaluation was on June 26, 2017.   He had left frontotemporal craniotomy for resection of meningioma on March 02, 2016.  He presented with sudden onset of confusion prior to the surgery,   MRI of the brain with without contrast on March 02, 2016 prior to surgery showed large left convexity meningioma with underlying mass-effect on the left frontal and temporal lobes, adjacent edema, 6.1 x 5.9 x 4.2 cm, with 10 mm rightward midline shift at the level of the foraminal of [place].   Post surgically, he was treated with Keppra 500 mg twice a day for few months, he had no recurrent seizure, Keppra was stopped.   He used to work as a Hydrographic surveyor, was not able to go back to work since his left craniotomy, he now complains of memory loss, frequent headaches, confusion, difficulty sleeping, he also has language difficulty, this including comprehensive difficulty, and expressive aphasia, he also describes episode of suddenly lapse of memory while watching TV, could not follow up that episode, there was 3 episode of sudden onset of bowel incontinence, with mild confusion,   He now complains of daily moderate headaches, bilateral frontal, oftentimes it would be exacerbated to a more severe 7 out of 10 headache  lasting for few minutes, multiple episode during the day, he also complains of intermittent right leg arm numbness, weakness   Positive surgical MRI of the brain with without contrast in May 2019 showed no residual meningioma, encephalomalacia involving left lateral temporal lobe and left frontal orbiculum,   UPDATE September 11 2017: He is very happy with lamotrigine 100 mg twice a day, no longer has headaches, no clinical seizure noted, And there was no significant side effect noted.   UPDATE Mar 14 2018: He no longer has spells of sudden onset memory loss since lamotrigine 100mg  bid, Reported history of frequent headaches since his head injury concussion few years ago, now complains of increased headache, frequent pressure headaches, also multiple episode of sharp traveling shooting pain, he has been taking almost daily Excedrin Migraine with some help, during intense headaches he does has noise light sensitivity, movement made it worse,   He also complains of difficulty sleeping sometimes.   UPDATE Jan 02 2020: He is overall doing very well, tolerating lamotrigine 100 mg twice a day, no longer have seizure, his migraine is under excellent control, Imitrex 25 mg as needed works well most of the time, but still take a couple hours for his headache to go away   Laboratory evaluation in June 2021: Lamotrigine level 4.0, normal CMP, glucose of 120, CBC, hemoglobin 15.7   UPDATE Dec 13 2021: He is with his wife at visit, taking lamtorigine 100mg  bid, occasionally forget, has to go back to take medications.   His  wife is very concerned about his increased memory loss, frequent confusion spells, she gives example of November 17, 2021, she called him to check on him as a routine, patient was at gas station, confused, does not know how to pump the gas, left the gas station.   He also often get confused about directions while driving, wife said sometimes he forgot his password, do not know how to log  into his account which he manages frequently  UPDATE Jun 02 2022: He is accompanied by his wife at today's visit, doing very well taking lamotrigine 100 mg 1 and half tablets twice a day, no longer have visible seizure spells, still has memory loss, occasionally word finding difficulties, is in good mood,  Personally reviewed MRI of the brain with without contrast December 2023, remote left craniotomy with associated left temporal, frontal gliosis/encephalomalacia, with resultant left lateral ventricle enlargement, no recurrent tumor  Laboratory evaluations showed normal negative HIV, RPR, B12, TSH, CBC, lamotrigine level 4.9, CMP potassium of 3.4,   REVIEW OF SYSTEMS: Out of a complete 14 system review of symptoms, the patient complains only of the following symptoms, and all other reviewed systems are negative.  See HPI  PHYSICAL EXAM  Vitals:   06/02/22 1134  BP: (!) 140/80  Weight: 214 lb (97.1 kg)  Height: 5\' 7"  (1.702 m)   Body mass index is 33.52 kg/m.   PHYSICAL EXAMNIATION:  Gen: NAD, conversant, well nourised, well groomed                     Cardiovascular: Regular rate rhythm, no peripheral edema, warm, nontender. Eyes: Conjunctivae clear without exudates or hemorrhage Neck: Supple, no carotid bruits. Pulmonary: Clear to auscultation bilaterally   NEUROLOGICAL EXAM:  MENTAL STATUS: Speech/cognition: Awake, alert oriented to history taking and casual conversation  CRANIAL NERVES: CN II: Visual fields are full to confrontation.  Pupils are round equal and briskly reactive to light. CN III, IV, VI: extraocular movement are normal. No ptosis. CN V: Facial sensation is intact to pinprick in all 3 divisions bilaterally. Corneal responses are intact.  CN VII: Face is symmetric with normal eye closure and smile. CN VIII: Hearing is normal to casual conversation CN IX, X: Palate elevates symmetrically. Phonation is normal. CN XI: Head turning and shoulder shrug are  intact CN XII: Tongue is midline with normal movements and no atrophy.  MOTOR: There is no pronator drift of out-stretched arms. Muscle bulk and tone are normal. Muscle strength is normal.  REFLEXES: Reflexes are 2+ and symmetric at the biceps, triceps, knees, and ankles. Plantar responses are flexor.  SENSORY: Intact to light touch, pinprick, positional and vibratory sensation are intact in fingers and toes.  COORDINATION: Rapid alternating movements and fine finger movements are intact. There is no dysmetria on finger-to-nose and heel-knee-shin.    GAIT/STANCE: Posture is normal. Gait is steady with normal steps, base, arm swing, and turning. Heel and toe walking are normal. Tandem gait is normal.  Romberg is absent.  ALLERGIES: Allergies  Allergen Reactions   Bee Venom Swelling    HOME MEDICATIONS: Outpatient Medications Prior to Visit  Medication Sig Dispense Refill   amLODipine (NORVASC) 10 MG tablet Take 10 mg by mouth every morning.      baclofen (LIORESAL) 10 MG tablet Take 0.5 tablets (5 mg total) by mouth 3 (three) times daily as needed for muscle spasms. 15 tablet 1   Fiber 500 MG CAPS Take 2 capsules by mouth 2 (two)  times daily.     lamoTRIgine (LAMICTAL) 100 MG tablet Take 1.5 tablets (150 mg total) by mouth 2 (two) times daily. 270 tablet 3   Multiple Vitamin (MULTIVITAMIN) capsule Take 1 capsule by mouth daily.     nortriptyline (PAMELOR) 10 MG capsule Take 2 capsules (20 mg total) by mouth at bedtime. 180 capsule 1   SUMAtriptan (IMITREX) 50 MG tablet TAKE 1 TABLET BY MOUTH AT ONSET OF MIGRAINE. MAY REPEAT IN 2 HOURS IF NEEDED. MAXIMUM DAILY DOSE OF 2 TABLETS**30 DAYS SUPPLY** (Patient taking differently: Take 50 mg by mouth every 2 (two) hours as needed for migraine. TAKE 1 TABLET BY MOUTH AT ONSET OF MIGRAINE. MAY REPEAT IN 2 HOURS IF NEEDED. MAXIMUM DAILY DOSE OF 2 TABLETS**30 DAYS SUPPLY**) 9 tablet 11   telmisartan (MICARDIS) 80 MG tablet Take 80 mg by mouth  daily.  1   No facility-administered medications prior to visit.    PAST MEDICAL HISTORY: Past Medical History:  Diagnosis Date   Back pain    Benign non-nodular prostatic hyperplasia with lower urinary tract symptoms    Complication of anesthesia    pt complication with airway intubation due to irritation with tube with diaphram with have hiccups, need to be prescribed baclfen this helps to stop them   Expressive aphasia 02/2016   confusion/cognitive due to left frontal meningioma s/p  resection 03-03-2016   History of cerebral meningioma 02/2016   S/P  RESECTION 03-03-2016---  CAUSED EXPRESSIVE APHASIA  AND THIS CONTINUES   History of diverticulitis    History of gastroesophageal reflux (GERD)    History of penile cancer    SSCinsitu   Hypertension    Memory loss    Partial symptomatic epilepsy with complex partial seizures, not intractable, without status epilepticus (HCC)    neuruologist--- dr Terrace Arabia;  per pt wife no seizure since 2018   Poor historian    due to expressive aphasia/  congitive impairment   Urethral stricture    Wears dentures    upper   Wears glasses     PAST SURGICAL HISTORY: Past Surgical History:  Procedure Laterality Date   CRANIOTOMY Left 03/03/2016   Procedure: LEFT CRANIOTOMY FOR BRAIN TUMOR;  Surgeon: Tia Alert, MD;  Location: [facility] OR;  Service: Neurosurgery;  Laterality: Left;   CRANIOTOMY Left 03/21/2016   Procedure: IRRIGATION AND DEBRIDMENT OF CRANIOTOMY  AND EXPLORATION OF WOUND;  Surgeon: Tia Alert, MD;  Location[facility] OR;  Service: Neurosurgery;  Laterality: Left;  IRRIGATION AND DEBRIDMENT OF CRANIOTOMY  AND EXPLORATION OF WOUND   CYSTOSCOPY WITH URETHRAL DILATATION N/A 03/22/2022   Procedure: CYSTOSCOPY WITH OPTILUME URETHRAL DILATATION;  Surgeon: Rene Paci, MD;  Location: [facility];  Service: Urology;  Laterality: N/A;   KNEE SURGERY Left    NASAL RECONSTRUCTION WITH SEPTAL REPAIR N/A 11/18/2012    Procedure: NASAL RECONSTRUCTION WITH SEPTAL REPAIR;  Surgeon: Christia Reading, MD;  Location: [facility] OR;  Service: ENT;  Laterality: N/A;   PENILE BIOPSY N/A 07/11/2016   Procedure: EXCISION OF PENILE LESION;  Surgeon: Ihor Gully, MD;  Location: [facility];  Service: Urology;  Laterality: N/A;    FAMILY HISTORY: Family History  Problem Relation Age of Onset   Hypertension Mother    Abdominal Wall Hernia Mother    Diabetes Mother    Colon cancer Mother    Hypertension Father     SOCIAL HISTORY: Social History   Socioeconomic History   Marital status: Married  Spouse name: Not on file   Number of children: 0   Years of education: 2   Highest education level: High school graduate  Occupational History   Occupation: currently trying to get disablility  Tobacco Use   Smoking status: Former    Years: 27    Types: Cigarettes    Quit date: 07/04/2008    Years since quitting: 13.9   Smokeless tobacco: Never  Vaping Use   Vaping Use: Never used  Substance and Sexual Activity   Alcohol use: Yes    Comment: social - one drink every 2-3 months   Drug use: No   Sexual activity: Not on file  Other Topics Concern   Not on file  Social History Narrative   Lives at home with his wife.   Right-handed.   Caffeine use:  4 cups per day.   Social Determinants of Health   Financial Resource Strain: Not on file  Food Insecurity: Not on file  Transportation Needs: Not on file  Physical Activity: Not on file  Stress: Not on file  Social Connections: Not on file  Intimate Partner Violence: Not on file      Levert Feinstein, M.D. Ph.D.  [facility] [address] [place], [place] [address] Phone: [phone] Fax:      [phone]

## 2022-06-23 DIAGNOSIS — Z0000 Encounter for general adult medical examination without abnormal findings: Secondary | ICD-10-CM | POA: Diagnosis not present

## 2022-06-23 DIAGNOSIS — I10 Essential (primary) hypertension: Secondary | ICD-10-CM | POA: Diagnosis not present

## 2022-06-23 DIAGNOSIS — R7309 Other abnormal glucose: Secondary | ICD-10-CM | POA: Diagnosis not present

## 2022-06-23 DIAGNOSIS — G40209 Localization-related (focal) (partial) symptomatic epilepsy and epileptic syndromes with complex partial seizures, not intractable, without status epilepticus: Secondary | ICD-10-CM | POA: Diagnosis not present

## 2022-06-23 DIAGNOSIS — G4700 Insomnia, unspecified: Secondary | ICD-10-CM | POA: Diagnosis not present

## 2022-06-23 DIAGNOSIS — G43909 Migraine, unspecified, not intractable, without status migrainosus: Secondary | ICD-10-CM | POA: Diagnosis not present

## 2022-06-23 DIAGNOSIS — Z1331 Encounter for screening for depression: Secondary | ICD-10-CM | POA: Diagnosis not present

## 2022-06-23 DIAGNOSIS — Z86018 Personal history of other benign neoplasm: Secondary | ICD-10-CM | POA: Diagnosis not present

## 2022-06-23 DIAGNOSIS — N529 Male erectile dysfunction, unspecified: Secondary | ICD-10-CM | POA: Diagnosis not present

## 2022-06-23 DIAGNOSIS — Z1159 Encounter for screening for other viral diseases: Secondary | ICD-10-CM | POA: Diagnosis not present

## 2022-06-23 DIAGNOSIS — R972 Elevated prostate specific antigen [PSA]: Secondary | ICD-10-CM | POA: Diagnosis not present

## 2022-06-23 DIAGNOSIS — I69320 Aphasia following cerebral infarction: Secondary | ICD-10-CM | POA: Diagnosis not present

## 2022-07-06 DIAGNOSIS — B354 Tinea corporis: Secondary | ICD-10-CM | POA: Diagnosis not present

## 2022-07-06 DIAGNOSIS — R972 Elevated prostate specific antigen [PSA]: Secondary | ICD-10-CM | POA: Diagnosis not present

## 2022-07-11 ENCOUNTER — Telehealth: Payer: Self-pay | Admitting: Neurology

## 2022-07-11 MED ORDER — NORTRIPTYLINE HCL 10 MG PO CAPS: 30.0000 mg | ORAL_CAPSULE | Freq: Every day | ORAL | 1 refills | Status: DC

## 2022-07-11 NOTE — Telephone Encounter (Signed)
Returne call pt wife and she stated the imitrex 9 pills q30days isn't lasting. She stated he has 2 left for this month and we aren't even half way thru. Pt wife stated that he has migraine about every other day but imitrex is the only thing that helps. I told them that I would route to provider and see what they recommend because typically sumatriptan is a rescue and not used as a daily preventative.

## 2022-07-11 NOTE — Telephone Encounter (Signed)
Called and relayed msg to pt wife and they were agreeable to nortriptyline increase and were advised regarding overuse of imitrex. She voiced gratitude and understanding

## 2022-07-11 NOTE — Telephone Encounter (Signed)
Wife is asking for a call to discuss increasing the amount of pills pt is given, she states the current amount is not enough.  Please call wife to discuss,

## 2022-07-11 NOTE — Telephone Encounter (Signed)
He can increase the nortriptyline to 30 mg at bedtime to help with migraine prevention. Only treat severe headaches, at risk for rebound headaches with overuse of Imitrex. Unfortunately insurance limits the amount of tablets most patients can get, so the quantity may be out of our control.   Meds ordered this encounter  Medications   nortriptyline (PAMELOR) 10 MG capsule    Sig: Take 3 capsules (30 mg total) by mouth at bedtime.    Dispense:  270 capsule    Refill:  1

## 2022-08-03 ENCOUNTER — Encounter: Payer: Self-pay | Admitting: Neurology

## 2022-08-03 ENCOUNTER — Ambulatory Visit: Payer: Medicare HMO | Admitting: Neurology

## 2022-08-03 VITALS — BP 147/84 | HR 76 | Ht 67.0 in | Wt 217.2 lb

## 2022-08-03 DIAGNOSIS — R519 Headache, unspecified: Secondary | ICD-10-CM | POA: Diagnosis not present

## 2022-08-03 DIAGNOSIS — G40209 Localization-related (focal) (partial) symptomatic epilepsy and epileptic syndromes with complex partial seizures, not intractable, without status epilepticus: Secondary | ICD-10-CM

## 2022-08-03 MED ORDER — NORTRIPTYLINE HCL 25 MG PO CAPS: 25.0000 mg | ORAL_CAPSULE | Freq: Every day | ORAL | 3 refills | Status: DC

## 2022-08-03 MED ORDER — SUMATRIPTAN SUCCINATE 50 MG PO TABS: ORAL_TABLET | ORAL | 11 refills | Status: DC

## 2022-08-03 MED ORDER — LAMOTRIGINE 100 MG PO TABS: 150.0000 mg | ORAL_TABLET | Freq: Two times a day (BID) | ORAL | 3 refills | Status: DC

## 2022-08-03 NOTE — Progress Notes (Signed)
Patient: Kyle Chan Date of Birth: 1965/06/02  Reason for Visit: Follow up History from: Patient, wife  Primary Neurologist: Terrace Arabia  ASSESSMENT AND PLAN 57 y.o. year old male   1.  Status post large sided left meningioma resection February 2018 2.  High risk for partial seizure, history of recurrent spells of transient memory lapse 3.  Worsening memory loss 4.  Migraine headaches -Overall doing well -Decided to change nortriptyline to 25 mg at bedtime for migraine prevention (currently on 20 mg at bedtime) -Continue Imitrex as needed for acute headache -Continue Lamictal 150 mg twice daily for seizure prevention -Labs 12/13/21 CBC, CMP, TSH, B12, RPR were unremarkable.  Lamictal level 4.9.  EEG was normal.  MRI of the brain with and without contrast showed encephalomalacia to the left temporal and frontal area.  High risk for recurrent seizure  -Keep follow-up appointment in May 2025  Meds ordered this encounter  Medications   SUMAtriptan (IMITREX) 50 MG tablet    Sig: TAKE 1 TABLET BY MOUTH AT ONSET OF MIGRAINE. MAY REPEAT IN 2 HOURS IF NEEDED. MAXIMUM DAILY DOSE OF 2 TABLETS**30 DAYS SUPPLY**    Dispense:  9 tablet    Refill:  11   nortriptyline (PAMELOR) 25 MG capsule    Sig: Take 1 capsule (25 mg total) by mouth at bedtime.    Dispense:  90 capsule    Refill:  3   lamoTRIgine (LAMICTAL) 100 MG tablet    Sig: Take 1.5 tablets (150 mg total) by mouth 2 (two) times daily.    Dispense:  270 tablet    Refill:  3    HISTORY  Kyle Chan is a 57 year old male, accompanied by his wife Kyle Chan, seen in refer by his primary care physician Dr. Tally Joe, initial evaluation was on June 26, 2017.   He had left frontotemporal craniotomy for resection of meningioma on March 02, 2016.  He presented with sudden onset of confusion prior to the surgery,   MRI of the brain with without contrast on March 02, 2016 prior to surgery showed large left convexity meningioma with  underlying mass-effect on the left frontal and temporal lobes, adjacent edema, 6.1 x 5.9 x 4.2 cm, with 10 mm rightward midline shift at the level of the foraminal of [place].   Post surgically, he was treated with Keppra 500 mg twice a day for few months, he had no recurrent seizure, Keppra was stopped.   He used to work as a Hydrographic surveyor, was not able to go back to work since his left craniotomy, he now complains of memory loss, frequent headaches, confusion, difficulty sleeping, he also has language difficulty, this including comprehensive difficulty, and expressive aphasia, he also describes episode of suddenly lapse of memory while watching TV, could not follow up that episode, there was 3 episode of sudden onset of bowel incontinence, with mild confusion,   He now complains of daily moderate headaches, bilateral frontal, oftentimes it would be exacerbated to a more severe 7 out of 10 headache lasting for few minutes, multiple episode during the day, he also complains of intermittent right leg arm numbness, weakness   Positive surgical MRI of the brain with without contrast in May 2019 showed no residual meningioma, encephalomalacia involving left lateral temporal lobe and left frontal orbiculum,   UPDATE September 11 2017: He is very happy with lamotrigine 100 mg twice a day, no longer has headaches, no clinical seizure noted, And there was no significant side  effect noted.   UPDATE Mar 14 2018: He no longer has spells of sudden onset memory loss since lamotrigine 100mg  bid, Reported history of frequent headaches since his head injury concussion few years ago, now complains of increased headache, frequent pressure headaches, also multiple episode of sharp traveling shooting pain, he has been taking almost daily Excedrin Migraine with some help, during intense headaches he does has noise light sensitivity, movement made it worse,   He also complains of difficulty sleeping sometimes.    UPDATE Jan 02 2020: He is overall doing very well, tolerating lamotrigine 100 mg twice a day, no longer have seizure, his migraine is under excellent control, Imitrex 25 mg as needed works well most of the time, but still take a couple hours for his headache to go away   Laboratory evaluation in June 2021: Lamotrigine level 4.0, normal CMP, glucose of 120, CBC, hemoglobin 15.7   UPDATE Dec 13 2021: He is with his wife at visit, taking lamtorigine 100mg  bid, occasionally forget, has to go back to take medications.   His wife is very concerned about his increased memory loss, frequent confusion spells, she gives example of November 17, 2021, she called him to check on him as a routine, patient was at gas station, confused, does not know how to pump the gas, left the gas station.   He also often get confused about directions while driving, wife said sometimes he forgot his password, do not know how to log into his account which he manages frequently  Update January 25, 2022 SS: Here with his wife, doing well on higher dose Lamictal 150 mg twice daily. Since increasing, mood is better, not getting as frustrated with memory challenges. 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 We have recommended against driving. Migraines doing well, still on nortriptyline, takes Imitrex PRN, usually takes 1-2 times a month. Trouble remembering passwords for bank accounts, word finding, he goes get frustrated when he can't remember.   Labs 12/13/21 CBC, CMP, TSH, B12, RPR were unremarkable.  Lamictal level 4.9.  EEG was normal.  MRI of the brain with and without contrast showed encephalomalacia to the left temporal and frontal area.  High risk for recurrent seizure.  UPDATE Jun 02 2022: He is accompanied by his wife at today's visit, doing very well taking lamotrigine 100 mg 1 and half tablets twice a day, no longer have visible seizure spells, still has memory loss, occasionally word finding difficulties,  is in good mood,   Personally reviewed MRI of the brain with without contrast December 2023, remote left craniotomy with associated left temporal, frontal gliosis/encephalomalacia, with resultant left lateral ventricle enlargement, no recurrent tumor   Laboratory evaluations showed normal negative HIV, RPR, B12, TSH, CBC, lamotrigine level 4.9, CMP potassium of 3.4,  Update August 03, 2022 SS: We increased the nortriptyline to 30 mg at bedtime, he didn't make the change yet for migraines. 6-7 migraines per month currently. Imitrex works well, sometimes can't sleep after. Denies any seizures, on Lamictal 100 mg 1.5 tablets twice daily. His mood is okay, he lacks patience. Uses [employer] daily.   REVIEW OF SYSTEMS: Out of a complete 14 system review of symptoms, the patient complains only of the following symptoms, and all other reviewed systems are negative.  See HPI  ALLERGIES: Allergies  Allergen Reactions   Bee Venom Swelling    HOME MEDICATIONS: Outpatient Medications Prior to Visit  Medication Sig Dispense Refill   amLODipine (NORVASC)  10 MG tablet Take 10 mg by mouth every morning.      baclofen (LIORESAL) 10 MG tablet Take 0.5 tablets (5 mg total) by mouth 3 (three) times daily as needed for muscle spasms. 15 tablet 1   Fiber 500 MG CAPS Take 2 capsules by mouth 2 (two) times daily.     Multiple Vitamin (MULTIVITAMIN) capsule Take 1 capsule by mouth daily.     telmisartan (MICARDIS) 80 MG tablet Take 80 mg by mouth daily.  1   lamoTRIgine (LAMICTAL) 100 MG tablet Take 1.5 tablets (150 mg total) by mouth 2 (two) times daily. 270 tablet 3   nortriptyline (PAMELOR) 10 MG capsule Take 3 capsules (30 mg total) by mouth at bedtime. 270 capsule 1   SUMAtriptan (IMITREX) 50 MG tablet TAKE 1 TABLET BY MOUTH AT ONSET OF MIGRAINE. MAY REPEAT IN 2 HOURS IF NEEDED. MAXIMUM DAILY DOSE OF 2 TABLETS**30 DAYS SUPPLY** (Patient taking differently: Take 50 mg by mouth every 2 (two) hours as needed for  migraine. TAKE 1 TABLET BY MOUTH AT ONSET OF MIGRAINE. MAY REPEAT IN 2 HOURS IF NEEDED. MAXIMUM DAILY DOSE OF 2 TABLETS**30 DAYS SUPPLY**) 9 tablet 11   No facility-administered medications prior to visit.    PAST MEDICAL HISTORY: Past Medical History:  Diagnosis Date   Back pain    Benign non-nodular prostatic hyperplasia with lower urinary tract symptoms    Complication of anesthesia    pt complication with airway intubation due to irritation with tube with diaphram with have hiccups, need to be prescribed baclfen this helps to stop them   Expressive aphasia 02/2016   confusion/cognitive due to left frontal meningioma s/p  resection 03-03-2016   History of cerebral meningioma 02/2016   S/P  RESECTION 03-03-2016---  CAUSED EXPRESSIVE APHASIA  AND THIS CONTINUES   History of diverticulitis    History of gastroesophageal reflux (GERD)    History of penile cancer    SSCinsitu   Hypertension    Memory loss    Partial symptomatic epilepsy with complex partial seizures, not intractable, without status epilepticus (HCC)    neuruologist--- dr Terrace Arabia;  per pt wife no seizure since 2018   Poor historian    due to expressive aphasia/  congitive impairment   Urethral stricture    Wears dentures    upper   Wears glasses     PAST SURGICAL HISTORY: Past Surgical History:  Procedure Laterality Date   CRANIOTOMY Left 03/03/2016   Procedure: LEFT CRANIOTOMY FOR BRAIN TUMOR;  Surgeon: Tia Alert, MD;  Location: [facility] OR;  Service: Neurosurgery;  Laterality: Left;   CRANIOTOMY Left 03/21/2016   Procedure: IRRIGATION AND DEBRIDMENT OF CRANIOTOMY  AND EXPLORATION OF WOUND;  Surgeon: Tia Alert, MD;  Location: [facility] OR;  Service: Neurosurgery;  Laterality: Left;  IRRIGATION AND DEBRIDMENT OF CRANIOTOMY  AND EXPLORATION OF WOUND   CYSTOSCOPY WITH URETHRAL DILATATION N/A 03/22/2022   Procedure: CYSTOSCOPY WITH OPTILUME URETHRAL DILATATION;  Surgeon: Rene Paci, MD;  Location[facility];  Service: Urology;  Laterality: N/A;   KNEE SURGERY Left    NASAL RECONSTRUCTION WITH SEPTAL REPAIR N/A 11/18/2012   Procedure: NASAL RECONSTRUCTION WITH SEPTAL REPAIR;  Surgeon: Christia Reading, MD;  Location: [facility] OR;  Service: ENT;  Laterality: N/A;   PENILE BIOPSY N/A 07/11/2016   Procedure: EXCISION OF PENILE LESION;  Surgeon: Ihor Gully, MD;  Location: [facility] [place];  Service: Urology;  Laterality: N/A;    FAMILY HISTORY:  Family History  Problem Relation Age of Onset   Hypertension Mother    Abdominal Wall Hernia Mother    Diabetes Mother    Colon cancer Mother    Hypertension Father     SOCIAL HISTORY: Social History   Socioeconomic History   Marital status: Married    Spouse name: yolinda   Number of children: 0   Years of education: 12   Highest education level: High school graduate  Occupational History   Occupation: currently trying to get disablility  Tobacco Use   Smoking status: Former    Years: 27    Types: Cigarettes    Quit date: 07/04/2008    Years since quitting: 14.0   Smokeless tobacco: Never  Vaping Use   Vaping Use: Never used  Substance and Sexual Activity   Alcohol use: Not Currently    Comment: social - one drink every 2-3 months   Drug use: No   Sexual activity: Yes    Birth control/protection: None  Other Topics Concern   Not on file  Social History Narrative   Lives at home with his wife.   Right-handed.   Caffeine use:  4 cups per day.   Social Determinants of Health   Financial Resource Strain: Not on file  Food Insecurity: Not on file  Transportation Needs: Not on file  Physical Activity: Not on file  Stress: Not on file  Social Connections: Not on file  Intimate Partner Violence: Not on file    PHYSICAL EXAM  Vitals:   08/03/22 0844  BP: (!) 147/84  Pulse: 76  Weight: 217 lb 3.2 oz (98.5 kg)  Height: 5\' 7"  (1.702 m)    Body mass index is 34.02 kg/m.  Generalized: Well developed, in no  acute distress  Neurological examination  Mentation: Alert oriented to time, place, history taking. Follows all commands speech and language fluent but mildly slow Cranial nerve II-XII: Pupils were equal round reactive to light. Extraocular movements were full, visual field were full on confrontational test. Facial sensation and strength were normal.  Head turning and shoulder shrug  were normal and symmetric. Motor: The motor testing reveals 5 over 5 strength of all 4 extremities. Good symmetric motor tone is noted throughout.  Sensory: Sensory testing is intact to soft touch on all 4 extremities. No evidence of extinction is noted.  Coordination: Cerebellar testing reveals good finger-nose-finger and heel-to-shin bilaterally.  Gait and station: Gait is normal.   Reflexes: Deep tendon reflexes are symmetric and normal bilaterally.   DIAGNOSTIC DATA (LABS, IMAGING, TESTING) - I reviewed patient records, labs, notes, testing and imaging myself where available.  Lab Results  Component Value Date   WBC 9.5 12/13/2021   HGB 15.6 03/22/2022   HCT 46.0 03/22/2022   MCV 91 12/13/2021   PLT 242 07/22/2019      Component Value Date/Time   NA 141 03/22/2022 0944   NA 142 12/13/2021 1148   K 3.4 (L) 03/22/2022 0944   CL 101 03/22/2022 0944   CO2 24 12/13/2021 1148   GLUCOSE 120 (H) 03/22/2022 0944   BUN 9 03/22/2022 0944   BUN 11 12/13/2021 1148   CREATININE 0.90 03/22/2022 0944   CALCIUM 10.1 12/13/2021 1148   PROT 7.7 12/13/2021 1148   ALBUMIN 5.0 (H) 12/13/2021 1148   AST 28 12/13/2021 1148   ALT 29 12/13/2021 1148   ALKPHOS 88 12/13/2021 1148   BILITOT 0.5 12/13/2021 1148   GFRNONAA 83 07/22/2019 0904   GFRAA  96 07/22/2019 0904   No results found for: "CHOL", "HDL", "LDLCALC", "LDLDIRECT", "TRIG", "CHOLHDL" No results found for: "HGBA1C" Lab Results  Component Value Date   VITAMINB12 616 12/13/2021   Lab Results  Component Value Date   TSH 1.020 12/13/2021    Margie Ege, AGNP-C, DNP 08/03/2022, 9:21 AM [facility] [address], [address] [place], [place] [address] [phone]

## 2022-08-03 NOTE — Patient Instructions (Signed)
Will switch to nortriptyline 25 mg at bedtime for headache prevention. Continue other medications  Meds ordered this encounter  Medications   SUMAtriptan (IMITREX) 50 MG tablet    Sig: TAKE 1 TABLET BY MOUTH AT ONSET OF MIGRAINE. MAY REPEAT IN 2 HOURS IF NEEDED. MAXIMUM DAILY DOSE OF 2 TABLETS**30 DAYS SUPPLY**    Dispense:  9 tablet    Refill:  11   nortriptyline (PAMELOR) 25 MG capsule    Sig: Take 1 capsule (25 mg total) by mouth at bedtime.    Dispense:  90 capsule    Refill:  3   lamoTRIgine (LAMICTAL) 100 MG tablet    Sig: Take 1.5 tablets (150 mg total) by mouth 2 (two) times daily.    Dispense:  270 tablet    Refill:  3

## 2022-12-29 DIAGNOSIS — R972 Elevated prostate specific antigen [PSA]: Secondary | ICD-10-CM | POA: Diagnosis not present

## 2022-12-29 DIAGNOSIS — G43909 Migraine, unspecified, not intractable, without status migrainosus: Secondary | ICD-10-CM | POA: Diagnosis not present

## 2022-12-29 DIAGNOSIS — R7303 Prediabetes: Secondary | ICD-10-CM | POA: Diagnosis not present

## 2022-12-29 DIAGNOSIS — I10 Essential (primary) hypertension: Secondary | ICD-10-CM | POA: Diagnosis not present

## 2022-12-29 DIAGNOSIS — J309 Allergic rhinitis, unspecified: Secondary | ICD-10-CM | POA: Diagnosis not present

## 2022-12-29 DIAGNOSIS — N35011 Post-traumatic bulbous urethral stricture: Secondary | ICD-10-CM | POA: Diagnosis not present

## 2022-12-29 DIAGNOSIS — I69320 Aphasia following cerebral infarction: Secondary | ICD-10-CM | POA: Diagnosis not present

## 2022-12-29 DIAGNOSIS — N529 Male erectile dysfunction, unspecified: Secondary | ICD-10-CM | POA: Diagnosis not present

## 2022-12-29 DIAGNOSIS — Z23 Encounter for immunization: Secondary | ICD-10-CM | POA: Diagnosis not present

## 2022-12-29 DIAGNOSIS — G40209 Localization-related (focal) (partial) symptomatic epilepsy and epileptic syndromes with complex partial seizures, not intractable, without status epilepticus: Secondary | ICD-10-CM | POA: Diagnosis not present

## 2022-12-29 DIAGNOSIS — Z86018 Personal history of other benign neoplasm: Secondary | ICD-10-CM | POA: Diagnosis not present

## 2023-01-05 DIAGNOSIS — E876 Hypokalemia: Secondary | ICD-10-CM | POA: Diagnosis not present

## 2023-01-09 ENCOUNTER — Other Ambulatory Visit: Payer: Self-pay

## 2023-01-09 DIAGNOSIS — H52223 Regular astigmatism, bilateral: Secondary | ICD-10-CM | POA: Diagnosis not present

## 2023-01-09 DIAGNOSIS — Z135 Encounter for screening for eye and ear disorders: Secondary | ICD-10-CM | POA: Diagnosis not present

## 2023-01-09 DIAGNOSIS — H524 Presbyopia: Secondary | ICD-10-CM | POA: Diagnosis not present

## 2023-01-09 DIAGNOSIS — H2513 Age-related nuclear cataract, bilateral: Secondary | ICD-10-CM | POA: Diagnosis not present

## 2023-01-09 DIAGNOSIS — H5213 Myopia, bilateral: Secondary | ICD-10-CM | POA: Diagnosis not present

## 2023-01-09 MED ORDER — LAMOTRIGINE 100 MG PO TABS: 150.0000 mg | ORAL_TABLET | Freq: Two times a day (BID) | ORAL | 3 refills | Status: DC

## 2023-01-09 MED ORDER — NORTRIPTYLINE HCL 25 MG PO CAPS: 25.0000 mg | ORAL_CAPSULE | Freq: Every day | ORAL | 3 refills | Status: DC

## 2023-01-16 ENCOUNTER — Other Ambulatory Visit: Payer: Self-pay

## 2023-01-16 MED ORDER — NORTRIPTYLINE HCL 25 MG PO CAPS: 25.0000 mg | ORAL_CAPSULE | Freq: Every day | ORAL | 3 refills | Status: DC

## 2023-01-27 ENCOUNTER — Other Ambulatory Visit: Payer: Self-pay | Admitting: Neurology

## 2023-02-20 ENCOUNTER — Telehealth: Payer: Self-pay | Admitting: Neurology

## 2023-02-20 NOTE — Telephone Encounter (Signed)
Returned call to wife who stated that 3 weeks ago headaches became more frequent. Pt stated took 4 imitrex in past week but never took more than one day. I encouraged can take two in 24 hours and they voiced understanding. They mentioned already was able to schedule appt w/slack and would follow up w/her

## 2023-02-20 NOTE — Telephone Encounter (Signed)
Pt's wife, Kyle Chan 3 weeks ago headaches became more frequent. Have been taking medication as prescribed and taking Tylenol Having headaches every other day 3 times out of the week.

## 2023-02-21 NOTE — Progress Notes (Unsigned)
Patient: Kyle Chan Date of Birth: 1965-02-09  Reason for Visit: Follow up History from: Patient, wife  Primary Neurologist: Terrace Arabia  ASSESSMENT AND PLAN 58 y.o. year old male   1.  Status post large sided left meningioma resection February 2018 2.  High risk for partial seizure, history of recurrent spells of transient memory lapse 3.  Worsening memory loss 4.  Migraine headaches -Overall doing well -Decided to change nortriptyline to 25 mg at bedtime for migraine prevention (currently on 20 mg at bedtime) -Continue Imitrex as needed for acute headache -Continue Lamictal 150 mg twice daily for seizure prevention -Labs 12/13/21 CBC, CMP, TSH, B12, RPR were unremarkable.  Lamictal level 4.9.  EEG was normal.  MRI of the brain with and without contrast showed encephalomalacia to the left temporal and frontal area.  High risk for recurrent seizure  -Keep follow-up appointment in May 2025  No orders of the defined types were placed in this encounter.   HISTORY  Kyle Chan is a 58 year old male, accompanied by his wife Kyle Chan, seen in refer by his primary care physician Dr. Tally Joe, initial evaluation was on June 26, 2017.   He had left frontotemporal craniotomy for resection of meningioma on March 02, 2016.  He presented with sudden onset of confusion prior to the surgery,   MRI of the brain with without contrast on March 02, 2016 prior to surgery showed large left convexity meningioma with underlying mass-effect on the left frontal and temporal lobes, adjacent edema, 6.1 x 5.9 x 4.2 cm, with 10 mm rightward midline shift at the level of the foraminal of [place].   Post surgically, he was treated with Keppra 500 mg twice a day for few months, he had no recurrent seizure, Keppra was stopped.   He used to work as a Hydrographic surveyor, was not able to go back to work since his left craniotomy, he now complains of memory loss, frequent headaches, confusion, difficulty  sleeping, he also has language difficulty, this including comprehensive difficulty, and expressive aphasia, he also describes episode of suddenly lapse of memory while watching TV, could not follow up that episode, there was 3 episode of sudden onset of bowel incontinence, with mild confusion,   He now complains of daily moderate headaches, bilateral frontal, oftentimes it would be exacerbated to a more severe 7 out of 10 headache lasting for few minutes, multiple episode during the day, he also complains of intermittent right leg arm numbness, weakness   Positive surgical MRI of the brain with without contrast in May 2019 showed no residual meningioma, encephalomalacia involving left lateral temporal lobe and left frontal orbiculum,   UPDATE September 11 2017: He is very happy with lamotrigine 100 mg twice a day, no longer has headaches, no clinical seizure noted, And there was no significant side effect noted.   UPDATE Mar 14 2018: He no longer has spells of sudden onset memory loss since lamotrigine 100mg  bid, Reported history of frequent headaches since his head injury concussion few years ago, now complains of increased headache, frequent pressure headaches, also multiple episode of sharp traveling shooting pain, he has been taking almost daily Excedrin Migraine with some help, during intense headaches he does has noise light sensitivity, movement made it worse,   He also complains of difficulty sleeping sometimes.   UPDATE Jan 02 2020: He is overall doing very well, tolerating lamotrigine 100 mg twice a day, no longer have seizure, his migraine is under excellent control, Imitrex  25 mg as needed works well most of the time, but still take a couple hours for his headache to go away   Laboratory evaluation in June 2021: Lamotrigine level 4.0, normal CMP, glucose of 120, CBC, hemoglobin 15.7   UPDATE Dec 13 2021: He is with his wife at visit, taking lamtorigine 100mg  bid, occasionally forget,  has to go back to take medications.   His wife is very concerned about his increased memory loss, frequent confusion spells, she gives example of November 17, 2021, she called him to check on him as a routine, patient was at gas station, confused, does not know how to pump the gas, left the gas station.   He also often get confused about directions while driving, wife said sometimes he forgot his password, do not know how to log into his account which he manages frequently  Update January 25, 2022 SS: Here with his wife, doing well on higher dose Lamictal 150 mg twice daily. Since increasing, mood is better, not getting as frustrated with memory challenges. 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 We have recommended against driving. Migraines doing well, still on nortriptyline, takes Imitrex PRN, usually takes 1-2 times a month. Trouble remembering passwords for bank accounts, word finding, he goes get frustrated when he can't remember.   Labs 12/13/21 CBC, CMP, TSH, B12, RPR were unremarkable.  Lamictal level 4.9.  EEG was normal.  MRI of the brain with and without contrast showed encephalomalacia to the left temporal and frontal area.  High risk for recurrent seizure.  UPDATE Jun 02 2022: He is accompanied by his wife at today's visit, doing very well taking lamotrigine 100 mg 1 and half tablets twice a day, no longer have visible seizure spells, still has memory loss, occasionally word finding difficulties, is in good mood,   Personally reviewed MRI of the brain with without contrast December 2023, remote left craniotomy with associated left temporal, frontal gliosis/encephalomalacia, with resultant left lateral ventricle enlargement, no recurrent tumor   Laboratory evaluations showed normal negative HIV, RPR, B12, TSH, CBC, lamotrigine level 4.9, CMP potassium of 3.4,  Update August 03, 2022 SS: We increased the nortriptyline to 30 mg at bedtime, he didn't make the change yet for  migraines. 6-7 migraines per month currently. Imitrex works well, sometimes can't sleep after. Denies any seizures, on Lamictal 100 mg 1.5 tablets twice daily. His mood is okay, he lacks patience. Uses [employer] daily.   Update February 22, 2023 SS:   REVIEW OF SYSTEMS: Out of a complete 14 system review of symptoms, the patient complains only of the following symptoms, and all other reviewed systems are negative.  See HPI  ALLERGIES: Allergies  Allergen Reactions   Bee Venom Swelling    HOME MEDICATIONS: Outpatient Medications Prior to Visit  Medication Sig Dispense Refill   amLODipine (NORVASC) 10 MG tablet Take 10 mg by mouth every morning.      baclofen (LIORESAL) 10 MG tablet Take 0.5 tablets (5 mg total) by mouth 3 (three) times daily as needed for muscle spasms. 15 tablet 1   Fiber 500 MG CAPS Take 2 capsules by mouth 2 (two) times daily.     lamoTRIgine (LAMICTAL) 100 MG tablet TAKE 1 AND 1/2 TABLETS(150 MG) BY MOUTH TWICE DAILY 270 tablet 3   Multiple Vitamin (MULTIVITAMIN) capsule Take 1 capsule by mouth daily.     nortriptyline (PAMELOR) 25 MG capsule Take 1 capsule (25 mg total) by mouth at  bedtime. 90 capsule 3   SUMAtriptan (IMITREX) 50 MG tablet TAKE 1 TABLET BY MOUTH AT ONSET OF MIGRAINE. MAY REPEAT IN 2 HOURS IF NEEDED. MAXIMUM DAILY DOSE OF 2 TABLETS**30 DAYS SUPPLY** 9 tablet 11   telmisartan (MICARDIS) 80 MG tablet Take 80 mg by mouth daily.  1   No facility-administered medications prior to visit.    PAST MEDICAL HISTORY: Past Medical History:  Diagnosis Date   Back pain    Benign non-nodular prostatic hyperplasia with lower urinary tract symptoms    Complication of anesthesia    pt complication with airway intubation due to irritation with tube with diaphram with have hiccups, need to be prescribed baclfen this helps to stop them   Expressive aphasia 02/2016   confusion/cognitive due to left frontal meningioma s/p  resection 03-03-2016   History of cerebral  meningioma 02/2016   S/P  RESECTION 03-03-2016---  CAUSED EXPRESSIVE APHASIA  AND THIS CONTINUES   History of diverticulitis    History of gastroesophageal reflux (GERD)    History of penile cancer    SSCinsitu   Hypertension    Memory loss    Partial symptomatic epilepsy with complex partial seizures, not intractable, without status epilepticus (HCC)    neuruologist--- dr Terrace Arabia;  per pt wife no seizure since 2018   Poor historian    due to expressive aphasia/  congitive impairment   Urethral stricture    Wears dentures    upper   Wears glasses     PAST SURGICAL HISTORY: Past Surgical History:  Procedure Laterality Date   CRANIOTOMY Left 03/03/2016   Procedure: LEFT CRANIOTOMY FOR BRAIN TUMOR;  Surgeon: Tia Alert, MD;  Location: [facility] OR;  Service: Neurosurgery;  Laterality: Left;   CRANIOTOMY Left 03/21/2016   Procedure: IRRIGATION AND DEBRIDMENT OF CRANIOTOMY  AND EXPLORATION OF WOUND;  Surgeon: Tia Alert, MD;  Location: [facility] OR;  Service: Neurosurgery;  Laterality: Left;  IRRIGATION AND DEBRIDMENT OF CRANIOTOMY  AND EXPLORATION OF WOUND   CYSTOSCOPY WITH URETHRAL DILATATION N/A 03/22/2022   Procedure: CYSTOSCOPY WITH OPTILUME URETHRAL DILATATION;  Surgeon: Rene Paci, MD;  Location: [facility];  Service: Urology;  Laterality: N/A;   KNEE SURGERY Left    NASAL RECONSTRUCTION WITH SEPTAL REPAIR N/A 11/18/2012   Procedure: NASAL RECONSTRUCTION WITH SEPTAL REPAIR;  Surgeon: Christia Reading, MD;  Location: [facility] OR;  Service: ENT;  Laterality: N/A;   PENILE BIOPSY N/A 07/11/2016   Procedure: EXCISION OF PENILE LESION;  Surgeon: Ihor Gully, MD;  Location: [facility];  Service: Urology;  Laterality: N/A;    FAMILY HISTORY: Family History  Problem Relation Age of Onset   Hypertension Mother    Abdominal Wall Hernia Mother    Diabetes Mother    Colon cancer Mother    Hypertension Father     SOCIAL HISTORY: Social History    Socioeconomic History   Marital status: Married    Spouse name: yolinda   Number of children: 0   Years of education: 12   Highest education level: High school graduate  Occupational History   Occupation: currently trying to get disablility  Tobacco Use   Smoking status: Former    Current packs/day: 0.00    Types: Cigarettes    Start date: 07/04/1981    Quit date: 07/04/2008    Years since quitting: 14.6   Smokeless tobacco: Never  Vaping Use   Vaping status: Never Used  Substance and Sexual Activity   Alcohol  use: Not Currently    Comment: social - one drink every 2-3 months   Drug use: No   Sexual activity: Yes    Birth control/protection: None  Other Topics Concern   Not on file  Social History Narrative   Lives at home with his wife.   Right-handed.   Caffeine use:  4 cups per day.   Social Drivers of Corporate investment banker Strain: Not on file  Food Insecurity: Not on file  Transportation Needs: Not on file  Physical Activity: Not on file  Stress: Not on file  Social Connections: Not on file  Intimate Partner Violence: Not on file    PHYSICAL EXAM  There were no vitals filed for this visit.   There is no height or weight on file to calculate BMI.  Generalized: Well developed, in no acute distress  Neurological examination  Mentation: Alert oriented to time, place, history taking. Follows all commands speech and language fluent but mildly slow Cranial nerve II-XII: Pupils were equal round reactive to light. Extraocular movements were full, visual field were full on confrontational test. Facial sensation and strength were normal.  Head turning and shoulder shrug  were normal and symmetric. Motor: The motor testing reveals 5 over 5 strength of all 4 extremities. Good symmetric motor tone is noted throughout.  Sensory: Sensory testing is intact to soft touch on all 4 extremities. No evidence of extinction is noted.  Coordination: Cerebellar testing  reveals good finger-nose-finger and heel-to-shin bilaterally.  Gait and station: Gait is normal.   Reflexes: Deep tendon reflexes are symmetric and normal bilaterally.   DIAGNOSTIC DATA (LABS, IMAGING, TESTING) - I reviewed patient records, labs, notes, testing and imaging myself where available.  Lab Results  Component Value Date   WBC 9.5 12/13/2021   HGB 15.6 03/22/2022   HCT 46.0 03/22/2022   MCV 91 12/13/2021   PLT 242 07/22/2019      Component Value Date/Time   NA 141 03/22/2022 0944   NA 142 12/13/2021 1148   K 3.4 (L) 03/22/2022 0944   CL 101 03/22/2022 0944   CO2 24 12/13/2021 1148   GLUCOSE 120 (H) 03/22/2022 0944   BUN 9 03/22/2022 0944   BUN 11 12/13/2021 1148   CREATININE 0.90 03/22/2022 0944   CALCIUM 10.1 12/13/2021 1148   PROT 7.7 12/13/2021 1148   ALBUMIN 5.0 (H) 12/13/2021 1148   AST 28 12/13/2021 1148   ALT 29 12/13/2021 1148   ALKPHOS 88 12/13/2021 1148   BILITOT 0.5 12/13/2021 1148   GFRNONAA 83 07/22/2019 0904   GFRAA 96 07/22/2019 0904   No results found for: "CHOL", "HDL", "LDLCALC", "LDLDIRECT", "TRIG", "CHOLHDL" No results found for: "HGBA1C" Lab Results  Component Value Date   VITAMINB12 616 12/13/2021   Lab Results  Component Value Date   TSH 1.020 12/13/2021    Margie Ege, AGNP-C, DNP 02/21/2023, 9:06 AM [facility] [address] [place], [place] [address] [phone]

## 2023-02-22 ENCOUNTER — Ambulatory Visit: Payer: Medicare HMO | Admitting: Neurology

## 2023-02-22 ENCOUNTER — Encounter: Payer: Self-pay | Admitting: Neurology

## 2023-02-22 VITALS — BP 136/72 | Ht 67.0 in | Wt 223.0 lb

## 2023-02-22 DIAGNOSIS — G40209 Localization-related (focal) (partial) symptomatic epilepsy and epileptic syndromes with complex partial seizures, not intractable, without status epilepticus: Secondary | ICD-10-CM | POA: Diagnosis not present

## 2023-02-22 DIAGNOSIS — R519 Headache, unspecified: Secondary | ICD-10-CM | POA: Diagnosis not present

## 2023-02-22 DIAGNOSIS — Z9889 Other specified postprocedural states: Secondary | ICD-10-CM | POA: Diagnosis not present

## 2023-02-22 DIAGNOSIS — Z8603 Personal history of neoplasm of uncertain behavior: Secondary | ICD-10-CM | POA: Diagnosis not present

## 2023-02-22 MED ORDER — NORTRIPTYLINE HCL 10 MG PO CAPS: 10.0000 mg | ORAL_CAPSULE | Freq: Every day | ORAL | 3 refills | Status: DC

## 2023-02-22 MED ORDER — NORTRIPTYLINE HCL 25 MG PO CAPS: 25.0000 mg | ORAL_CAPSULE | Freq: Every day | ORAL | Status: DC

## 2023-02-22 NOTE — Patient Instructions (Signed)
Add nortriptyline 10 mg to 25 mg at bedtime for headache, if no improvement let me know.

## 2023-04-11 ENCOUNTER — Emergency Department (HOSPITAL_COMMUNITY)
Admission: EM | Admit: 2023-04-11 | Discharge: 2023-04-11 | Disposition: A | Discharge status: Home / Self Care | Attending: Emergency Medicine | Admitting: Emergency Medicine

## 2023-04-11 ENCOUNTER — Encounter (HOSPITAL_COMMUNITY): Payer: Self-pay | Admitting: Emergency Medicine

## 2023-04-11 ENCOUNTER — Other Ambulatory Visit: Payer: Self-pay

## 2023-04-11 DIAGNOSIS — J111 Influenza due to unidentified influenza virus with other respiratory manifestations: Secondary | ICD-10-CM

## 2023-04-11 DIAGNOSIS — R509 Fever, unspecified: Secondary | ICD-10-CM | POA: Diagnosis present

## 2023-04-11 DIAGNOSIS — J101 Influenza due to other identified influenza virus with other respiratory manifestations: Secondary | ICD-10-CM | POA: Diagnosis not present

## 2023-04-11 LAB — RESP PANEL BY RT-PCR (RSV, FLU A&B, COVID)  RVPGX2
Influenza A by PCR: POSITIVE — AB
Influenza B by PCR: NEGATIVE
Resp Syncytial Virus by PCR: NEGATIVE
SARS Coronavirus 2 by RT PCR: NEGATIVE

## 2023-04-11 MED ORDER — ACETAMINOPHEN 325 MG PO TABS
650.0000 mg | ORAL_TABLET | Freq: Once | ORAL | Status: AC
  Administered 2023-04-11: 650 mg via ORAL
  Filled 2023-04-11: qty 2

## 2023-04-11 NOTE — Discharge Instructions (Signed)
Use Tylenol and or ibuprofen as needed

## 2023-04-11 NOTE — ED Provider Notes (Signed)
 [place] EMERGENCY DEPARTMENT AT [facility] Provider Note   CSN: [id number] Arrival date & time: 04/11/23  1840     History  Chief Complaint  Patient presents with   Influenza    Kyle Chan is a 58 y.o. male.  58 year old male presents with URI symptoms x 24 hours.  Notes increasing cough is nonproductive.  Endorses fever.  Has had sinus congestion.  No vomiting or diarrhea.  Unrelieved with home medications.       Home Medications Prior to Admission medications   Medication Sig Start Date End Date Taking? Authorizing Provider  amLODipine (NORVASC) 10 MG tablet Take 10 mg by mouth every morning.  11/23/12   [provider]  Fiber 500 MG CAPS Take 2 capsules by mouth 2 (two) times daily.    [provider]  lamoTRIgine (LAMICTAL) 100 MG tablet TAKE 1 AND 1/2 TABLETS(150 MG) BY MOUTH TWICE DAILY 01/31/23   Glean Salvo, NP  Multiple Vitamin (MULTIVITAMIN) capsule Take 1 capsule by mouth daily.    [provider]  nortriptyline (PAMELOR) 10 MG capsule Take 1 capsule (10 mg total) by mouth at bedtime. Add to the 25 mg nortriptyline 02/22/23   Glean Salvo, NP  nortriptyline (PAMELOR) 25 MG capsule Take 1 capsule (25 mg total) by mouth at bedtime. 02/22/23   Glean Salvo, NP  SUMAtriptan (IMITREX) 50 MG tablet TAKE 1 TABLET BY MOUTH AT ONSET OF MIGRAINE. MAY REPEAT IN 2 HOURS IF NEEDED. MAXIMUM DAILY DOSE OF 2 TABLETS**30 DAYS SUPPLY** 08/03/22   Glean Salvo, NP  telmisartan (MICARDIS) 80 MG tablet Take 80 mg by mouth daily. 04/28/17   [provider]      Allergies    Bee venom    Review of Systems   Review of Systems  All other systems reviewed and are negative.   Physical Exam Updated Vital Signs BP (!) 179/72 (BP Location: Left Arm)   Pulse 98   Temp (!) 101.7 F (38.7 C) (Oral)   Resp 16   Ht 1.702 m (5\' 7" )   Wt 102.1 kg   SpO2 98%   BMI 35.24 kg/m  Physical Exam Vitals and nursing note reviewed.   Constitutional:      General: He is not in acute distress.    Appearance: Normal appearance. He is well-developed. He is not toxic-appearing.  HENT:     Head: Normocephalic and atraumatic.  Eyes:     General: Lids are normal.     Conjunctiva/sclera: Conjunctivae normal.     Pupils: Pupils are equal, round, and reactive to light.  Neck:     Thyroid: No thyroid mass.     Trachea: No tracheal deviation.  Cardiovascular:     Rate and Rhythm: Normal rate and regular rhythm.     Heart sounds: Normal heart sounds. No murmur heard.    No gallop.  Pulmonary:     Effort: Pulmonary effort is normal. No respiratory distress.     Breath sounds: Normal breath sounds. No stridor. No decreased breath sounds, wheezing, rhonchi or rales.  Abdominal:     General: There is no distension.     Palpations: Abdomen is soft.     Tenderness: There is no abdominal tenderness. There is no rebound.  Musculoskeletal:        General: No tenderness. Normal range of motion.     Cervical back: Normal range of motion and neck supple.  Skin:    General: Skin  is warm and dry.     Findings: No abrasion or rash.  Neurological:     Mental Status: He is alert and oriented to person, place, and time. Mental status is at baseline.     GCS: GCS eye subscore is 4. GCS verbal subscore is 5. GCS motor subscore is 6.     Cranial Nerves: No cranial nerve deficit.     Sensory: No sensory deficit.     Motor: Motor function is intact.  Psychiatric:        Attention and Perception: Attention normal.        Speech: Speech normal.        Behavior: Behavior normal.     ED Results / Procedures / Treatments   Labs (all labs ordered are listed, but only abnormal results are displayed) Labs Reviewed  RESP PANEL BY RT-PCR (RSV, FLU A&B, COVID)  [id number] - Abnormal; Notable for the following components:      Result Value   Influenza A by PCR POSITIVE (*)    All other components within normal limits     EKG None  Radiology No results found.  Procedures Procedures    Medications Ordered in ED Medications - No data to display  ED Course/ Medical Decision Making/ A&P                                 Medical Decision Making  Influenza is positive here.  Will give Tylenol here.  Patient will be discharged        Final Clinical Impression(s) / ED Diagnoses Final diagnoses:  None    Rx / DC Orders ED Discharge Orders     None         Lorre Nick, MD 04/11/23 2204

## 2023-04-11 NOTE — ED Triage Notes (Signed)
 Cough, congestion, fever, headache, body aches that started last night.

## 2023-04-11 NOTE — ED Notes (Signed)
 ED Provider at bedside.

## 2023-05-22 ENCOUNTER — Other Ambulatory Visit: Payer: Self-pay | Admitting: Anesthesiology

## 2023-05-22 MED ORDER — NORTRIPTYLINE HCL 25 MG PO CAPS: 25.0000 mg | ORAL_CAPSULE | Freq: Every day | ORAL | Status: DC

## 2023-05-22 MED ORDER — NORTRIPTYLINE HCL 10 MG PO CAPS: 10.0000 mg | ORAL_CAPSULE | Freq: Every day | ORAL | 3 refills | Status: DC

## 2023-06-08 ENCOUNTER — Ambulatory Visit (INDEPENDENT_AMBULATORY_CARE_PROVIDER_SITE_OTHER): Payer: Medicare HMO | Admitting: Neurology

## 2023-06-08 VITALS — BP 146/80 | HR 74 | Ht 67.0 in | Wt 222.0 lb

## 2023-06-08 DIAGNOSIS — G40209 Localization-related (focal) (partial) symptomatic epilepsy and epileptic syndromes with complex partial seizures, not intractable, without status epilepticus: Secondary | ICD-10-CM | POA: Diagnosis not present

## 2023-06-08 DIAGNOSIS — R519 Headache, unspecified: Secondary | ICD-10-CM

## 2023-06-08 MED ORDER — NORTRIPTYLINE HCL 10 MG PO CAPS: 10.0000 mg | ORAL_CAPSULE | Freq: Every day | ORAL | 3 refills | Status: DC

## 2023-06-08 MED ORDER — LAMOTRIGINE 100 MG PO TABS: 150.0000 mg | ORAL_TABLET | Freq: Two times a day (BID) | ORAL | 3 refills | Status: AC

## 2023-06-08 MED ORDER — NORTRIPTYLINE HCL 25 MG PO CAPS: 25.0000 mg | ORAL_CAPSULE | Freq: Every day | ORAL | 0 refills | Status: AC

## 2023-06-08 MED ORDER — SUMATRIPTAN SUCCINATE 50 MG PO TABS: ORAL_TABLET | ORAL | 11 refills | Status: AC

## 2023-06-08 NOTE — Patient Instructions (Signed)
 Please continue current medications.  If your headaches increase please let me know.  Call for any seizure activity.  Follow-up in 1 year.  Thanks

## 2023-06-08 NOTE — Progress Notes (Signed)
 Patient: Kyle Chan Date of Birth: Feb 27, 1965  Reason for Visit: Follow up History from: Patient, wife  Primary Neurologist: Gracie Lav  ASSESSMENT AND PLAN 58 y.o. year old male   1.  Status post large sided left meningioma resection February 2018 2.  High risk for partial seizure, history of recurrent spells of transient memory lapse 3.  Memory loss 4.  Migraine headaches  -Headaches improved with higher dose nortriptyline  -Continue nortriptyline  35 mg at bedtime (25+10 mg capsule) for headache prevention  -Continue Tylenol  as needed  -Next steps: Could further increase nortriptyline , add gabapentin, repeat MRI imaging  -Continue Imitrex  as needed for acute headache -Continue Lamictal  150 mg twice daily for seizure prevention -Labs 12/13/21 CBC, CMP, TSH, B12, RPR were unremarkable.  Lamictal  level 4.9.  EEG was normal.  MRI of the brain with and without contrast showed encephalomalacia to the left temporal and frontal area.  High risk for recurrent seizure  -PCP follows labs, return in 1 year, call for new or worsening symptoms   Meds ordered this encounter  Medications   nortriptyline  (PAMELOR ) 10 MG capsule    Sig: Take 1 capsule (10 mg total) by mouth at bedtime. Add to the 25 mg nortriptyline     Dispense:  90 capsule    Refill:  3   nortriptyline  (PAMELOR ) 25 MG capsule    Sig: Take 1 capsule (25 mg total) by mouth at bedtime.    Dispense:  90 capsule    Refill:  0   lamoTRIgine  (LAMICTAL ) 100 MG tablet    Sig: Take 1.5 tablets (150 mg total) by mouth 2 (two) times daily.    Dispense:  270 tablet    Refill:  3   SUMAtriptan  (IMITREX ) 50 MG tablet    Sig: TAKE 1 TABLET BY MOUTH AT ONSET OF MIGRAINE. MAY REPEAT IN 2 HOURS IF NEEDED. MAXIMUM DAILY DOSE OF 2 TABLETS**30 DAYS SUPPLY**    Dispense:  9 tablet    Refill:  11    HISTORY  DEVONN Chan is a 58 year old male, accompanied by his wife Kyle Chan, seen in refer by his primary care physician Dr. Rae Bugler,  initial evaluation was on June 26, 2017.   He had left frontotemporal craniotomy for resection of meningioma on March 02, 2016.  He presented with sudden onset of confusion prior to the surgery,   MRI of the brain with without contrast on March 02, 2016 prior to surgery showed large left convexity meningioma with underlying mass-effect on the left frontal and temporal lobes, adjacent edema, 6.1 x 5.9 x 4.2 cm, with 10 mm rightward midline shift at the level of the foraminal of [place].   Post surgically, he was treated with Keppra  500 mg twice a day for few months, he had no recurrent seizure, Keppra  was stopped.   He used to work as a Hydrographic surveyor, was not able to go back to work since his left craniotomy, he now complains of memory loss, frequent headaches, confusion, difficulty sleeping, he also has language difficulty, this including comprehensive difficulty, and expressive aphasia, he also describes episode of suddenly lapse of memory while watching TV, could not follow up that episode, there was 3 episode of sudden onset of bowel incontinence, with mild confusion,   He now complains of daily moderate headaches, bilateral frontal, oftentimes it would be exacerbated to a more severe 7 out of 10 headache lasting for few minutes, multiple episode during the day, he also complains of intermittent right  leg arm numbness, weakness   Positive surgical MRI of the brain with without contrast in May 2019 showed no residual meningioma, encephalomalacia involving left lateral temporal lobe and left frontal orbiculum,   UPDATE September 11 2017: He is very happy with lamotrigine  100 mg twice a day, no longer has headaches, no clinical seizure noted, And there was no significant side effect noted.   UPDATE Mar 14 2018: He no longer has spells of sudden onset memory loss since lamotrigine  100mg  bid, Reported history of frequent headaches since his head injury concussion few years ago, now  complains of increased headache, frequent pressure headaches, also multiple episode of sharp traveling shooting pain, he has been taking almost daily Excedrin Migraine with some help, during intense headaches he does has noise light sensitivity, movement made it worse,   He also complains of difficulty sleeping sometimes.   UPDATE Jan 02 2020: He is overall doing very well, tolerating lamotrigine  100 mg twice a day, no longer have seizure, his migraine is under excellent control, Imitrex  25 mg as needed works well most of the time, but still take a couple hours for his headache to go away   Laboratory evaluation in June 2021: Lamotrigine  level 4.0, normal CMP, glucose of 120, CBC, hemoglobin 15.7   UPDATE Dec 13 2021: He is with his wife at visit, taking lamtorigine 100mg  bid, occasionally forget, has to go back to take medications.   His wife is very concerned about his increased memory loss, frequent confusion spells, she gives example of November 17, 2021, she called him to check on him as a routine, patient was at gas station, confused, does not know how to pump the gas, left the gas station.   He also often get confused about directions while driving, wife said sometimes he forgot his password, do not know how to log into his account which he manages frequently  Update January 25, 2022 SS: Here with his wife, doing well on higher dose Lamictal  150 mg twice daily. Since increasing, mood is better, not getting as frustrated with memory challenges. 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 We have recommended against driving. Migraines doing well, still on nortriptyline , takes Imitrex  PRN, usually takes 1-2 times a month. Trouble remembering passwords for bank accounts, word finding, he goes get frustrated when he can't remember.   Labs 12/13/21 CBC, CMP, TSH, B12, RPR were unremarkable.  Lamictal  level 4.9.  EEG was normal.  MRI of the brain with and without contrast showed  encephalomalacia to the left temporal and frontal area.  High risk for recurrent seizure.  UPDATE Jun 02 2022: He is accompanied by his wife at today's visit, doing very well taking lamotrigine  100 mg 1 and half tablets twice a day, no longer have visible seizure spells, still has memory loss, occasionally word finding difficulties, is in good mood,   Personally reviewed MRI of the brain with without contrast December 2023, remote left craniotomy with associated left temporal, frontal gliosis/encephalomalacia, with resultant left lateral ventricle enlargement, no recurrent tumor   Laboratory evaluations showed normal negative HIV, RPR, B12, TSH, CBC, lamotrigine  level 4.9, CMP potassium of 3.4,  Update August 03, 2022 SS: We increased the nortriptyline  to 30 mg at bedtime, he didn't make the change yet for migraines. 6-7 migraines per month currently. Imitrex  works well, sometimes can't sleep after. Denies any seizures, on Lamictal  100 mg 1.5 tablets twice daily. His mood is okay, he lacks patience. Uses Pel[employer]  daily.   Update February 22, 2023 SS: Having more headaches, typical headache is bilateral temple, severe pain. We increased nortriptyline  to 25 mg with great benefit. Now headache, on the left side, 2-3 weeks, notices in the evening before bed when putting pressure lying on pillow, will take 2 tylenol  with good benefit. No other symptoms. No triggers. Remains active during the day, rides his peloton, overall feels fine during the day.  No vision change, no jaw pain, no illness.   Update Jun 08, 2023 SS: Headaches are better, 2 in the last month, taking higher dose nortriptyline  35 mg at bedtime. Takes Tylenol  as needed. No seizures, remains on Lamictal  150 mg twice daily.  Chronic insomnia.  No seizures.  Remains on Lamictal  150 mg twice a day.  REVIEW OF SYSTEMS: Out of a complete 14 system review of symptoms, the patient complains only of the following symptoms, and all other reviewed systems  are negative.  See HPI  ALLERGIES: Allergies  Allergen Reactions   Bee Venom Swelling    HOME MEDICATIONS: Outpatient Medications Prior to Visit  Medication Sig Dispense Refill   amLODipine  (NORVASC ) 10 MG tablet Take 10 mg by mouth every morning.      Fiber 500 MG CAPS Take 2 capsules by mouth 2 (two) times daily.     Multiple Vitamin (MULTIVITAMIN) capsule Take 1 capsule by mouth daily.     telmisartan (MICARDIS) 80 MG tablet Take 80 mg by mouth daily.  1   lamoTRIgine  (LAMICTAL ) 100 MG tablet TAKE 1 AND 1/2 TABLETS(150 MG) BY MOUTH TWICE DAILY 270 tablet 3   nortriptyline  (PAMELOR ) 10 MG capsule Take 1 capsule (10 mg total) by mouth at bedtime. Add to the 25 mg nortriptyline  30 capsule 3   nortriptyline  (PAMELOR ) 25 MG capsule Take 1 capsule (25 mg total) by mouth at bedtime.     SUMAtriptan  (IMITREX ) 50 MG tablet TAKE 1 TABLET BY MOUTH AT ONSET OF MIGRAINE. MAY REPEAT IN 2 HOURS IF NEEDED. MAXIMUM DAILY DOSE OF 2 TABLETS**30 DAYS SUPPLY** 9 tablet 11   No facility-administered medications prior to visit.    PAST MEDICAL HISTORY: Past Medical History:  Diagnosis Date   Back pain    Benign non-nodular prostatic hyperplasia with lower urinary tract symptoms    Complication of anesthesia    pt complication with airway intubation due to irritation with tube with diaphram with have hiccups, need to be prescribed baclfen this helps to stop them   Expressive aphasia 02/2016   confusion/cognitive due to left frontal meningioma s/p  resection 03-03-2016   History of cerebral meningioma 02/2016   S/P  RESECTION 03-03-2016---  CAUSED EXPRESSIVE APHASIA  AND THIS CONTINUES   History of diverticulitis    History of gastroesophageal reflux (GERD)    History of penile cancer    SSCinsitu   Hypertension    Memory loss    Partial symptomatic epilepsy with complex partial seizures, not intractable, without status epilepticus (HCC)    neuruologist--- dr Gracie Lav;  per pt wife no seizure since  2018   Poor historian    due to expressive aphasia/  congitive impairment   Urethral stricture    Wears dentures    upper   Wears glasses     PAST SURGICAL HISTORY: Past Surgical History:  Procedure Laterality Date   CRANIOTOMY Left 03/03/2016   Procedure: LEFT CRANIOTOMY FOR BRAIN TUMOR;  Surgeon: Isadora Mar, MD;  Location: [facility] OR;  Service: Neurosurgery;  Laterality: Left;  CRANIOTOMY Left 03/21/2016   Procedure: IRRIGATION AND DEBRIDMENT OF CRANIOTOMY  AND EXPLORATION OF WOUND;  Surgeon: Isadora Mar, MD;  Location: [facility] OR;  Service: Neurosurgery;  Laterality: Left;  IRRIGATION AND DEBRIDMENT OF CRANIOTOMY  AND EXPLORATION OF WOUND   CYSTOSCOPY WITH URETHRAL DILATATION N/A 03/22/2022   Procedure: CYSTOSCOPY WITH OPTILUME URETHRAL DILATATION;  Surgeon: Adelbert Homans, MD;  Location: [facility];  Service: Urology;  Laterality: N/A;   KNEE SURGERY Left    NASAL RECONSTRUCTION WITH SEPTAL REPAIR N/A 11/18/2012   Procedure: NASAL RECONSTRUCTION WITH SEPTAL REPAIR;  Surgeon: Virgina Grills, MD;  Location: [facility] OR;  Service: ENT;  Laterality: N/A;   PENILE BIOPSY N/A 07/11/2016   Procedure: EXCISION OF PENILE LESION;  Surgeon: Ottelin, Mark, MD;  Location: [facility] [place];  Service: Urology;  Laterality: N/A;    FAMILY HISTORY: Family History  Problem Relation Age of Onset   Hypertension Mother    Abdominal Wall Hernia Mother    Diabetes Mother    Colon cancer Mother    Hypertension Father     SOCIAL HISTORY: Social History   Socioeconomic History   Marital status: Married    Spouse name: yolinda   Number of children: 0   Years of education: 12   Highest education level: High school graduate  Occupational History   Occupation: currently trying to get disablility  Tobacco Use   Smoking status: Former    Current packs/day: 0.00    Types: Cigarettes    Start date: 07/04/1981    Quit date: 07/04/2008    Years since quitting: 14.9    Smokeless tobacco: Never  Vaping Use   Vaping status: Never Used  Substance and Sexual Activity   Alcohol use: Not Currently    Comment: social - one drink every 2-3 months   Drug use: No   Sexual activity: Yes    Birth control/protection: None  Other Topics Concern   Not on file  Social History Narrative   Lives at home with his wife.   Right-handed.   Caffeine use:  4 cups per day.   Social Drivers of Corporate investment banker Strain: Not on file  Food Insecurity: Not on file  Transportation Needs: Not on file  Physical Activity: Not on file  Stress: Not on file  Social Connections: Not on file  Intimate Partner Violence: Not on file   PHYSICAL EXAM  Vitals:   06/08/23 1049 06/08/23 1055  BP: (!) 144/92 (!) 146/80  Pulse: 74   Weight: 222 lb (100.7 kg)   Height: 5\' 7"  (1.702 m)     Body mass index is 34.77 kg/m.  Generalized: Well developed, in no acute distress  Neurological examination  Mentation: Alert oriented to time, place, history taking. Follows all commands speech and language fluent but mildly slow Cranial nerve II-XII: Pupils were equal round reactive to light. Extraocular movements were full, visual field were full on confrontational test. Facial sensation and strength were normal.  Head turning and shoulder shrug  were normal and symmetric. Motor: The motor testing reveals 5 over 5 strength of all 4 extremities. Good symmetric motor tone is noted throughout.  Sensory: Sensory testing is intact to soft touch on all 4 extremities. No evidence of extinction is noted.  Coordination: Cerebellar testing reveals good finger-nose-finger and heel-to-shin bilaterally.  Gait and station: Gait is normal.   Reflexes: Deep tendon reflexes are symmetric and normal bilaterally.   DIAGNOSTIC DATA (LABS, IMAGING, TESTING) -  I reviewed patient records, labs, notes, testing and imaging myself where available.  Lab Results  Component Value Date   WBC 9.5 12/13/2021    HGB 15.6 03/22/2022   HCT 46.0 03/22/2022   MCV 91 12/13/2021   PLT 242 07/22/2019      Component Value Date/Time   NA 141 03/22/2022 0944   NA 142 12/13/2021 1148   K 3.4 (L) 03/22/2022 0944   CL 101 03/22/2022 0944   CO2 24 12/13/2021 1148   GLUCOSE 120 (H) 03/22/2022 0944   BUN 9 03/22/2022 0944   BUN 11 12/13/2021 1148   CREATININE 0.90 03/22/2022 0944   CALCIUM  10.1 12/13/2021 1148   PROT 7.7 12/13/2021 1148   ALBUMIN 5.0 (H) 12/13/2021 1148   AST 28 12/13/2021 1148   ALT 29 12/13/2021 1148   ALKPHOS 88 12/13/2021 1148   BILITOT 0.5 12/13/2021 1148   GFRNONAA 83 07/22/2019 0904   GFRAA 96 07/22/2019 0904   No results found for: "CHOL", "HDL", "LDLCALC", "LDLDIRECT", "TRIG", "CHOLHDL" No results found for: "HGBA1C" Lab Results  Component Value Date   VITAMINB12 616 12/13/2021   Lab Results  Component Value Date   TSH 1.020 12/13/2021    Jeanmarie Millet, AGNP-C, DNP 06/08/2023, 11:21 AM [facility] [address] [place], [place] [address] [phone]

## 2023-06-27 DIAGNOSIS — R972 Elevated prostate specific antigen [PSA]: Secondary | ICD-10-CM | POA: Diagnosis not present

## 2023-07-04 ENCOUNTER — Other Ambulatory Visit: Payer: Self-pay | Admitting: Urology

## 2023-07-04 DIAGNOSIS — R972 Elevated prostate specific antigen [PSA]: Secondary | ICD-10-CM

## 2023-07-05 ENCOUNTER — Encounter: Payer: Self-pay | Admitting: Urology

## 2023-07-07 DIAGNOSIS — Z8549 Personal history of malignant neoplasm of other male genital organs: Secondary | ICD-10-CM | POA: Diagnosis not present

## 2023-07-07 DIAGNOSIS — R7303 Prediabetes: Secondary | ICD-10-CM | POA: Diagnosis not present

## 2023-07-07 DIAGNOSIS — N400 Enlarged prostate without lower urinary tract symptoms: Secondary | ICD-10-CM | POA: Diagnosis not present

## 2023-07-07 DIAGNOSIS — G40209 Localization-related (focal) (partial) symptomatic epilepsy and epileptic syndromes with complex partial seizures, not intractable, without status epilepticus: Secondary | ICD-10-CM | POA: Diagnosis not present

## 2023-07-07 DIAGNOSIS — G43909 Migraine, unspecified, not intractable, without status migrainosus: Secondary | ICD-10-CM | POA: Diagnosis not present

## 2023-07-07 DIAGNOSIS — Z23 Encounter for immunization: Secondary | ICD-10-CM | POA: Diagnosis not present

## 2023-07-07 DIAGNOSIS — Z0000 Encounter for general adult medical examination without abnormal findings: Secondary | ICD-10-CM | POA: Diagnosis not present

## 2023-07-07 DIAGNOSIS — Z86018 Personal history of other benign neoplasm: Secondary | ICD-10-CM | POA: Diagnosis not present

## 2023-07-07 DIAGNOSIS — I10 Essential (primary) hypertension: Secondary | ICD-10-CM | POA: Diagnosis not present

## 2023-07-07 DIAGNOSIS — I69320 Aphasia following cerebral infarction: Secondary | ICD-10-CM | POA: Diagnosis not present

## 2023-07-07 DIAGNOSIS — G4700 Insomnia, unspecified: Secondary | ICD-10-CM | POA: Diagnosis not present

## 2023-07-31 DIAGNOSIS — I10 Essential (primary) hypertension: Secondary | ICD-10-CM | POA: Diagnosis not present

## 2023-08-04 ENCOUNTER — Other Ambulatory Visit: Payer: Self-pay | Admitting: Neurology

## 2023-08-14 ENCOUNTER — Ambulatory Visit
Admission: RE | Admit: 2023-08-14 | Discharge: 2023-08-14 | Disposition: A | Discharge status: Home / Self Care | Source: Ambulatory Visit | Attending: Urology | Admitting: Urology

## 2023-08-14 DIAGNOSIS — R972 Elevated prostate specific antigen [PSA]: Secondary | ICD-10-CM

## 2023-08-14 DIAGNOSIS — N400 Enlarged prostate without lower urinary tract symptoms: Secondary | ICD-10-CM | POA: Diagnosis not present

## 2023-08-14 MED ORDER — GADOPICLENOL 0.5 MMOL/ML IV SOLN
10.0000 mL | Freq: Once | INTRAVENOUS | Status: AC | PRN
  Administered 2023-08-14: 10 mL via INTRAVENOUS

## 2023-08-24 DIAGNOSIS — N400 Enlarged prostate without lower urinary tract symptoms: Secondary | ICD-10-CM | POA: Diagnosis not present

## 2023-08-24 DIAGNOSIS — I10 Essential (primary) hypertension: Secondary | ICD-10-CM | POA: Diagnosis not present

## 2023-08-30 DIAGNOSIS — I10 Essential (primary) hypertension: Secondary | ICD-10-CM | POA: Diagnosis not present

## 2023-09-24 DIAGNOSIS — I10 Essential (primary) hypertension: Secondary | ICD-10-CM | POA: Diagnosis not present

## 2023-09-24 DIAGNOSIS — N400 Enlarged prostate without lower urinary tract symptoms: Secondary | ICD-10-CM | POA: Diagnosis not present

## 2023-09-29 DIAGNOSIS — I10 Essential (primary) hypertension: Secondary | ICD-10-CM | POA: Diagnosis not present

## 2023-10-24 DIAGNOSIS — I10 Essential (primary) hypertension: Secondary | ICD-10-CM | POA: Diagnosis not present

## 2023-10-24 DIAGNOSIS — N400 Enlarged prostate without lower urinary tract symptoms: Secondary | ICD-10-CM | POA: Diagnosis not present

## 2023-10-29 DIAGNOSIS — I10 Essential (primary) hypertension: Secondary | ICD-10-CM | POA: Diagnosis not present

## 2023-11-24 DIAGNOSIS — I10 Essential (primary) hypertension: Secondary | ICD-10-CM | POA: Diagnosis not present

## 2023-11-24 DIAGNOSIS — N400 Enlarged prostate without lower urinary tract symptoms: Secondary | ICD-10-CM | POA: Diagnosis not present

## 2023-11-28 DIAGNOSIS — I10 Essential (primary) hypertension: Secondary | ICD-10-CM | POA: Diagnosis not present

## 2023-12-18 DIAGNOSIS — R972 Elevated prostate specific antigen [PSA]: Secondary | ICD-10-CM | POA: Diagnosis not present

## 2023-12-24 DIAGNOSIS — I10 Essential (primary) hypertension: Secondary | ICD-10-CM | POA: Diagnosis not present

## 2023-12-24 DIAGNOSIS — N400 Enlarged prostate without lower urinary tract symptoms: Secondary | ICD-10-CM | POA: Diagnosis not present

## 2023-12-25 DIAGNOSIS — R972 Elevated prostate specific antigen [PSA]: Secondary | ICD-10-CM | POA: Diagnosis not present

## 2023-12-25 DIAGNOSIS — R3915 Urgency of urination: Secondary | ICD-10-CM | POA: Diagnosis not present

## 2023-12-25 DIAGNOSIS — N401 Enlarged prostate with lower urinary tract symptoms: Secondary | ICD-10-CM | POA: Diagnosis not present

## 2023-12-28 DIAGNOSIS — G4700 Insomnia, unspecified: Secondary | ICD-10-CM | POA: Diagnosis not present

## 2023-12-28 DIAGNOSIS — Z23 Encounter for immunization: Secondary | ICD-10-CM | POA: Diagnosis not present

## 2023-12-28 DIAGNOSIS — N529 Male erectile dysfunction, unspecified: Secondary | ICD-10-CM | POA: Diagnosis not present

## 2023-12-28 DIAGNOSIS — G40209 Localization-related (focal) (partial) symptomatic epilepsy and epileptic syndromes with complex partial seizures, not intractable, without status epilepticus: Secondary | ICD-10-CM | POA: Diagnosis not present

## 2023-12-28 DIAGNOSIS — I10 Essential (primary) hypertension: Secondary | ICD-10-CM | POA: Diagnosis not present

## 2023-12-28 DIAGNOSIS — R7303 Prediabetes: Secondary | ICD-10-CM | POA: Diagnosis not present

## 2023-12-28 DIAGNOSIS — G43909 Migraine, unspecified, not intractable, without status migrainosus: Secondary | ICD-10-CM | POA: Diagnosis not present

## 2023-12-28 DIAGNOSIS — I69320 Aphasia following cerebral infarction: Secondary | ICD-10-CM | POA: Diagnosis not present

## 2023-12-28 DIAGNOSIS — N400 Enlarged prostate without lower urinary tract symptoms: Secondary | ICD-10-CM | POA: Diagnosis not present

## 2024-06-10 ENCOUNTER — Ambulatory Visit: Admitting: Neurology

## 2024-06-13 ENCOUNTER — Ambulatory Visit: Admitting: Neurology
